# Patient Record
Sex: Male | Born: 1955 | Race: White | Hispanic: No | Smoking: Former smoker
Health system: Southern US, Community
[De-identification: ages and names within clinical notes are randomized; demographics above are authoritative.]

## PROBLEM LIST (undated history)

## (undated) DIAGNOSIS — F419 Anxiety disorder, unspecified: Secondary | ICD-10-CM

## (undated) DIAGNOSIS — I219 Acute myocardial infarction, unspecified: Secondary | ICD-10-CM

## (undated) DIAGNOSIS — C8590 Non-Hodgkin lymphoma, unspecified, unspecified site: Principal | ICD-10-CM

## (undated) DIAGNOSIS — N469 Male infertility, unspecified: Secondary | ICD-10-CM

## (undated) DIAGNOSIS — M545 Low back pain, unspecified: Secondary | ICD-10-CM

## (undated) DIAGNOSIS — I498 Other specified cardiac arrhythmias: Secondary | ICD-10-CM

## (undated) DIAGNOSIS — G43909 Migraine, unspecified, not intractable, without status migrainosus: Secondary | ICD-10-CM

## (undated) DIAGNOSIS — R296 Repeated falls: Secondary | ICD-10-CM

## (undated) DIAGNOSIS — G8929 Other chronic pain: Secondary | ICD-10-CM

## (undated) DIAGNOSIS — I10 Essential (primary) hypertension: Secondary | ICD-10-CM

## (undated) DIAGNOSIS — E559 Vitamin D deficiency, unspecified: Principal | ICD-10-CM

## (undated) DIAGNOSIS — G4733 Obstructive sleep apnea (adult) (pediatric): Secondary | ICD-10-CM

## (undated) DIAGNOSIS — I739 Peripheral vascular disease, unspecified: Secondary | ICD-10-CM

## (undated) DIAGNOSIS — M1990 Unspecified osteoarthritis, unspecified site: Secondary | ICD-10-CM

## (undated) DIAGNOSIS — R011 Cardiac murmur, unspecified: Secondary | ICD-10-CM

## (undated) DIAGNOSIS — I499 Cardiac arrhythmia, unspecified: Secondary | ICD-10-CM

## (undated) DIAGNOSIS — J309 Allergic rhinitis, unspecified: Secondary | ICD-10-CM

## (undated) DIAGNOSIS — F32A Depression, unspecified: Secondary | ICD-10-CM

## (undated) DIAGNOSIS — S069X9A Unspecified intracranial injury with loss of consciousness of unspecified duration, initial encounter: Secondary | ICD-10-CM

## (undated) DIAGNOSIS — M519 Unspecified thoracic, thoracolumbar and lumbosacral intervertebral disc disorder: Secondary | ICD-10-CM

## (undated) DIAGNOSIS — F329 Major depressive disorder, single episode, unspecified: Secondary | ICD-10-CM

## (undated) DIAGNOSIS — E785 Hyperlipidemia, unspecified: Secondary | ICD-10-CM

## (undated) DIAGNOSIS — E039 Hypothyroidism, unspecified: Secondary | ICD-10-CM

## (undated) DIAGNOSIS — F4310 Post-traumatic stress disorder, unspecified: Secondary | ICD-10-CM

## (undated) DIAGNOSIS — I2510 Atherosclerotic heart disease of native coronary artery without angina pectoris: Secondary | ICD-10-CM

## (undated) DIAGNOSIS — I779 Disorder of arteries and arterioles, unspecified: Secondary | ICD-10-CM

## (undated) DIAGNOSIS — I209 Angina pectoris, unspecified: Secondary | ICD-10-CM

## (undated) DIAGNOSIS — C8330 Diffuse large B-cell lymphoma, unspecified site: Secondary | ICD-10-CM

## (undated) DIAGNOSIS — K5730 Diverticulosis of large intestine without perforation or abscess without bleeding: Secondary | ICD-10-CM

## (undated) DIAGNOSIS — K219 Gastro-esophageal reflux disease without esophagitis: Secondary | ICD-10-CM

## (undated) HISTORY — DX: Diverticulosis of large intestine without perforation or abscess without bleeding: K57.30

## (undated) HISTORY — DX: Essential (primary) hypertension: I10

## (undated) HISTORY — DX: Post-traumatic stress disorder, unspecified: F43.10

## (undated) HISTORY — DX: Hypothyroidism, unspecified: E03.9

## (undated) HISTORY — DX: Male infertility, unspecified: N46.9

## (undated) HISTORY — PX: UMBILICAL HERNIA REPAIR: SHX196

## (undated) HISTORY — PX: SALIVARY GLAND SURGERY: SHX768

## (undated) HISTORY — PX: SHOULDER ARTHROSCOPY W/ ROTATOR CUFF REPAIR: SHX2400

## (undated) HISTORY — DX: Hyperlipidemia, unspecified: E78.5

## (undated) HISTORY — PX: RADICAL NECK DISSECTION: SHX2284

## (undated) HISTORY — DX: Low back pain: M54.5

## (undated) HISTORY — DX: Unspecified thoracic, thoracolumbar and lumbosacral intervertebral disc disorder: M51.9

## (undated) HISTORY — PX: TONSILLECTOMY: SUR1361

## (undated) HISTORY — PX: CORONARY ANGIOPLASTY WITH STENT PLACEMENT: SHX49

## (undated) HISTORY — DX: Vitamin D deficiency, unspecified: E55.9

## (undated) HISTORY — DX: Allergic rhinitis, unspecified: J30.9

## (undated) HISTORY — DX: Non-Hodgkin lymphoma, unspecified, unspecified site: C85.90

## (undated) HISTORY — DX: Low back pain, unspecified: M54.50

---
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
## 1979-08-25 HISTORY — PX: FINGER AMPUTATION: SHX636

## 1998-04-19 ENCOUNTER — Encounter: Admission: RE | Admit: 1998-04-19 | Discharge: 1998-04-19 | Payer: Self-pay | Admitting: Family Medicine

## 1998-05-03 ENCOUNTER — Encounter: Admission: RE | Admit: 1998-05-03 | Discharge: 1998-05-03 | Payer: Self-pay | Admitting: Sports Medicine

## 1998-06-02 ENCOUNTER — Encounter: Admission: RE | Admit: 1998-06-02 | Discharge: 1998-06-02 | Payer: Self-pay | Admitting: Sports Medicine

## 1998-06-23 ENCOUNTER — Encounter: Admission: RE | Admit: 1998-06-23 | Discharge: 1998-06-23 | Payer: Self-pay | Admitting: Family Medicine

## 1998-08-25 ENCOUNTER — Encounter: Admission: RE | Admit: 1998-08-25 | Discharge: 1998-08-25 | Payer: Self-pay | Admitting: Family Medicine

## 1998-09-06 ENCOUNTER — Encounter: Admission: RE | Admit: 1998-09-06 | Discharge: 1998-09-06 | Payer: Self-pay | Admitting: Family Medicine

## 1998-09-30 ENCOUNTER — Encounter: Admission: RE | Admit: 1998-09-30 | Discharge: 1998-09-30 | Payer: Self-pay | Admitting: Family Medicine

## 1999-01-12 ENCOUNTER — Encounter: Admission: RE | Admit: 1999-01-12 | Discharge: 1999-01-12 | Payer: Self-pay | Admitting: Family Medicine

## 1999-03-03 ENCOUNTER — Encounter: Admission: RE | Admit: 1999-03-03 | Discharge: 1999-03-03 | Payer: Self-pay | Admitting: Family Medicine

## 1999-04-05 ENCOUNTER — Encounter: Admission: RE | Admit: 1999-04-05 | Discharge: 1999-04-05 | Payer: Self-pay | Admitting: Family Medicine

## 1999-04-19 ENCOUNTER — Encounter: Admission: RE | Admit: 1999-04-19 | Discharge: 1999-04-19 | Payer: Self-pay | Admitting: Family Medicine

## 1999-06-09 ENCOUNTER — Encounter: Admission: RE | Admit: 1999-06-09 | Discharge: 1999-06-09 | Payer: Self-pay | Admitting: Family Medicine

## 1999-08-25 HISTORY — PX: SHOULDER ARTHROSCOPY W/ ROTATOR CUFF REPAIR: SHX2400

## 1999-09-22 ENCOUNTER — Emergency Department (HOSPITAL_COMMUNITY): Admission: EM | Admit: 1999-09-22 | Discharge: 1999-09-22 | Payer: Self-pay | Admitting: Emergency Medicine

## 1999-09-22 ENCOUNTER — Encounter: Payer: Self-pay | Admitting: Emergency Medicine

## 1999-10-02 ENCOUNTER — Encounter: Admission: RE | Admit: 1999-10-02 | Discharge: 1999-10-02 | Payer: Self-pay | Admitting: Family Medicine

## 2001-11-26 ENCOUNTER — Emergency Department (HOSPITAL_COMMUNITY): Admission: EM | Admit: 2001-11-26 | Discharge: 2001-11-26 | Payer: Self-pay | Admitting: Emergency Medicine

## 2003-07-14 ENCOUNTER — Emergency Department (HOSPITAL_COMMUNITY): Admission: EM | Admit: 2003-07-14 | Discharge: 2003-07-14 | Payer: Self-pay | Admitting: Emergency Medicine

## 2003-07-14 ENCOUNTER — Encounter: Payer: Self-pay | Admitting: Emergency Medicine

## 2004-04-14 ENCOUNTER — Ambulatory Visit (HOSPITAL_COMMUNITY): Admission: RE | Admit: 2004-04-14 | Discharge: 2004-04-14 | Payer: Self-pay | Admitting: Internal Medicine

## 2004-07-05 ENCOUNTER — Encounter: Admission: RE | Admit: 2004-07-05 | Discharge: 2004-07-05 | Payer: Self-pay | Admitting: Internal Medicine

## 2005-04-16 ENCOUNTER — Ambulatory Visit: Payer: Self-pay | Admitting: Internal Medicine

## 2005-04-24 ENCOUNTER — Ambulatory Visit: Payer: Self-pay | Admitting: Internal Medicine

## 2005-05-25 ENCOUNTER — Ambulatory Visit: Payer: Self-pay

## 2005-05-25 ENCOUNTER — Ambulatory Visit: Payer: Self-pay | Admitting: Internal Medicine

## 2005-11-05 ENCOUNTER — Ambulatory Visit: Payer: Self-pay | Admitting: Internal Medicine

## 2005-12-31 ENCOUNTER — Ambulatory Visit: Payer: Self-pay | Admitting: Internal Medicine

## 2006-02-28 ENCOUNTER — Ambulatory Visit: Payer: Self-pay | Admitting: Internal Medicine

## 2006-04-02 ENCOUNTER — Ambulatory Visit: Payer: Self-pay | Admitting: Gastroenterology

## 2006-04-12 ENCOUNTER — Ambulatory Visit: Payer: Self-pay | Admitting: Gastroenterology

## 2006-06-04 ENCOUNTER — Ambulatory Visit: Payer: Self-pay | Admitting: Internal Medicine

## 2006-06-04 ENCOUNTER — Encounter: Admission: RE | Admit: 2006-06-04 | Discharge: 2006-06-04 | Payer: Self-pay | Admitting: Internal Medicine

## 2006-08-27 ENCOUNTER — Ambulatory Visit: Payer: Self-pay | Admitting: Internal Medicine

## 2006-08-27 ENCOUNTER — Ambulatory Visit (HOSPITAL_COMMUNITY): Admission: RE | Admit: 2006-08-27 | Discharge: 2006-08-27 | Payer: Self-pay | Admitting: Internal Medicine

## 2006-11-28 ENCOUNTER — Ambulatory Visit: Payer: Self-pay | Admitting: Internal Medicine

## 2007-01-16 ENCOUNTER — Ambulatory Visit: Payer: Self-pay | Admitting: Internal Medicine

## 2007-01-16 LAB — CONVERTED CEMR LAB
MCHC: 34.6 g/dL (ref 30.0–36.0)
MCV: 84.1 fL (ref 78.0–100.0)
Platelets: 270 10*3/uL (ref 150–400)
RDW: 12.1 % (ref 11.5–14.6)
Total CK: 131 units/L (ref 7–195)

## 2007-02-27 ENCOUNTER — Ambulatory Visit: Payer: Self-pay | Admitting: Pulmonary Disease

## 2007-03-03 ENCOUNTER — Ambulatory Visit: Payer: Self-pay | Admitting: Internal Medicine

## 2007-03-19 ENCOUNTER — Ambulatory Visit: Payer: Self-pay | Admitting: Pulmonary Disease

## 2007-03-19 ENCOUNTER — Ambulatory Visit (HOSPITAL_BASED_OUTPATIENT_CLINIC_OR_DEPARTMENT_OTHER): Admission: RE | Admit: 2007-03-19 | Discharge: 2007-03-19 | Payer: Self-pay | Admitting: Pulmonary Disease

## 2007-04-08 ENCOUNTER — Ambulatory Visit: Payer: Self-pay | Admitting: Pulmonary Disease

## 2007-04-08 LAB — CONVERTED CEMR LAB
AST: 32 units/L (ref 0–37)
Albumin: 4.1 g/dL (ref 3.5–5.2)
Basophils Absolute: 0 10*3/uL (ref 0.0–0.1)
Bilirubin, Direct: 0.1 mg/dL (ref 0.0–0.3)
Chloride: 106 meq/L (ref 96–112)
Eosinophils Absolute: 0.1 10*3/uL (ref 0.0–0.6)
Eosinophils Relative: 1.7 % (ref 0.0–5.0)
Ferritin: 60.8 ng/mL (ref 22.0–322.0)
GFR calc Af Amer: 131 mL/min
GFR calc non Af Amer: 108 mL/min
Glucose, Bld: 88 mg/dL (ref 70–99)
HCT: 40.7 % (ref 39.0–52.0)
Iron: 87 ug/dL (ref 42–165)
Lymphocytes Relative: 29.7 % (ref 12.0–46.0)
MCHC: 34.1 g/dL (ref 30.0–36.0)
MCV: 86 fL (ref 78.0–100.0)
Neutro Abs: 2.9 10*3/uL (ref 1.4–7.7)
Neutrophils Relative %: 58.6 % (ref 43.0–77.0)
Platelets: 257 10*3/uL (ref 150–400)
Potassium: 4.4 meq/L (ref 3.5–5.1)
RBC: 4.73 M/uL (ref 4.22–5.81)
Saturation Ratios: 30.4 % (ref 20.0–50.0)
Sodium: 142 meq/L (ref 135–145)
TSH: 2.58 microintl units/mL (ref 0.35–5.50)
Transferrin: 204.5 mg/dL — ABNORMAL LOW (ref >=212.0)

## 2007-07-14 ENCOUNTER — Ambulatory Visit: Payer: Self-pay | Admitting: Internal Medicine

## 2007-07-23 ENCOUNTER — Encounter: Payer: Self-pay | Admitting: Internal Medicine

## 2007-07-23 DIAGNOSIS — I10 Essential (primary) hypertension: Secondary | ICD-10-CM

## 2007-07-23 DIAGNOSIS — E785 Hyperlipidemia, unspecified: Secondary | ICD-10-CM | POA: Insufficient documentation

## 2007-07-23 DIAGNOSIS — I2510 Atherosclerotic heart disease of native coronary artery without angina pectoris: Secondary | ICD-10-CM | POA: Insufficient documentation

## 2007-09-19 ENCOUNTER — Encounter: Payer: Self-pay | Admitting: Internal Medicine

## 2007-09-19 ENCOUNTER — Ambulatory Visit: Payer: Self-pay | Admitting: Internal Medicine

## 2007-09-19 LAB — CONVERTED CEMR LAB
Direct LDL: 82.7 mg/dL
Triglycerides: 219 mg/dL (ref 0–149)

## 2007-11-19 ENCOUNTER — Ambulatory Visit: Payer: Self-pay | Admitting: Internal Medicine

## 2007-11-24 ENCOUNTER — Telehealth: Payer: Self-pay | Admitting: Internal Medicine

## 2008-02-02 ENCOUNTER — Ambulatory Visit: Payer: Self-pay | Admitting: Internal Medicine

## 2008-02-02 DIAGNOSIS — I252 Old myocardial infarction: Secondary | ICD-10-CM | POA: Insufficient documentation

## 2008-02-02 DIAGNOSIS — K5730 Diverticulosis of large intestine without perforation or abscess without bleeding: Secondary | ICD-10-CM | POA: Insufficient documentation

## 2008-02-02 DIAGNOSIS — G4733 Obstructive sleep apnea (adult) (pediatric): Secondary | ICD-10-CM

## 2008-02-02 DIAGNOSIS — E039 Hypothyroidism, unspecified: Secondary | ICD-10-CM

## 2008-03-09 ENCOUNTER — Ambulatory Visit: Payer: Self-pay | Admitting: Internal Medicine

## 2008-03-18 ENCOUNTER — Ambulatory Visit: Payer: Self-pay | Admitting: Internal Medicine

## 2008-05-06 ENCOUNTER — Ambulatory Visit: Payer: Self-pay | Admitting: Internal Medicine

## 2008-05-06 DIAGNOSIS — N469 Male infertility, unspecified: Secondary | ICD-10-CM

## 2008-06-03 ENCOUNTER — Encounter: Payer: Self-pay | Admitting: Internal Medicine

## 2008-06-10 ENCOUNTER — Telehealth: Payer: Self-pay | Admitting: Internal Medicine

## 2008-09-06 ENCOUNTER — Telehealth: Payer: Self-pay | Admitting: Internal Medicine

## 2008-12-02 ENCOUNTER — Ambulatory Visit: Payer: Self-pay | Admitting: Internal Medicine

## 2008-12-16 ENCOUNTER — Telehealth: Payer: Self-pay | Admitting: Internal Medicine

## 2008-12-28 ENCOUNTER — Telehealth: Payer: Self-pay | Admitting: Internal Medicine

## 2009-01-11 ENCOUNTER — Telehealth (INDEPENDENT_AMBULATORY_CARE_PROVIDER_SITE_OTHER): Payer: Self-pay | Admitting: *Deleted

## 2009-01-13 ENCOUNTER — Encounter: Payer: Self-pay | Admitting: Internal Medicine

## 2009-01-13 ENCOUNTER — Telehealth (INDEPENDENT_AMBULATORY_CARE_PROVIDER_SITE_OTHER): Payer: Self-pay | Admitting: *Deleted

## 2009-01-20 ENCOUNTER — Telehealth: Payer: Self-pay | Admitting: Internal Medicine

## 2009-02-03 ENCOUNTER — Encounter: Payer: Self-pay | Admitting: Internal Medicine

## 2009-02-17 ENCOUNTER — Telehealth: Payer: Self-pay | Admitting: Internal Medicine

## 2009-03-08 ENCOUNTER — Ambulatory Visit: Payer: Self-pay | Admitting: Internal Medicine

## 2009-03-08 DIAGNOSIS — M2550 Pain in unspecified joint: Secondary | ICD-10-CM

## 2009-03-08 DIAGNOSIS — M25529 Pain in unspecified elbow: Secondary | ICD-10-CM

## 2009-03-08 DIAGNOSIS — M545 Low back pain: Secondary | ICD-10-CM | POA: Insufficient documentation

## 2009-03-08 DIAGNOSIS — M25569 Pain in unspecified knee: Secondary | ICD-10-CM | POA: Insufficient documentation

## 2009-03-08 LAB — CONVERTED CEMR LAB
AST: 24 units/L (ref 0–37)
Albumin: 4.3 g/dL (ref 3.5–5.2)
Alkaline Phosphatase: 75 units/L (ref 39–117)
Basophils Relative: 0.6 % (ref 0.0–3.0)
CO2: 28 meq/L (ref 19–32)
Calcium: 9 mg/dL (ref 8.4–10.5)
Chloride: 108 meq/L (ref 96–112)
Eosinophils Absolute: 0.1 10*3/uL (ref 0.0–0.7)
Glucose, Bld: 97 mg/dL (ref 70–99)
HCT: 41.4 % (ref 39.0–52.0)
Hemoglobin: 14.5 g/dL (ref 13.0–17.0)
Lymphocytes Relative: 27.3 % (ref 12.0–46.0)
Lymphs Abs: 1.6 10*3/uL (ref 0.7–4.0)
MCHC: 35.1 g/dL (ref 30.0–36.0)
Neutro Abs: 3.5 10*3/uL (ref 1.4–7.7)
Potassium: 4.3 meq/L (ref 3.5–5.1)
RBC: 4.68 M/uL (ref 4.22–5.81)
RDW: 12 % (ref 11.5–14.6)
Rhuematoid fact SerPl-aCnc: <20 intl units/mL (ref 0.0–20.0)
Sodium: 143 meq/L (ref 135–145)
TSH: 4.08 microintl units/mL (ref 0.35–5.50)
Total Protein: 7.1 g/dL (ref 6.0–8.3)

## 2009-08-09 ENCOUNTER — Ambulatory Visit: Payer: Self-pay | Admitting: Cardiology

## 2009-08-09 ENCOUNTER — Ambulatory Visit: Payer: Self-pay | Admitting: Internal Medicine

## 2009-12-19 ENCOUNTER — Ambulatory Visit: Payer: Self-pay | Admitting: Internal Medicine

## 2009-12-19 DIAGNOSIS — J309 Allergic rhinitis, unspecified: Secondary | ICD-10-CM

## 2009-12-21 ENCOUNTER — Encounter (INDEPENDENT_AMBULATORY_CARE_PROVIDER_SITE_OTHER): Payer: Self-pay | Admitting: *Deleted

## 2009-12-21 ENCOUNTER — Telehealth (INDEPENDENT_AMBULATORY_CARE_PROVIDER_SITE_OTHER): Payer: Self-pay | Admitting: *Deleted

## 2009-12-26 ENCOUNTER — Telehealth (INDEPENDENT_AMBULATORY_CARE_PROVIDER_SITE_OTHER): Payer: Self-pay | Admitting: *Deleted

## 2009-12-29 ENCOUNTER — Telehealth: Payer: Self-pay | Admitting: Internal Medicine

## 2010-01-02 ENCOUNTER — Telehealth: Payer: Self-pay | Admitting: Internal Medicine

## 2010-01-09 ENCOUNTER — Telehealth: Payer: Self-pay | Admitting: Internal Medicine

## 2010-01-09 ENCOUNTER — Ambulatory Visit: Payer: Self-pay | Admitting: Internal Medicine

## 2010-01-09 LAB — CONVERTED CEMR LAB
Calcium: 9.2 mg/dL (ref 8.4–10.5)
Creatinine, Ser: 0.8 mg/dL (ref 0.4–1.5)
Direct LDL: 93 mg/dL
HDL: 35.5 mg/dL — ABNORMAL LOW (ref >39.00)
Sodium: 143 meq/L (ref 135–145)
TSH: 4 microintl units/mL (ref 0.35–5.50)
Total CHOL/HDL Ratio: 5
Triglycerides: 361 mg/dL — ABNORMAL HIGH (ref 0.0–149.0)

## 2010-01-16 ENCOUNTER — Encounter: Payer: Self-pay | Admitting: Internal Medicine

## 2010-05-29 ENCOUNTER — Ambulatory Visit: Payer: Self-pay | Admitting: Internal Medicine

## 2010-05-29 LAB — CONVERTED CEMR LAB
AST: 31 units/L (ref 0–37)
Albumin: 4.5 g/dL (ref 3.5–5.2)
Alkaline Phosphatase: 84 units/L (ref 39–117)
Basophils Absolute: 0 10*3/uL (ref 0.0–0.1)
Basophils Relative: 0.4 % (ref 0.0–3.0)
Calcium: 9.2 mg/dL (ref 8.4–10.5)
Eosinophils Relative: 1.9 % (ref 0.0–5.0)
GFR calc non Af Amer: 96.99 mL/min (ref >60)
Hemoglobin: 14.4 g/dL (ref 13.0–17.0)
Lymphocytes Relative: 27.8 % (ref 12.0–46.0)
Monocytes Relative: 9.2 % (ref 3.0–12.0)
Neutro Abs: 3.5 10*3/uL (ref 1.4–7.7)
RBC: 4.74 M/uL (ref 4.22–5.81)
RDW: 13.4 % (ref 11.5–14.6)
Sodium: 143 meq/L (ref 135–145)
WBC: 5.8 10*3/uL (ref 4.5–10.5)

## 2010-05-31 ENCOUNTER — Telehealth: Payer: Self-pay | Admitting: Internal Medicine

## 2010-05-31 ENCOUNTER — Ambulatory Visit: Payer: Self-pay | Admitting: Cardiology

## 2010-05-31 DIAGNOSIS — I6529 Occlusion and stenosis of unspecified carotid artery: Secondary | ICD-10-CM

## 2010-06-01 ENCOUNTER — Telehealth (INDEPENDENT_AMBULATORY_CARE_PROVIDER_SITE_OTHER): Payer: Self-pay | Admitting: *Deleted

## 2010-06-05 ENCOUNTER — Encounter: Payer: Self-pay | Admitting: Internal Medicine

## 2010-06-05 ENCOUNTER — Other Ambulatory Visit: Admission: RE | Admit: 2010-06-05 | Discharge: 2010-06-05 | Payer: Self-pay | Admitting: Otolaryngology

## 2010-06-07 ENCOUNTER — Telehealth: Payer: Self-pay | Admitting: Internal Medicine

## 2010-06-09 ENCOUNTER — Encounter: Payer: Self-pay | Admitting: Internal Medicine

## 2010-06-12 ENCOUNTER — Telehealth: Payer: Self-pay | Admitting: Internal Medicine

## 2010-06-13 ENCOUNTER — Ambulatory Visit: Payer: Self-pay

## 2010-06-13 ENCOUNTER — Encounter: Payer: Self-pay | Admitting: Internal Medicine

## 2010-06-13 ENCOUNTER — Telehealth: Payer: Self-pay | Admitting: Internal Medicine

## 2010-06-16 ENCOUNTER — Telehealth: Payer: Self-pay | Admitting: Internal Medicine

## 2010-06-20 ENCOUNTER — Encounter: Payer: Self-pay | Admitting: Internal Medicine

## 2010-06-28 ENCOUNTER — Telehealth (INDEPENDENT_AMBULATORY_CARE_PROVIDER_SITE_OTHER): Payer: Self-pay | Admitting: *Deleted

## 2010-07-18 ENCOUNTER — Ambulatory Visit: Payer: Self-pay | Admitting: Internal Medicine

## 2010-07-18 ENCOUNTER — Inpatient Hospital Stay (HOSPITAL_COMMUNITY): Admission: AD | Admit: 2010-07-18 | Discharge: 2010-07-28 | Payer: Self-pay | Admitting: Internal Medicine

## 2010-07-23 ENCOUNTER — Ambulatory Visit: Payer: Self-pay | Admitting: Infectious Disease

## 2010-07-24 HISTORY — PX: DEEP NECK LYMPH NODE BIOPSY / EXCISION: SUR126

## 2010-07-26 ENCOUNTER — Encounter: Payer: Self-pay | Admitting: Internal Medicine

## 2010-07-31 ENCOUNTER — Encounter: Payer: Self-pay | Admitting: Internal Medicine

## 2010-08-07 ENCOUNTER — Encounter: Payer: Self-pay | Admitting: Internal Medicine

## 2010-08-23 ENCOUNTER — Encounter: Payer: Self-pay | Admitting: Internal Medicine

## 2010-09-04 ENCOUNTER — Ambulatory Visit: Payer: Self-pay | Admitting: Internal Medicine

## 2010-09-04 DIAGNOSIS — M5137 Other intervertebral disc degeneration, lumbosacral region: Secondary | ICD-10-CM

## 2010-09-04 DIAGNOSIS — K4090 Unilateral inguinal hernia, without obstruction or gangrene, not specified as recurrent: Secondary | ICD-10-CM

## 2010-09-04 DIAGNOSIS — F4310 Post-traumatic stress disorder, unspecified: Secondary | ICD-10-CM | POA: Insufficient documentation

## 2010-09-06 ENCOUNTER — Encounter: Payer: Self-pay | Admitting: Internal Medicine

## 2010-09-07 ENCOUNTER — Encounter: Payer: Self-pay | Admitting: Internal Medicine

## 2010-09-07 ENCOUNTER — Ambulatory Visit (HOSPITAL_COMMUNITY): Admission: RE | Admit: 2010-09-07 | Discharge: 2010-09-07 | Payer: Self-pay | Admitting: Otolaryngology

## 2010-09-13 ENCOUNTER — Encounter: Payer: Self-pay | Admitting: Internal Medicine

## 2010-09-15 ENCOUNTER — Ambulatory Visit: Payer: Self-pay | Admitting: Internal Medicine

## 2010-09-22 ENCOUNTER — Ambulatory Visit: Payer: Self-pay | Admitting: Internal Medicine

## 2010-10-03 ENCOUNTER — Ambulatory Visit: Payer: Self-pay | Admitting: Cardiology

## 2010-10-03 ENCOUNTER — Ambulatory Visit (HOSPITAL_COMMUNITY): Admission: RE | Admit: 2010-10-03 | Discharge: 2010-10-03 | Payer: Self-pay

## 2010-10-03 ENCOUNTER — Ambulatory Visit: Payer: Self-pay

## 2010-10-03 ENCOUNTER — Encounter: Payer: Self-pay | Admitting: Internal Medicine

## 2010-10-05 ENCOUNTER — Encounter: Payer: Self-pay | Admitting: Internal Medicine

## 2010-10-10 ENCOUNTER — Ambulatory Visit (HOSPITAL_COMMUNITY): Admission: RE | Admit: 2010-10-10 | Discharge: 2010-10-10 | Payer: Self-pay | Admitting: Otolaryngology

## 2010-10-12 ENCOUNTER — Encounter: Payer: Self-pay | Admitting: Internal Medicine

## 2010-10-18 ENCOUNTER — Telehealth (INDEPENDENT_AMBULATORY_CARE_PROVIDER_SITE_OTHER): Payer: Self-pay | Admitting: *Deleted

## 2010-10-19 ENCOUNTER — Encounter: Payer: Self-pay | Admitting: Internal Medicine

## 2010-10-19 ENCOUNTER — Inpatient Hospital Stay (HOSPITAL_COMMUNITY): Admission: RE | Admit: 2010-10-19 | Discharge: 2010-10-24 | Payer: Self-pay | Admitting: Otolaryngology

## 2010-10-19 ENCOUNTER — Encounter (INDEPENDENT_AMBULATORY_CARE_PROVIDER_SITE_OTHER): Payer: Self-pay | Admitting: Otolaryngology

## 2010-10-19 ENCOUNTER — Telehealth: Payer: Self-pay | Admitting: Internal Medicine

## 2010-10-24 ENCOUNTER — Ambulatory Visit: Payer: Self-pay | Admitting: Oncology

## 2010-10-25 ENCOUNTER — Encounter: Payer: Self-pay | Admitting: Internal Medicine

## 2010-10-25 LAB — CBC WITH DIFFERENTIAL/PLATELET
BASO%: 0.2 % (ref 0.0–2.0)
EOS%: 1.1 % (ref 0.0–7.0)
HCT: 37.5 % — ABNORMAL LOW (ref 38.4–49.9)
LYMPH%: 16.6 % (ref 14.0–49.0)
MCH: 29.4 pg (ref 27.2–33.4)
MCHC: 33.8 g/dL (ref 32.0–36.0)
MCV: 87 fL (ref 79.3–98.0)
MONO%: 7.9 % (ref 0.0–14.0)
NEUT%: 74.2 % (ref 39.0–75.0)
lymph#: 1.3 10*3/uL (ref 0.9–3.3)

## 2010-10-26 ENCOUNTER — Ambulatory Visit
Admission: RE | Admit: 2010-10-26 | Discharge: 2010-11-21 | Payer: Self-pay | Source: Home / Self Care | Admitting: Radiation Oncology

## 2010-10-26 LAB — HEPATITIS B SURFACE ANTIBODY,QUALITATIVE: Hep B S Ab: NEGATIVE

## 2010-10-26 LAB — COMPREHENSIVE METABOLIC PANEL
ALT: 26 U/L (ref 0–53)
AST: 26 U/L (ref 0–37)
Alkaline Phosphatase: 98 U/L (ref 39–117)
BUN: 7 mg/dL (ref 6–23)
Chloride: 100 mEq/L (ref 96–112)
Creatinine, Ser: 0.77 mg/dL (ref 0.40–1.50)
Total Bilirubin: 0.3 mg/dL (ref 0.3–1.2)

## 2010-10-26 LAB — BETA 2 MICROGLOBULIN, SERUM: Beta-2 Microglobulin: 1.68 mg/L (ref 1.01–1.73)

## 2010-10-26 LAB — HEPATITIS B SURFACE ANTIGEN: Hepatitis B Surface Ag: NEGATIVE

## 2010-10-26 LAB — HEPATITIS B CORE ANTIBODY, IGM: Hep B C IgM: NEGATIVE

## 2010-10-30 ENCOUNTER — Ambulatory Visit (HOSPITAL_COMMUNITY): Admission: RE | Admit: 2010-10-30 | Discharge: 2010-10-30 | Payer: Self-pay | Admitting: Otolaryngology

## 2010-10-31 ENCOUNTER — Encounter: Payer: Self-pay | Admitting: Internal Medicine

## 2010-11-01 ENCOUNTER — Ambulatory Visit: Payer: Self-pay | Admitting: Internal Medicine

## 2010-11-01 DIAGNOSIS — C8581 Other specified types of non-Hodgkin lymphoma, lymph nodes of head, face, and neck: Secondary | ICD-10-CM

## 2010-11-03 ENCOUNTER — Telehealth: Payer: Self-pay | Admitting: Internal Medicine

## 2010-11-06 ENCOUNTER — Ambulatory Visit (HOSPITAL_COMMUNITY): Admission: RE | Admit: 2010-11-06 | Discharge: 2010-11-06 | Payer: Self-pay | Admitting: Oncology

## 2010-11-07 ENCOUNTER — Ambulatory Visit (HOSPITAL_COMMUNITY): Admission: RE | Admit: 2010-11-07 | Discharge: 2010-11-07 | Payer: Self-pay | Admitting: Oncology

## 2010-11-08 ENCOUNTER — Encounter: Payer: Self-pay | Admitting: Internal Medicine

## 2010-11-08 LAB — COMPREHENSIVE METABOLIC PANEL
ALT: 17 U/L (ref 0–53)
Albumin: 3.8 g/dL (ref 3.5–5.2)
Alkaline Phosphatase: 83 U/L (ref 39–117)
Glucose, Bld: 96 mg/dL (ref 70–99)
Potassium: 4.4 mEq/L (ref 3.5–5.3)
Sodium: 141 mEq/L (ref 135–145)
Total Protein: 6.4 g/dL (ref 6.0–8.3)

## 2010-11-08 LAB — CBC WITH DIFFERENTIAL/PLATELET
Basophils Absolute: 0 10*3/uL (ref 0.0–0.1)
EOS%: 1.7 % (ref 0.0–7.0)
Eosinophils Absolute: 0.1 10*3/uL (ref 0.0–0.5)
HGB: 12.6 g/dL — ABNORMAL LOW (ref 13.0–17.1)
MCH: 28.6 pg (ref 27.2–33.4)
NEUT#: 4 10*3/uL (ref 1.5–6.5)
RDW: 13.2 % (ref 11.0–14.6)
lymph#: 1.1 10*3/uL (ref 0.9–3.3)

## 2010-11-14 ENCOUNTER — Encounter: Payer: Self-pay | Admitting: Internal Medicine

## 2010-11-20 ENCOUNTER — Encounter
Admission: RE | Admit: 2010-11-20 | Discharge: 2010-12-14 | Payer: Self-pay | Source: Home / Self Care | Attending: Oncology | Admitting: Oncology

## 2010-11-27 ENCOUNTER — Ambulatory Visit: Payer: Self-pay | Admitting: Oncology

## 2010-11-29 ENCOUNTER — Encounter: Payer: Self-pay | Admitting: Internal Medicine

## 2010-11-29 LAB — CBC WITH DIFFERENTIAL/PLATELET
Basophils Absolute: 0 10*3/uL (ref 0.0–0.1)
Eosinophils Absolute: 0 10*3/uL (ref 0.0–0.5)
HCT: 37.9 % — ABNORMAL LOW (ref 38.4–49.9)
HGB: 12.7 g/dL — ABNORMAL LOW (ref 13.0–17.1)
LYMPH%: 20.1 % (ref 14.0–49.0)
MCV: 84.2 fL (ref 79.3–98.0)
MONO#: 0.8 10*3/uL (ref 0.1–0.9)
MONO%: 13.8 % (ref 0.0–14.0)
NEUT#: 3.6 10*3/uL (ref 1.5–6.5)
NEUT%: 64.9 % (ref 39.0–75.0)
Platelets: 226 10*3/uL (ref 140–400)
WBC: 5.6 10*3/uL (ref 4.0–10.3)

## 2010-11-29 LAB — COMPREHENSIVE METABOLIC PANEL
ALT: 31 U/L (ref 0–53)
AST: 25 U/L (ref 0–37)
Alkaline Phosphatase: 105 U/L (ref 39–117)
BUN: 9 mg/dL (ref 6–23)
Calcium: 9.3 mg/dL (ref 8.4–10.5)
Chloride: 106 mEq/L (ref 96–112)
Creatinine, Ser: 0.72 mg/dL (ref 0.40–1.50)
Total Bilirubin: 0.3 mg/dL (ref 0.3–1.2)

## 2010-12-07 ENCOUNTER — Ambulatory Visit (HOSPITAL_COMMUNITY)
Admission: RE | Admit: 2010-12-07 | Discharge: 2010-12-07 | Payer: Self-pay | Source: Home / Self Care | Attending: Otolaryngology | Admitting: Otolaryngology

## 2010-12-07 ENCOUNTER — Encounter (INDEPENDENT_AMBULATORY_CARE_PROVIDER_SITE_OTHER): Payer: Self-pay | Admitting: Otolaryngology

## 2010-12-11 ENCOUNTER — Telehealth: Payer: Self-pay | Admitting: Internal Medicine

## 2010-12-20 ENCOUNTER — Encounter: Payer: Self-pay | Admitting: Internal Medicine

## 2010-12-20 LAB — COMPREHENSIVE METABOLIC PANEL
ALT: 28 U/L (ref 0–53)
Albumin: 4.1 g/dL (ref 3.5–5.2)
Alkaline Phosphatase: 91 U/L (ref 39–117)
CO2: 26 mEq/L (ref 19–32)
Glucose, Bld: 127 mg/dL — ABNORMAL HIGH (ref 70–99)
Potassium: 4.2 mEq/L (ref 3.5–5.3)
Sodium: 140 mEq/L (ref 135–145)
Total Protein: 6.5 g/dL (ref 6.0–8.3)

## 2010-12-20 LAB — CBC WITH DIFFERENTIAL/PLATELET
BASO%: 0.6 % (ref 0.0–2.0)
HCT: 38.6 % (ref 38.4–49.9)
LYMPH%: 13.4 % — ABNORMAL LOW (ref 14.0–49.0)
MCHC: 33.4 g/dL (ref 32.0–36.0)
MCV: 84.8 fL (ref 79.3–98.0)
MONO#: 0.8 10*3/uL (ref 0.1–0.9)
MONO%: 12.2 % (ref 0.0–14.0)
NEUT%: 72.3 % (ref 39.0–75.0)
Platelets: 195 10*3/uL (ref 140–400)
RBC: 4.55 10*6/uL (ref 4.20–5.82)

## 2010-12-20 LAB — URIC ACID: Uric Acid, Serum: 5 mg/dL (ref 4.0–7.8)

## 2010-12-22 ENCOUNTER — Telehealth (INDEPENDENT_AMBULATORY_CARE_PROVIDER_SITE_OTHER): Payer: Self-pay | Admitting: *Deleted

## 2010-12-29 ENCOUNTER — Encounter: Payer: Self-pay | Admitting: Internal Medicine

## 2011-01-02 ENCOUNTER — Ambulatory Visit
Admission: RE | Admit: 2011-01-02 | Discharge: 2011-01-02 | Payer: Self-pay | Source: Home / Self Care | Attending: Internal Medicine | Admitting: Internal Medicine

## 2011-01-08 ENCOUNTER — Ambulatory Visit (HOSPITAL_COMMUNITY)
Admission: RE | Admit: 2011-01-08 | Discharge: 2011-01-08 | Payer: Self-pay | Source: Home / Self Care | Attending: Otolaryngology | Admitting: Otolaryngology

## 2011-01-09 ENCOUNTER — Ambulatory Visit
Admission: RE | Admit: 2011-01-09 | Discharge: 2011-01-23 | Payer: Self-pay | Source: Home / Self Care | Attending: Radiation Oncology | Admitting: Radiation Oncology

## 2011-01-12 ENCOUNTER — Other Ambulatory Visit: Payer: Self-pay | Admitting: Oncology

## 2011-01-12 DIAGNOSIS — C8590 Non-Hodgkin lymphoma, unspecified, unspecified site: Secondary | ICD-10-CM

## 2011-01-23 NOTE — Letter (Signed)
Summary: [facility]  [facility]   Imported By: Sherian Rein 11/03/2010 08:14:13  _____________________________________________________________________  External Attachment:    Type:   Image     Comment:   External Document

## 2011-01-23 NOTE — Assessment & Plan Note (Signed)
Summary: suture removal per men, per men ami to removed sutures/#/cd   Vital Signs:  Patient profile:   55 year old male Height:      72 inches Weight:      275 pounds BMI:     37.43 O2 Sat:      97 % on Room air Temp:     97.5 degrees F oral Pulse rate:   72 / minute BP sitting:   144 / 74  (left arm) Cuff size:   regular  Vitals Entered By: Bill Salinas CMA (September 22, 2010 1:35 PM)  O2 Flow:  Room air CC: pt here for suture removal, pt was brought back and I  removed 6 superficial suture from the pt's upper back/ ab   Primary Care Keaja Reaume:  Norins  CC:  pt here for suture removal and pt was brought back and I  removed 6 superficial suture from the pt's upper back/ ab.  History of Present Illness: Patient presents for suture removal after excision of sebaceous cyst from his back. The wound has health nicely with no complications.  He does c/o decreased exercise which he attributes to being overweight. He reports that when playing with his grandson he gets to exhausted and short of breath that he must stop and rest. His recovery time is only a few minutes. Chart reviewed: last stress test in '04 with significant wall motion abnormality and estimated EF 45%. His last cardiology visit was surgical clearance in '06.  Current Medications (verified): 1)  Vytorin 10-40 Mg  Tabs (Ezetimibe-Simvastatin) .... At Bedtime 2)  Synthroid 100 Mcg  Tabs (Levothyroxine Sodium) .... Once Daily 3)  Oxycodone Hcl 5 Mg Tabs (Oxycodone Hcl) .Marland Kitchen.. 1 - 2 By Mouth Q 6 Hrs As Needed 4)  Budeprion Sr 150 Mg Xr12h-Tab (Bupropion Hcl) .... 2 Qdtab 5)  Trazodone Hcl 50 Mg  Tabs (Trazodone Hcl) .Marland Kitchen.. 1 or 2 By Mouth At Bedtime 6)  Citalopram Hydrobromide 20 Mg Tabs (Citalopram Hydrobromide) .Marland Kitchen.. 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) ACUTE LYMPHADENITIS (ICD-683) ALLERGIC RHINITIS CAUSE UNSPECIFIED (ICD-477.9) HEADACHE (ICD-784.0) FATIGUE (ICD-780.79) DYSPNEA (ICD-786.05) LOW BACK PAIN (ICD-724.2) ELBOW PAIN, RIGHT (ICD-719.42) KNEE PAIN, BILATERAL (ICD-719.46) POLYARTHRITIS (ICD-719.49) MALE INFERTILITY (ICD-606.9) OTHER DISEASES OF NASAL CAVITY AND SINUSES (ICD-478.19) POSTTRAUMATIC STRESS DISORDER (ICD-309.81) DIVERTICULOSIS, COLON (ICD-562.10) FAMILY HISTORY OF ALCOHOLISM/ADDICTION (ICD-V61.41) HYPOTHYROIDISM (ICD-244.9) MYOCARDIAL INFARCTION, HX OF (ICD-412) OBSTRUCTIVE SLEEP APNEA (ICD-327.23) GRIEF REACTION, ACUTE (ICD-309.0) HYPERTENSION (ICD-401.9) HYPERLIPIDEMIA (ICD-272.4) CORONARY ARTERY DISEASE (ICD-414.00)    Physician Roster:               ENT- Dr. Lazarus Salines               Pul - Dr. Craige Cotta  Past Surgical History: Last updated: 09/04/2010 Rotator cuff repair PTCB stent Inguinal herniorrhaphy I&D right neck for lympadenitis Aug '11   Family History: Last updated: 02/02/2008 mother with heart disease and breast cancer Family History of Alcoholism/Addiction  Social History: Last updated: 09/04/2010 [employer] - under 2 years - PTSD lives with SO. Just lost his son. Close to his 2 y/o grand-daughter Marriage is strained by loss Alcohol use-no Drug use-no Regular exercise-no  Review of Systems       The patient complains of weight gain, dyspnea on exertion, and enlarged lymph nodes.  The patient denies anorexia, weight loss, chest  pain, syncope, peripheral edema, abdominal pain, severe indigestion/heartburn, muscle weakness, and difficulty walking.         continued problem with enlargement and tenderness right submanibular region.   Physical Exam  General:  alert, well-developed, well-nourished, and well-hydrated.   Head:  normocephalic and atraumatic.   Neck:  swelling in the right submaniblar region with continue tenderness. Chest Wall:  no  deformities.   Lungs:  normal respiratory effort, normal breath sounds, no crackles, and no wheezes.   Heart:  normal rate, regular rhythm, and no rub.   Msk:  normal ROM.   Neurologic:  alert & oriented X3, cranial nerves II-XII intact, and gait normal.   Skin:  well healed wound from sebaceous cyst removal right upper back (tatoo lines aligned). Cervical Nodes:  enalrged and tender area of right submandibular, anterior cervical lymph nodes. Psych:  Oriented X3, normally interactive, and good eye contact.     Impression & Recommendations:  Problem # 1:  SEBACEOUS CYST (ICD-706.2) Healing nicely. Sutures removed  Problem # 2:  ACUTE LYMPHADENITIS (ICD-683) Recurrent and persistent lymphadenitis. He is followed closely by Dr. Lazarus Salines and is scheduled for surgical exicisonal biopsy of persistently enlarged node. He is now having a recurrent flare.  Plan - he is advised to contact Dr. Raye Sorrow office.  Problem # 3:  CORONARY ARTERY DISEASE (ICD-414.00) Patient with h/o CAD/MI and a cardiomyopathy at last testing in '04 now presenting with decreased exercise tolerance but not chest pain.  PLan - schedule for 2D echo to assess LV fxn - r/o progressive cardiomyopathy.             Further eval to be based on findings.              He is given precautions to report for any chest pain or discomfort or change in condition.  Orders: Cardiology Referral (Cardiology)  Complete Medication List: 1)  Vytorin 10-40 Mg Tabs (Ezetimibe-simvastatin) .... At bedtime 2)  Synthroid 100 Mcg Tabs (Levothyroxine sodium) .... Once daily 3)  Oxycodone Hcl 5 Mg Tabs (Oxycodone hcl) .Marland Kitchen.. 1 - 2 by mouth q 6 hrs as needed 4)  Budeprion Sr 150 Mg Xr12h-tab (Bupropion hcl) .... 2 qdtab 5)  Trazodone Hcl 50 Mg Tabs (Trazodone hcl) .Marland Kitchen.. 1 or 2 by mouth at bedtime 6)  Citalopram Hydrobromide 20 Mg Tabs (Citalopram hydrobromide) .Marland Kitchen.. 1 by mouth once daily

## 2011-01-23 NOTE — Letter (Signed)
Summary: [facility]  [facility]   Imported By: Lester [place] 08/29/2010 12:44:07  _____________________________________________________________________  External Attachment:    Type:   Image     Comment:   External Document

## 2011-01-23 NOTE — Assessment & Plan Note (Signed)
Summary: TICK BITE 5-25/ LYMPTH NODE IS STILL SWOLLEN/NWS   Vital Signs:  Patient profile:   55 year old male Height:      72 inches Weight:      266.25 pounds BMI:     36.24 O2 Sat:      97 % on Room air Temp:     97.1 degrees F oral Pulse rate:   63 / minute Pulse rhythm:   regular Resp:     16 per minute BP sitting:   142 / 80  (left arm) Cuff size:   large  Vitals Entered By: Rock Nephew CMA (May 29, 2010 9:20 AM)  Nutrition Counseling: Patient's BMI is greater than 25 and therefore counseled on weight management options.  O2 Flow:  Room air CC: tick bite 05/17/10 w/ swollen lymph Is Patient Diabetic? No Pain Assessment Patient in pain? no        Primary Care Provider:  Norins  CC:  tick bite 05/17/10 w/ swollen lymph.  History of Present Illness: New to me he complains of an enlarged mass on the right side of his neck/jaw for several weeks. There is rarely discomfort associated with this area but he does have an increased sense of salivation on the right side of his mouth. This associated with three tick bites on the neck, right leg, and left lower abd- two were medium sized ticks but one was a very small tick. There are no symptoms or rash in the areas of the tick bites.  Preventive Screening-Counseling & Management  Alcohol-Tobacco     Alcohol drinks/day: 0     >5/day in last 3 mos: no     Alcohol Counseling: not indicated; patient does not drink     Smoking Status: quit > 6 months     Smoking Cessation Counseling: no     Smoke Cessation Stage: quit     Packs/Day: 1.5     Year Started: 1969     Year Quit: 2001     Pack years: 40     Tobacco Counseling: to remain off tobacco products  Caffeine-Diet-Exercise     Does Patient Exercise: no      Sexual History:  currently monogamous.        Drug Use:  no.    Current Medications (verified): 1)  Vytorin 10-40 Mg  Tabs (Ezetimibe-Simvastatin) .... At Bedtime 2)  Synthroid 100 Mcg  Tabs (Levothyroxine  Sodium) .... Once Daily 3)  Oxycodone Hcl 5 Mg Tabs (Oxycodone Hcl) .Marland Kitchen.. 1 - 2 By Mouth Q 6 Hrs As Needed 4)  Budeprion Sr 150 Mg Xr12h-Tab (Bupropion Hcl) .... 2 Qdtab 5)  Trazodone Hcl 50 Mg  Tabs (Trazodone Hcl) .Marland Kitchen.. 1 or 2 By Mouth At Bedtime 6)  Citalopram Hydrobromide 20 Mg Tabs (Citalopram Hydrobromide) .Marland Kitchen.. 1 By Mouth Once Daily  Allergies (verified): No Known Drug Allergies  Past History:  Past Medical History: Reviewed history from 03/08/2009 and no changes required. Coronary artery disease Depression with ? paranoid schizophrenia in the past Hyperlipidemia Hypertension OSA Myocardial infarction, hx of Hypothyroidism ? FMS hx of nasal fx hx of skull fx hx of wrist fx hx of traumatic vetebral fx Diverticulosis, colon Anxiety hx of lumbar disc disease Low back pain - chronic  Past Surgical History: Reviewed history from 02/02/2008 and no changes required. Rotator cuff repair PTCB stent Inguinal herniorrhaphy  Family History: Reviewed history from 02/02/2008 and no changes required. mother with heart disease and breast cancer Family History of  Alcoholism/Addiction  Social History: Reviewed history from 02/02/2008 and no changes required. lives with SO. Just lost his son. Close to his 2 y/o grand-daughter Marriage is strained by loss Alcohol use-no Drug use-no Regular exercise-no Smoking Status:  quit > 6 months Packs/Day:  1.5 Sexual History:  currently monogamous Drug Use:  no Does Patient Exercise:  no  Review of Systems       The patient complains of enlarged lymph nodes.  The patient denies anorexia, fever, weight loss, weight gain, decreased hearing, hoarseness, chest pain, syncope, dyspnea on exertion, peripheral edema, prolonged cough, headaches, hemoptysis, abdominal pain, suspicious skin lesions, abnormal bleeding, and angioedema.   General:  Complains of sweats; denies chills, fatigue, fever, loss of appetite, malaise, sleep disorder, and  weight loss. ENT:  Denies decreased hearing, difficulty swallowing, ear discharge, earache, hoarseness, nasal congestion, nosebleeds, postnasal drainage, ringing in ears, sinus pressure, and sore throat. Resp:  Denies chest pain with inspiration, cough, coughing up blood, morning headaches, pleuritic, shortness of breath, sputum productive, and wheezing. Derm:  Complains of poor wound healing; denies changes in color of skin, changes in nail beds, dryness, excessive perspiration, flushing, hair loss, insect bite(s), itching, lesion(s), and rash. Endo:  Denies cold intolerance, excessive hunger, excessive thirst, excessive urination, heat intolerance, polyuria, and weight change. Heme:  Complains of enlarge lymph nodes; denies abnormal bruising, bleeding, fevers, pallor, and skin discoloration.  Physical Exam  General:  alert, well-developed, well-nourished, well-hydrated, appropriate dress, normal appearance, healthy-appearing, cooperative to examination, and overweight-appearing.   Head:  normocephalic, atraumatic, no abnormalities observed, no abnormalities palpated, and no alopecia.   Eyes:  vision grossly intact, pupils equal, pupils round, pupils reactive to light, and no injection.   Ears:  R ear normal and L ear normal.   Nose:  External nasal examination shows no deformity or inflammation. Nasal mucosa are pink and moist without lesions or exudates. Mouth:  Oral mucosa and oropharynx without lesions or exudates.  Teeth in good repair. Neck:  he has a firm, fixed mass that measures about 2 cm in the right posterior mandibular area, it is non-tender, non-fluctuant, and feel like the consistency of a lymph node vs salivary gland but it has atypical features as well. supple, full ROM, no thyromegaly, no thyroid nodules or tenderness, no JVD, normal carotid upstroke, no carotid bruits, no neck tenderness, and R neck mass.   Lungs:  normal respiratory effort, no intercostal retractions, no  accessory muscle use, normal breath sounds, no dullness, no fremitus, no crackles, and no wheezes.   Heart:  normal rate, regular rhythm, no murmur, no gallop, no rub, and no JVD.   Abdomen:  soft, non-tender, normal bowel sounds, no distention, no masses, no guarding, no rigidity, no rebound tenderness, no abdominal hernia, no inguinal hernia, no hepatomegaly, and no splenomegaly.   Msk:  normal ROM, no joint tenderness, no joint swelling, no joint warmth, no redness over joints, no joint deformities, no joint instability, no crepitation, and no muscle atrophy.   Pulses:  R and L carotid,radial,femoral,dorsalis pedis and posterior tibial pulses are full and equal bilaterally Extremities:  No clubbing, cyanosis, edema, or deformity noted with normal full range of motion of all joints.   Neurologic:  No cranial nerve deficits noted. Station and gait are normal. Plantar reflexes are down-going bilaterally. DTRs are symmetrical throughout. Sensory, motor and coordinative functions appear intact. Skin:  turgor normal, color normal, no rashes, no suspicious lesions, no ecchymoses, no petechiae, no purpura, no ulcerations, and no  edema.  there is no ECM and the sites of tick bite are unremarkable Axillary Nodes:  no R axillary adenopathy and no L axillary adenopathy.   Inguinal Nodes:  no R inguinal adenopathy and no L inguinal adenopathy.   Psych:  Cognition and judgment appear intact. Alert and cooperative with normal attention span and concentration. No apparent delusions, illusions, hallucinations   Impression & Recommendations:  Problem # 1:  ACUTE LYMPHADENITIS (ICD-683)  His updated medication list for this problem includes:    Doxycycline Hyclate 100 Mg Tabs (Doxycycline hyclate) ..... One by mouth two times a day for 14 days  Orders: Venipuncture (16109) TLB-BMP (Basic Metabolic Panel-BMET) (80048-METABOL) TLB-CBC Platelet - w/Differential (85025-CBCD) TLB-Hepatic/Liver Function Pnl  (80076-HEPATIC) TLB-CRP-High Sensitivity (C-Reactive Protein) (86140-FCRP) TLB-Sedimentation Rate (ESR) (85652-ESR) T-Lyme Disease (60454-09811) Prescription Created Electronically [phone])  Problem # 2:  TICK BITE (ICD-E906.4)  Orders: Venipuncture (29562) TLB-BMP (Basic Metabolic Panel-BMET) (80048-METABOL) TLB-CBC Platelet - w/Differential (85025-CBCD) TLB-Hepatic/Liver Function Pnl (80076-HEPATIC) TLB-CRP-High Sensitivity (C-Reactive Protein) (86140-FCRP) TLB-Sedimentation Rate (ESR) (85652-ESR) T-Lyme Disease (13086-57846) Prescription Created Electronically [phone])  Problem # 3:  HYPOTHYROIDISM (ICD-244.9)  His updated medication list for this problem includes:    Synthroid 100 Mcg Tabs (Levothyroxine sodium) ..... Once daily  Orders: TLB-TSH (Thyroid Stimulating Hormone) (84443-TSH)  Problem # 4:  NECK MASS (ICD-784.2) the lesion feels like it could be pathological and though he does not currently abuse EtOH or tobacco he has in the past so I think the mass should be screened with CT for malignant process. Orders: Radiology Referral (Radiology)  Complete Medication List: 1)  Vytorin 10-40 Mg Tabs (Ezetimibe-simvastatin) .... At bedtime 2)  Synthroid 100 Mcg Tabs (Levothyroxine sodium) .... Once daily 3)  Oxycodone Hcl 5 Mg Tabs (Oxycodone hcl) .Marland Kitchen.. 1 - 2 by mouth q 6 hrs as needed 4)  Budeprion Sr 150 Mg Xr12h-tab (Bupropion hcl) .... 2 qdtab 5)  Trazodone Hcl 50 Mg Tabs (Trazodone hcl) .Marland Kitchen.. 1 or 2 by mouth at bedtime 6)  Citalopram Hydrobromide 20 Mg Tabs (Citalopram hydrobromide) .Marland Kitchen.. 1 by mouth once daily 7)  Doxycycline Hyclate 100 Mg Tabs (Doxycycline hyclate) .... One by mouth two times a day for 14 days  Other Orders: Tdap => 33yrs IM (28413) Admin 1st Vaccine (24401)  Patient Instructions: 1)  Please schedule a follow-up appointment in 2 weeks. 2)  Take your antibiotic as prescribed until ALL of it is gone, but stop if you develop a rash or swelling and  contact our office as soon as possible. Prescriptions: DOXYCYCLINE HYCLATE 100 MG TABS (DOXYCYCLINE HYCLATE) One by mouth two times a day for 14 days  #28 x 1   Entered and Authorized by:   Etta Grandchild MD   Signed by:   Etta Grandchild MD on 05/29/2010   Method used:   Electronically to        [facility]  [address] [phone]* (retail)       [address]       [place], [place]  [address]       Ph: [phone] or [phone]       Fax: [phone]   RxID:   [id number]    Immunizations Administered:  Tetanus Vaccine:    Vaccine Type: Tdap    Site: right deltoid    Mfr: GlaxoSmithKline    Dose: 0.5 ml    Route: IM    Given by: Rock Nephew CMA    Exp. Date:  03/17/2012    Lot #: [id number]    VIS given: 11/11/07 version given May 29, 2010.

## 2011-01-23 NOTE — Letter (Signed)
Summary: [facility]  [facility]   Imported By: Lennie Odor 11/17/2010 14:37:02  _____________________________________________________________________  External Attachment:    Type:   Image     Comment:   External Document

## 2011-01-23 NOTE — Letter (Signed)
Summary: [facility] note/ Flo Shanks MD  [facility] note/ Flo Shanks MD   Imported By: Lester [place] 11/06/2010 09:47:12  _____________________________________________________________________  External Attachment:    Type:   Image     Comment:   External Document

## 2011-01-23 NOTE — Consult Note (Signed)
Summary: [facility]  [facility]   Imported By: Lester [place] 11/21/2010 12:42:25  _____________________________________________________________________  External Attachment:    Type:   Image     Comment:   External Document

## 2011-01-23 NOTE — Letter (Signed)
Summary: Phone Message/[facility]  Phone Message/[facility]   Imported By: Sherian Rein 09/12/2010 10:51:51  _____________________________________________________________________  External Attachment:    Type:   Image     Comment:   External Document

## 2011-01-23 NOTE — Consult Note (Signed)
Summary: [facility]  [facility]   Imported By: Sherian Rein 08/07/2010 12:00:49  _____________________________________________________________________  External Attachment:    Type:   Image     Comment:   External Document

## 2011-01-23 NOTE — Progress Notes (Signed)
Summary: LETTER  Phone Note Call from Patient Call back at [facility] Phone [phone]   Summary of Call: At last office visit pt requested a second oppinion letter regarding his PTSD for the [place]. They would like to know the status of this letter.  Initial call taken by: Lamar Sprinkles, CMA,  January 09, 2010 2:21 PM  Follow-up for Phone Call        Done - waiting on return from transcription, should go out soon  Follow-up by: Jacques Navy MD,  January 09, 2010 5:19 PM  Additional Follow-up for Phone Call Additional follow up Details #1::        call pt and informed him that letterwas up front to be picked up at his convience. Additional Follow-up by: Ami Bullins CMA,  January 10, 2010 9:12 AM

## 2011-01-23 NOTE — Letter (Signed)
Summary: [facility]  [facility]   Imported By: Lester [place] 08/04/2010 10:50:09  _____________________________________________________________________  External Attachment:    Type:   Image     Comment:   External Document

## 2011-01-23 NOTE — Progress Notes (Signed)
Summary: REPORTS  Phone Note Call from Patient   Summary of Call: Patient's wife called regarding request for copy of labs and ct report. Pt had ENT apt w/bx yesterday. They told pt to expect results possibly as soon as today. I requested that they call office or have ENT forward info to Dr Debby Bud so he will be aware. Wife says pt was surprised that Dr Debby Bud had not called him already regarding treatment. I advised her that MD was aware is interested in an update as soon as the biopsy results are back. Copy of report was left at front desk for patient to pick up.  Initial call taken by: Lamar Sprinkles, CMA,  June 07, 2010 9:27 AM  Follow-up for Phone Call        k

## 2011-01-23 NOTE — Letter (Signed)
   [facility] [address] [place], [place]  [address] Phone: [phone]      October 12, 2010   Michael Singleton [address] [place], [place] [address]  RE:  LAB RESULTS  Dear  [name],  The following is an interpretation of your most recent lab tests.  Please take note of any instructions provided or changes to medications that have resulted from your lab work.   2 D echo reveals a slightly diminished ejection fraction at 55%, normal being 60-65%. There are some wall motion changes that are most likely related to prior heart attack. NO acute change.    This is an OK study.  come see me if you have any questions about these lab results.   Sincerely Yours,    Jacques Navy MD

## 2011-01-23 NOTE — Letter (Signed)
   [facility] [address] [place], [place]  [address] Phone: [phone]      January 16, 2010   Michael Singleton [address] [place], [place] [address]  RE:  LAB RESULTS  Dear  Michael Singleton,  The following is an interpretation of your most recent lab tests.  Please take note of any instructions provided or changes to medications that have resulted from your lab work.    X-rays reveal multi-level mild to moderate degenrative disk disease.  Hip films are normal   Call or e-mail me if you have questions ([email or web address]).   Sincerely Yours,    Jacques Navy MD DG LUMBAR SPINE COMPLETE - [phone]   Clinical Data: Chronic low back pain and right hip pain   LUMBAR SPINE - COMPLETE 4+ VIEW   Comparison: Report of a prior study 07/14/2003   Findings: There are five non-rib bearing lumbar type vertebrae. There is disc narrowing at L1-02, L2-L3, and L4-L5.  Some of these changes were described on the prior study.   There is mild to moderate facet arthropathy.  No acute findings.   Soft tissues show heavy aortoiliac calcification without aneurysm.   IMPRESSION: 1.  Degenerative disc disease as above. 2.  Aortoiliac calcification. 3.  No acute findings.   Read By:  Burnett Sheng  DG HIP BILATERAL W/PELVIS - [id number]   Clinical Data: Low back and right hip pain   BILATERAL HIP WITH PELVIS - 4+ VIEW   Comparison: 07/05/2004   Findings: Normal alignment.  Intact bony pelvis and symmetric hips. No displaced fracture.  No significant arthritic change.  Symmetric trabecular pattern.  Normal SI joints.   IMPRESSION: No acute bony abnormality   Read By:  Sigurd Sos.,  M.D.

## 2011-01-23 NOTE — Letter (Signed)
Summary: Phone Message/[facility]  Phone Message/[facility]   Imported By: Sherian Rein 06/14/2010 15:02:19  _____________________________________________________________________  External Attachment:    Type:   Image     Comment:   External Document

## 2011-01-23 NOTE — Consult Note (Signed)
Summary: [facility]  [facility]   Imported By: Florinda Marker 08/16/2010 11:30:27  _____________________________________________________________________  External Attachment:    Type:   Image     Comment:   External Document

## 2011-01-23 NOTE — Progress Notes (Signed)
  Phone Note Refill Request   Refills Requested: Medication #1:  TRAZODONE HCL 50 MG  TABS 1 or 2 by mouth at bedtime  Medication #2:  VYTORIN 10-40 MG  TABS at bedtime  Medication #3:  SYNTHROID 100 MCG  TABS once daily  Medication #4:  CITALOPRAM HYDROBROMIDE 20 MG TABS 1 by mouth once daily. Pt also req refills for hydrocodone and welbutrin (not on med list). Req all to go to [facility]   Initial call taken by: Lamar Sprinkles, CMA,  December 29, 2009 2:54 PM  Follow-up for Phone Call        OK for refills. Wellbutrin = budeprion. Follow-up by: Jacques Navy MD,  December 29, 2009 5:35 PM  Additional Follow-up for Phone Call Additional follow up Details #1::        Hydrocodone is not on list, but oxycodone is Additional Follow-up by: Lamar Sprinkles, CMA,  December 29, 2009 6:07 PM    Prescriptions: CITALOPRAM HYDROBROMIDE 20 MG TABS (CITALOPRAM HYDROBROMIDE) 1 by mouth once daily  #90 x 3   Entered by:   Ami Bullins CMA   Authorized by:   Jacques Navy MD   Signed by:   Bill Salinas CMA on 01/02/2010   Method used:   Faxed to ...       [facility]             , [place]         Ph:        Fax: [phone]   RxID:   [id number] BUDEPRION SR 150 MG XR12H-TAB (BUPROPION HCL) 2 qdtab  #180 x 3   Entered by:   Ami Bullins CMA   Authorized by:   Jacques Navy MD   Signed by:   Bill Salinas CMA on 01/02/2010   Method used:   Faxed to ...       [facility] (mail-order)             , [place]         Ph:        Fax: [phone]   RxID:   [id number] SYNTHROID 100 MCG  TABS (LEVOTHYROXINE SODIUM) once daily  #90 x 3   Entered by:   Ami Bullins CMA   Authorized by:   Jacques Navy MD   Signed by:   Bill Salinas CMA on 01/02/2010   Method used:   Faxed to ...       [facility]             , [place]         Ph:        Fax: [phone]   RxID:   [id number] VYTORIN 10-40 MG  TABS (EZETIMIBE-SIMVASTATIN) at bedtime  #90 x 3   Entered by:   Ami Bullins  CMA   Authorized by:   Jacques Navy MD   Signed by:   Bill Salinas CMA on 01/02/2010   Method used:   Faxed to ...       [facility] (mail-order)             , [place]         Ph:        Fax: [phone]   RxID:   [id number]

## 2011-01-23 NOTE — Assessment & Plan Note (Signed)
Summary: skin tag-lb   Vital Signs:  Patient profile:   55 year old male Height:      72 inches Weight:      268 pounds BMI:     36.48 O2 Sat:      96 % on Room air Temp:     98.2 degrees F oral Pulse rate:   67 / minute BP sitting:   138 / 76  (left arm) Cuff size:   regular  Vitals Entered By: Bill Salinas CMA (September 15, 2010 1:41 PM)  O2 Flow:  Room air CC: pt here for skin tag removal/ ab Comments Pt is not taking Budeprion or oxycodone   Primary Care Provider:  Mitsuo Budnick  CC:  pt here for skin tag removal/ ab.  History of Present Illness: Patient presents for excision of several skin tags left neck that cause constant irritation. He is also presenting for excision of a sebaceous cyst on his back.   Current Medications (verified): 1)  Vytorin 10-40 Mg  Tabs (Ezetimibe-Simvastatin) .... At Bedtime 2)  Synthroid 100 Mcg  Tabs (Levothyroxine Sodium) .... Once Daily 3)  Oxycodone Hcl 5 Mg Tabs (Oxycodone Hcl) .Marland Kitchen.. 1 - 2 By Mouth Q 6 Hrs As Needed 4)  Budeprion Sr 150 Mg Xr12h-Tab (Bupropion Hcl) .... 2 Qdtab 5)  Trazodone Hcl 50 Mg  Tabs (Trazodone Hcl) .Marland Kitchen.. 1 or 2 By Mouth At Bedtime 6)  Citalopram Hydrobromide 20 Mg Tabs (Citalopram Hydrobromide) .Marland Kitchen.. 1 By Mouth Once Daily  Allergies (verified): No Known Drug Allergies   Complete Medication List: 1)  Vytorin 10-40 Mg Tabs (Ezetimibe-simvastatin) .... At bedtime 2)  Synthroid 100 Mcg Tabs (Levothyroxine sodium) .... Once daily 3)  Oxycodone Hcl 5 Mg Tabs (Oxycodone hcl) .Marland Kitchen.. 1 - 2 by mouth q 6 hrs as needed 4)  Budeprion Sr 150 Mg Xr12h-tab (Bupropion hcl) .... 2 qdtab 5)  Trazodone Hcl 50 Mg Tabs (Trazodone hcl) .Marland Kitchen.. 1 or 2 by mouth at bedtime 6)  Citalopram Hydrobromide 20 Mg Tabs (Citalopram hydrobromide) .Marland Kitchen.. 1 by mouth once daily  Other Orders: I&D Abscess, Simple / Single (10060) Removal of Skin Tags up to 15 Lesions (11200)   Procedure Note Last Tetanus: Tdap (05/29/2010)  Incision & Drainage: The  patient complains of irritation and discharge. Indication: infected lesion  Procedure # 1: I & D    Size (in cm): 3.0 x 3.0    Location: medial of right scapula    Comment: infected sebaceous cyst on back with discomfort. Informed consent obtained. In a sterile fashion using #15 blade, curved hemostats removed entire cyst capsule. No complications    Instrument used: #15 blade    Anesthesia: 2% xylocaine with epi    Closure: simple interrupted       # of superficial sutures: 6  Additional Instructions:  with soap and water. Cover with bandaide to avoid iritiation due to sutures. Return 1 week for suture removal.  Routine wound precautions given.  Skin Tag Removal: The patient complains of pain and irritation. Indication: inflamed lesion  Procedure # 1: snipped    Location: neck-left    # lesions removed: 4    Instrument used: scissors    Anesthesia: ethyl-chloride spray    Comment: informed consent obtained. Used silver nitrate stick for cautery.  Cleaned and prepped with: betadine Additional Instructions: routine wound precautions. may wash tonight.

## 2011-01-23 NOTE — Assessment & Plan Note (Signed)
Summary: f/u appt/#/cd   Vital Signs:  Patient profile:   55 year old male Height:      72 inches Weight:      266 pounds BMI:     36.21 O2 Sat:      99 % on Room air Temp:     98.4 degrees F oral Pulse rate:   55 / minute BP sitting:   142 / 82  (left arm) Cuff size:   regular  Vitals Entered By: Alysia Penna (September 04, 2010 1:14 PM)  O2 Flow:  Room air CC: pt is here for f/u visit. /cp sma   Primary Care Provider:  Norins  CC:  pt is here for f/u visit. /cp sma.  History of Present Illness: Reviewed hospital records and Dr. Raye Sorrow notes. He has finished his antibiotics and so far has been doing well. Reviewed all labs: EBV, Cat scratch fever, cocciodmycosis, sporthrix, fungi, TB, hepatitis B,A,C negative, CMV negative for acute infection, RPR neg, HIV negative.  He has irritated skin tags on his neck which he would like excised. He also has a recurrent sebaceous cyst on the right upper back.  SO wants his Vit D checked - he has no risk factors.  SO wants full thyroid work-up. Lab in July with TSH 2.07 - normal range on current dose of thyroid replacement.   Patient reports that when his inguinal hernia left is bulging out he will get a feeling of cold water running down his leg along with pain.  Patient needs a letter certifying his PTSD  Current Medications (verified): 1)  Vytorin 10-40 Mg  Tabs (Ezetimibe-Simvastatin) .... At Bedtime 2)  Synthroid 100 Mcg  Tabs (Levothyroxine Sodium) .... Once Daily 3)  Oxycodone Hcl 5 Mg Tabs (Oxycodone Hcl) .Marland Kitchen.. 1 - 2 By Mouth Q 6 Hrs As Needed 4)  Budeprion Sr 150 Mg Xr12h-Tab (Bupropion Hcl) .... 2 Qdtab 5)  Trazodone Hcl 50 Mg  Tabs (Trazodone Hcl) .Marland Kitchen.. 1 or 2 By Mouth At Bedtime 6)  Citalopram Hydrobromide 20 Mg Tabs (Citalopram Hydrobromide) .Marland Kitchen.. 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) ACUTE LYMPHADENITIS (ICD-683) ALLERGIC RHINITIS CAUSE UNSPECIFIED (ICD-477.9) HEADACHE (ICD-784.0) FATIGUE (ICD-780.79) DYSPNEA (ICD-786.05) LOW BACK PAIN (ICD-724.2) ELBOW PAIN, RIGHT (ICD-719.42) KNEE PAIN, BILATERAL (ICD-719.46) POLYARTHRITIS (ICD-719.49) MALE INFERTILITY (ICD-606.9) OTHER DISEASES OF NASAL CAVITY AND SINUSES (ICD-478.19) POSTTRAUMATIC STRESS DISORDER (ICD-309.81) DIVERTICULOSIS, COLON (ICD-562.10) FAMILY HISTORY OF ALCOHOLISM/ADDICTION (ICD-V61.41) HYPOTHYROIDISM (ICD-244.9) MYOCARDIAL INFARCTION, HX OF (ICD-412) OBSTRUCTIVE SLEEP APNEA (ICD-327.23) GRIEF REACTION, ACUTE (ICD-309.0) HYPERTENSION (ICD-401.9) HYPERLIPIDEMIA (ICD-272.4) CORONARY ARTERY DISEASE (ICD-414.00)    Physician Roster:               ENT- Dr. Lazarus Salines               Pul - Dr. Craige Cotta  Past Surgical History: Rotator cuff repair PTCB stent Inguinal herniorrhaphy I&D right neck for lympadenitis Aug '11   Family History: Reviewed history from 02/02/2008 and no changes required. mother with heart disease and breast cancer Family History of Alcoholism/Addiction  Social History: [employer] - under 2 years - PTSD lives with SO. Just lost his son. Close to his 2 y/o grand-daughter Marriage is strained by loss Alcohol use-no Drug use-no Regular exercise-no  Review of Systems       The patient complains of abdominal pain.  The patient denies anorexia, fever, weight  loss, decreased hearing, chest pain, syncope, dyspnea on exertion, prolonged cough, severe indigestion/heartburn, muscle weakness, suspicious skin lesions, and enlarged lymph nodes.    Physical Exam  General:  Heavyset white male in no distress Head:  normocephalic and atraumatic.   Eyes:  pupils equal and pupils round.  C&S cleaar Neck:  well healed surgical scar right neck. Small palpable firm lymph node right anterior cervical chain at the angle of the jaw. Not tender.    Lungs:  normal respiratory effort.   Heart:  normal rate and regular rhythm.   Abdomen:  soft.   Neurologic:  alert & oriented X3, cranial nerves II-XII intact, and gait normal.   Skin:  several small skin tags on neck, more left than right. 2.5 cm cystic structure right upper back in the wing of his tatoo. Soft, well encapsulated.  Psych:  Oriented X3, normally interactive, and good eye contact.     Impression & Recommendations:  Problem # 1:  ACUTE LYMPHADENITIS (ICD-683) Patient has completed a long course of antibiotics. All common and exotic cultures and serology is negative (see HPI). He has not had recurrent problems since stopping antibiotics. He is scheduled to see Dr. Lazarus Salines early October for follow-up.  Problem # 2:  POSTTRAUMATIC STRESS DISORDER (ICD-309.81) Letter prepared - see documents  Problem # 3:  HYPOTHYROIDISM (ICD-244.9) Labs reviewed: well controlled on present dose of synthroid. He has no risk factors for Vit D deficiency - screening is not indicated.   His updated medication list for this problem includes:    Synthroid 100 Mcg Tabs (Levothyroxine sodium) ..... Once daily  Problem # 4:  HYPERTENSION (ICD-401.9)  BP today: 142/82 Prior BP: 138/74 (07/18/2010)  Labs Reviewed: K+: 4.6 (05/29/2010) Creat: : 0.9 (05/29/2010)   Chol: 174 (01/09/2010)   HDL: 35.50 (01/09/2010)   LDL: DEL (09/19/2007)   TG: 361.0 (01/09/2010)  Adequate control on present medications.   Problem # 5:  INGUINAL HERNIA, LEFT (ICD-550.90) exam reveals a soft hernia  Plan - elective repair when his ENT eval/work-up is complete           he is educated as to signs of acute incarceration.   Problem # 6:  SEBACEOUS CYST (ICD-706.2) Patient will return for excision of sebaceous cyst and removal of skin tags.  Complete Medication List: 1)  Vytorin 10-40 Mg Tabs (Ezetimibe-simvastatin) .... At bedtime 2)  Synthroid 100 Mcg Tabs (Levothyroxine sodium) .... Once daily 3)  Oxycodone  Hcl 5 Mg Tabs (Oxycodone hcl) .Marland Kitchen.. 1 - 2 by mouth q 6 hrs as needed 4)  Budeprion Sr 150 Mg Xr12h-tab (Bupropion hcl) .... 2 qdtab 5)  Trazodone Hcl 50 Mg Tabs (Trazodone hcl) .Marland Kitchen.. 1 or 2 by mouth at bedtime 6)  Citalopram Hydrobromide 20 Mg Tabs (Citalopram hydrobromide) .Marland Kitchen.. 1 by mouth once daily

## 2011-01-23 NOTE — Progress Notes (Signed)
  Phone Note Other Incoming   Summary of Call: orders put in emr for xray Initial call taken by: Ami Bullins CMA,  January 09, 2010 2:05 PM

## 2011-01-23 NOTE — Consult Note (Signed)
Summary: [facility]  [facility]   Imported By: Lester Caribou 06/09/2010 10:20:50  _____________________________________________________________________  External Attachment:    Type:   Image     Comment:   External Document

## 2011-01-23 NOTE — Progress Notes (Signed)
----   Converted from flag ---- ---- 06/01/2010 8:44 AM, Edman Circle wrote: appt 6/21 @ 2:00  ---- 06/01/2010 8:41 AM, Dagoberto Reef wrote: pt need doppler, Thanks  ---- 05/31/2010 3:16 PM, Etta Grandchild MD wrote: The following orders have been entered for this patient and placed on Admin Hold:  Type:     Referral       Code:   ENT Description:   ENT Referral Order Date:   05/31/2010   Authorized By:   Etta Grandchild MD Order #:   [id number] Clinical Notes: ------------------------------

## 2011-01-23 NOTE — Initial Assessments (Signed)
Summary: RIGHT SIDE NECK SWOLLEN/CD   Vital Signs:  Patient profile:   55 year old male Height:      72 inches Weight:      266 pounds BMI:     36.21 O2 Sat:      97 % on Room air Temp:     98.1 degrees F oral Pulse rate:   62 / minute BP sitting:   138 / 74  (left arm) Cuff size:   large  Vitals Entered By: Bill Salinas CMA (July 18, 2010 9:47 AM)  O2 Flow:  Room air CC: pt here with c/o of swelling on right side of his neck/ ab   Primary Care Provider:  Zurii Hewes  CC:  pt here with c/o of swelling on right side of his neck/ ab.  History of Present Illness: Patient presentts for acute swelling of the right neck ove the past 12 hours. He has had difficulty with swallow and significant pain. He is now admitted for  IV anitbiotic and further diagnostic testing.   Current Medications (verified): 1)  Vytorin 10-40 Mg  Tabs (Ezetimibe-Simvastatin) .... At Bedtime 2)  Synthroid 100 Mcg  Tabs (Levothyroxine Sodium) .... Once Daily 3)  Oxycodone Hcl 5 Mg Tabs (Oxycodone Hcl) .Marland Kitchen.. 1 - 2 By Mouth Q 6 Hrs As Needed 4)  Budeprion Sr 150 Mg Xr12h-Tab (Bupropion Hcl) .... 2 Qdtab 5)  Trazodone Hcl 50 Mg  Tabs (Trazodone Hcl) .Marland Kitchen.. 1 or 2 By Mouth At Bedtime 6)  Citalopram Hydrobromide 20 Mg Tabs (Citalopram Hydrobromide) .Marland Kitchen.. 1 By Mouth Once Daily  Allergies (verified): No Known Drug Allergies  Past History:  Past Medical History: Last updated: 03/08/2009 Coronary artery disease Depression with ? paranoid schizophrenia in the past Hyperlipidemia Hypertension OSA Myocardial infarction, hx of Hypothyroidism ? FMS hx of nasal fx hx of skull fx hx of wrist fx hx of traumatic vetebral fx Diverticulosis, colon Anxiety hx of lumbar disc disease Low back pain - chronic  Past Surgical History: Last updated: 02/02/2008 Rotator cuff repair PTCB stent Inguinal herniorrhaphy  Family History: Last updated: 02/02/2008 mother with heart disease and breast cancer Family History of  Alcoholism/Addiction  Social History: Last updated: 05/29/2010 lives with SO. Just lost his son. Close to his 2 y/o grand-daughter Marriage is strained by loss Alcohol use-no Drug use-no Regular exercise-no  Risk Factors: Smoking Status: quit > 6 months (05/29/2010) Packs/Day: 1.5 (05/29/2010)  Review of Systems       The patient complains of enlarged lymph nodes.  The patient denies anorexia, fever, weight loss, weight gain, decreased hearing, hoarseness, syncope, peripheral edema, prolonged cough, hemoptysis, severe indigestion/heartburn, hematuria, incontinence, suspicious skin lesions, transient blindness, depression, and abnormal bleeding.    Physical Exam  General:  alert, well-developed, well-nourished, well-hydrated, and normal appearance.   Head:  normocephalic, atraumatic, and no abnormalities observed.   Eyes:  vision grossly intact, pupils equal, pupils round, corneas and lenses clear, and no injection.   Ears:  R EAC tight, TM normal. L EAC with cerumen Nose:  no external deformity and no external erythema.   Mouth:  dentures in place. No buccal lesions. No exudate from prorotid duct. Posterior pharynx clear.  Neck:  massive swelling of right face and neck extending from the ear to the neck down to the clavicle, obscuring the jaw line, anteriorly to the midline. The area is hot to the touch and very tender Chest Wall:  No deformities, masses, tenderness or gynecomastia noted. Lungs:  Normal respiratory effort,  chest expands symmetrically. Lungs are clear to auscultation, no crackles or wheezes. Heart:  Normal rate and regular rhythm. S1 and S2 normal without gallop, murmur, click, rub or other extra sounds. Abdomen:  Bowel sounds positive,abdomen soft and non-tender without masses, organomegaly or hernias noted. Rectal:  deferred Prostate:  deferred Msk:  missing distal phalanx of the left thumb. Otherwise normal Pulses:  2+ radial  Neurologic:  alert & oriented X3,  cranial nerves II-XII intact, strength normal in all extremities, gait normal, and DTRs symmetrical and normal.   Skin:  see neck exam. turgor normal, color normal, no rashes, and no suspicious lesions.   Cervical Nodes:  right side obscurred by swelling Psych:  Oriented X3, memory intact for recent and remote, and normally interactive.     Impression & Recommendations:  Problem # 1:  NECK MASS (ICD-784.2)  Patient with acute swelling of the area of the parotid and submandiublar glands with heat and tenderness. His swallowing is compromised. No difficulty with the airway.   Plan - admit to a non-tele bed          Lab - CBCD, Bmet          CT neck          IVF          Zosy IV stat  Orders: No Charge Patient Arrived (NCPA0) (NCPA0)  Problem # 2:  ACUTE LYMPHADENITIS (ICD-683) Patient had a thorough work-up with an inconclusive needle biopsy. He is to have excisional  biopsy in the near future to r/o lymphoma.  The following medications were removed from the medication list:    Doxycycline Hyclate 100 Mg Tabs (Doxycycline hyclate) ..... One by mouth two times a day for 14 days  Problem # 3:  HYPERTENSION (ICD-401.9) will continue home meds  Problem # 4:  HYPERLIPIDEMIA (ICD-272.4) Will continue home meds  His updated medication list for this problem includes:    Vytorin 10-40 Mg Tabs (Ezetimibe-simvastatin) .Marland Kitchen... At bedtime  Complete Medication List: 1)  Vytorin 10-40 Mg Tabs (Ezetimibe-simvastatin) .... At bedtime 2)  Synthroid 100 Mcg Tabs (Levothyroxine sodium) .... Once daily 3)  Oxycodone Hcl 5 Mg Tabs (Oxycodone hcl) .Marland Kitchen.. 1 - 2 by mouth q 6 hrs as needed 4)  Budeprion Sr 150 Mg Xr12h-tab (Bupropion hcl) .... 2 qdtab 5)  Trazodone Hcl 50 Mg Tabs (Trazodone hcl) .Marland Kitchen.. 1 or 2 by mouth at bedtime 6)  Citalopram Hydrobromide 20 Mg Tabs (Citalopram hydrobromide) .Marland Kitchen.. 1 by mouth once daily

## 2011-01-23 NOTE — Progress Notes (Signed)
Summary: PT IN SURGERY AT [facility] TODAY  Phone Note Call from Patient Call back at Home Phone [phone]   Caller: Wife Summary of Call: FYI - Wife called, pt went into surgery today at 8 am. Wife is req that you stop by to visit.  Initial call taken by: Lamar Sprinkles, CMA,  October 19, 2010 12:20 PM  Follow-up for Phone Call        will check hospital this evening if he is admitted.  Follow-up by: Jacques Navy MD,  October 19, 2010 1:23 PM

## 2011-01-23 NOTE — Progress Notes (Signed)
  Phone Note Outgoing Call   Reason for Call: Discuss lab or test results Summary of Call: called patient and his wife: cytopathology report: susupicious for atypical lymphoproliferative process, i.e. lymphoma. Discussed options of watchful waiting after antibiotics vs moving ahead with excisional biopsy. During discussion pt does admit to having night sweats in addition to generalized malaise. At close of discussion Mrs. Sass states that they will both be more comfortable knowing a definitive answer. They are scheduled to see Dr. Lazarus Salines Monday, June 27th and I suggest they express their wish for diagnostic excisional biopsy. We discussed briefly that if the node is lymphoma he will need a staging work-up followed by oncology consult. They are informed that the response rate to treatment is very positive.

## 2011-01-23 NOTE — Progress Notes (Signed)
Summary: Department of [employer] for records received from Department of [employer]. Request forwarded to [employer]. Dena Chavis  December 26, 2009 12:22 PM

## 2011-01-23 NOTE — Letter (Signed)
Summary: [facility]  [facility]   Imported By: Lennie Odor 06/23/2010 14:27:17  _____________________________________________________________________  External Attachment:    Type:   Image     Comment:   External Document

## 2011-01-23 NOTE — Progress Notes (Signed)
Summary: Refills-Norins pt  Phone Note Refill Request Message from:  Fax from Pharmacy on June 28, 2010 10:43 AM  Refills Requested: Medication #1:  BUDEPRION SR 150 MG XR12H-TAB 2 qdtab   Notes: [facility]  Medication #2:  TRAZODONE HCL 50 MG  TABS 1 or 2 by mouth at bedtime Initial call taken by: Lucious Groves,  June 28, 2010 10:43 AM  Follow-up for Phone Call        ok 2 ref each needs OV w/Dr Norins Follow-up by: Tresa Garter MD,  June 28, 2010 5:39 PM    Prescriptions: TRAZODONE HCL 50 MG  TABS (TRAZODONE HCL) 1 or 2 by mouth at bedtime  #180 x 1   Entered by:   Ami Bullins CMA   Authorized by:   Jacques Navy MD   Signed by:   Bill Salinas CMA on 06/29/2010   Method used:   Faxed to ...       [facility]  [address] [phone]* (retail)       [address]       [place], [place]  [address]       Ph: [phone] or [phone]       Fax: [phone]   RxID:   [id number] BUDEPRION SR 150 MG XR12H-TAB (BUPROPION HCL) 2 qdtab  #180 x 0   Entered by:   Bill Salinas CMA   Authorized by:   Jacques Navy MD   Signed by:   Bill Salinas CMA on 06/29/2010   Method used:   Faxed to ...       [facility]  [address] [phone]* (retail)       [address]       [place], [place]  [address]       Ph: [phone] or [phone]       Fax: [phone]   RxID:   [id number]

## 2011-01-23 NOTE — Consult Note (Signed)
Summary: [facility]  [facility]   Imported By: Sherian Rein 10/12/2010 11:26:20  _____________________________________________________________________  External Attachment:    Type:   Image     Comment:   External Document

## 2011-01-23 NOTE — Letter (Signed)
Summary: [facility]  [facility]   Imported By: Lester Midway 08/11/2010 10:13:33  _____________________________________________________________________  External Attachment:    Type:   Image     Comment:   External Document

## 2011-01-23 NOTE — Consult Note (Signed)
Summary: [facility]  [facility]   Imported By: Sherian Rein 09/19/2010 12:47:47  _____________________________________________________________________  External Attachment:    Type:   Image     Comment:   External Document

## 2011-01-23 NOTE — Consult Note (Signed)
Summary: [facility]  [facility]   Imported By: Lennie Odor 09/11/2010 14:45:28  _____________________________________________________________________  External Attachment:    Type:   Image     Comment:   External Document

## 2011-01-23 NOTE — Progress Notes (Signed)
Summary: FORM  Phone Note Call from Patient Call back at Home Phone [phone]   Caller: Michael Singleton/fiance---[phone] Call For: Michael Navy MD Summary of Call: Pt needs 2 handicap stickers. Please advise. Initial call taken by: Verdell Face,  November 03, 2010 9:53 AM  Follow-up for Phone Call        will be happy to complete short-term form. Got any? Usually one placard is good since they can move it from one vehicle to another. Follow-up by: Michael Navy MD,  November 03, 2010 12:55 PM  Additional Follow-up for Phone Call Additional follow up Details #1::        Pending MD's signature...............Marland KitchenLamar Sprinkles, CMA  November 03, 2010 6:10 PM     Additional Follow-up for Phone Call Additional follow up Details #2::    Pt informed to pick up Follow-up by: Lamar Sprinkles, CMA,  November 06, 2010 12:05 PM

## 2011-01-23 NOTE — Progress Notes (Signed)
Summary: [facility]  Phone Note Call from Patient   Summary of Call: See previous phone note. Patient is also req refills on trazodone and hydrocodone. In EMR it has oxycodone. Per pt, oxycodone did not work for him. Last rx pt has is hydrocodone 5/500 1 to 2 q 4 to 6 hours as needed #180. Ok to print and fax into [facility]?  Initial call taken by: Lamar Sprinkles, CMA,  January 02, 2010 10:21 AM  Follow-up for Phone Call        OK for Hydrocodone/APAP 5/500 1-2 q 6, #180 refill x 3. Please correct med list. THANKS Follow-up by: Jacques Navy MD,  January 02, 2010 2:34 PM  Additional Follow-up for Phone Call Additional follow up Details #1::        Pending signature Additional Follow-up by: Lamar Sprinkles, CMA,  January 02, 2010 3:28 PM    Additional Follow-up for Phone Call Additional follow up Details #2::    Faxed hydrocodone and trazodone to [facility]. Follow-up by: Lucious Groves,  January 03, 2010 11:11 AM  New/Updated Medications: TRAZODONE HCL 50 MG  TABS (TRAZODONE HCL) 1 or 2 by mouth at bedtime HYDROCODONE-ACETAMINOPHEN 5-500 MG TABS (HYDROCODONE-ACETAMINOPHEN) 1 to 2 q 6 hrs as needed pain Prescriptions: TRAZODONE HCL 50 MG  TABS (TRAZODONE HCL) 1 or 2 by mouth at bedtime  #180 x 1   Entered by:   Lamar Sprinkles, CMA   Authorized by:   Jacques Navy MD   Signed by:   Lamar Sprinkles, CMA on 01/02/2010   Method used:   Printed then faxed to ...       [facility] [address] [phone]* (retail)       [address]       [place], [place]  [address]       Ph: [phone] or [phone]       Fax: [phone]   RxID:   [id number] HYDROCODONE-ACETAMINOPHEN 5-500 MG TABS (HYDROCODONE-ACETAMINOPHEN) 1 to 2 q 6 hrs as needed pain  #180 x 3   Entered by:   Lamar Sprinkles, CMA   Authorized by:   Jacques Navy MD   Signed by:   Lamar Sprinkles, CMA on 01/02/2010   Method used:   Printed then faxed to ...       [facility]  [address] [phone]* (retail[facility]       [address]       [place], [place]  [address]       Ph: [phone] or [phone]       Fax: [phone]   RxID:   [id number]

## 2011-01-23 NOTE — Progress Notes (Signed)
Summary: UPDATE  Phone Note Call from Patient Call back at Home Phone [phone]   Caller: Jennette Kettle - Wife  Summary of Call: Pt's wife called w/update. Bx per [facility] ENT - No cancer. A report was faxed today from MD and is avail for your review.  Initial call taken by: Lamar Sprinkles, CMA,  June 13, 2010 4:04 PM

## 2011-01-23 NOTE — Progress Notes (Signed)
  Phone Note Outgoing Call   Reason for Call: Discuss lab or test results Summary of Call: Please call Dr. Raye Sorrow office in regard to path report on needle biopsy of lymph node Thanks (let him know tht I am the PCP) Initial call taken by: Jacques Navy MD,  June 12, 2010 9:02 PM  Follow-up for Phone Call        Spoke w/Dr [facility] office, they will fax info today Follow-up by: Lamar Sprinkles, CMA,  June 13, 2010 1:26 PM

## 2011-01-23 NOTE — Assessment & Plan Note (Signed)
Summary: SORE THROAT--AND DISCUSSION  STC   Vital Signs:  Patient profile:   55 year old male Height:      72 inches Weight:      265 pounds BMI:     36.07 O2 Sat:      97 % on Room air Temp:     97.2 degrees F oral Pulse rate:   60 / minute BP sitting:   158 / 80  (left arm) Cuff size:   regular  Vitals Entered By: Bill Salinas CMA (November 01, 2010 11:34 AM)  O2 Flow:  Room air CC: pt here for follow up after surg/ ab   Primary Care Provider:  Norins  CC:  pt here for follow up after surg/ ab.  History of Present Illness: Patient presents for follow-up.He has a long saga involving the development of an enlarged lymph node right neck with subsequent infection and hospitalization. 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 Surgery was a radical neck dissection with excision of nodes, parotid, submandibular gland and right tonsil. He is now seeing oncology and is going to have chem and 18 courseXRT treatment. He is having some difficulty eating post-op secondary to facial nerve palsy post-op. He does c/o soreness at the right tonsillar fossa. he did see Dr. Lazarus Salines yesterday who did not see any signs of infections or healing problems.  Current Medications (verified): 1)  Vytorin 10-40 Mg  Tabs (Ezetimibe-Simvastatin) .... At Bedtime 2)  Synthroid 100 Mcg  Tabs (Levothyroxine Sodium) .... Once Daily 3)  Oxycodone Hcl 5 Mg Tabs (Oxycodone Hcl) .Marland Kitchen.. 1 - 2 By Mouth Q 6 Hrs As Needed 4)  Budeprion Sr 150 Mg Xr12h-Tab (Bupropion Hcl) .... 2 Qdtab 5)  Trazodone Hcl 50 Mg  Tabs (Trazodone Hcl) .Marland Kitchen.. 1 or 2 By Mouth At Bedtime 6)  Citalopram Hydrobromide 20 Mg Tabs (Citalopram Hydrobromide) .Marland Kitchen.. 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 Lazarus Salines               Pul - Dr. 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  The patient denies anorexia, weight loss, weight gain, chest pain, dyspnea on exertion, prolonged cough, and abdominal pain.    Physical Exam  General:  WNWD white male Head:  normocephalic.   Eyes:  pupils equal and pupils round.  C&S clear Mouth:  right facial droop and paralysis Neck:  healing surgical incision from behind the ear to the chin. No swellng. Lungs:  normal respiratory effort and normal breath sounds.   Heart:  normal rate and regular rhythm.   Abdomen:  soft and normal bowel sounds.  Msk:  no joint tenderness and no joint deformities.   Neurologic:  alert & oriented X3.  facial nerve palsy right Skin:  healing surgical incision right neck Psych:  Oriented X3, normally interactive, good eye contact, and not anxious appearing.     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 Offered encouragement and support. He can call at any time for problems or information.   Complete Medication List: 1)  Vytorin 10-40 Mg Tabs  (Ezetimibe-simvastatin) .... At bedtime 2)  Synthroid 100 Mcg Tabs (Levothyroxine sodium) .... Once daily 3)  Oxycodone Hcl 5 Mg Tabs (Oxycodone hcl) .Marland Kitchen.. 1 - 2 by mouth q 6 hrs as needed 4)  Budeprion Sr 150 Mg Xr12h-tab (Bupropion hcl) .... 2 qdtab 5)  Trazodone Hcl 50 Mg Tabs (Trazodone hcl) .Marland Kitchen.. 1 or 2 by mouth at bedtime 6)  Citalopram Hydrobromide 20 Mg Tabs (Citalopram hydrobromide) .Marland Kitchen.. 1 by mouth once daily Prescriptions: CITALOPRAM HYDROBROMIDE 20 MG TABS (CITALOPRAM HYDROBROMIDE) 1 by mouth once daily  #90 x 3   Entered by:   Ami Bullins CMA   Authorized by:   Jacques Navy MD   Signed by:   Bill Salinas CMA on 11/01/2010   Method used:   Faxed to ...       [facility] (mail-order)             , [place]         Ph:        Fax: [phone]   RxID:   [id number] SYNTHROID 100 MCG  TABS (LEVOTHYROXINE SODIUM) once daily  #90 x 3   Entered by:   Ami Bullins CMA   Authorized by:   Jacques Navy MD   Signed by:   Bill Salinas CMA on 11/01/2010   Method used:   Faxed to ...       [facility] (mail-order)             , [place]         Ph:        Fax: [phone]   RxID:   [id number] VYTORIN 10-40 MG  TABS (EZETIMIBE-SIMVASTATIN) at bedtime  #90 x 3   Entered by:   Ami Bullins CMA   Authorized by:   Jacques Navy MD   Signed by:   Bill Salinas CMA on 11/01/2010   Method used:   Faxed to ...       [facility] (mail-order)             , [place]         Ph:        Fax: [phone]   RxID:   [id number]    Orders Added: 1)  Est. Patient Level III [phone]

## 2011-01-23 NOTE — Progress Notes (Signed)
  Phone Note Outgoing Call Call back at [facility] Phone [phone]   Summary of Call: LA: please let him know that the CT scan showed a suspicious lesion on the right and blockages in the arteries of his neck, i have referred him to an ENT to get a biopsy done and for an u/s of the arteries Initial call taken by: Etta Grandchild MD,  May 31, 2010 3:31 PM  Follow-up for Phone Call        Pt called for results of tests, please call him at [phone]  Follow-up by: Verdell Face,  June 01, 2010 10:24 AM  Additional Follow-up for Phone Call Additional follow up Details #1::        Wife lm on triage requesting a call back  about lab and CT scan results. 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

## 2011-01-23 NOTE — Letter (Signed)
   [facility] [address] [place], [place]  [address] Phone: [phone]      January 16, 2010   RALPH BROUWER [address] [place], [place] [address]  RE:  LAB RESULTS  Dear  Mr. Clutter,  The following is an interpretation of your most recent lab tests.  Please take note of any instructions provided or changes to medications that have resulted from your lab work.  ELECTROLYTES:  Good - no changes needed  KIDNEY FUNCTION TESTS:  Good - no changes needed  LIPID PANEL:  Good - no changes needed Triglyceride: 361.0   Cholesterol: 174   LDL: DEL   HDL: 35.50   Chol/HDL%:  5  THYROID STUDIES:  Thyroid studies normal TSH: 4.00     DIABETIC STUDIES:  Good - no changes needed Blood Glucose: 87    LDL cholesterol (the BAD) 93 - excellent and at goal.    Call or e-mail me if you have questions ([email or web address]).   Sincerely Yours,    Jacques Navy MD

## 2011-01-23 NOTE — Progress Notes (Signed)
Summary: Records Request   Faxed [facility] to [place] [facility] ([phone]). Debby Freiberg  October 18, 2010 3:44 PM

## 2011-01-23 NOTE — Consult Note (Signed)
Summary: [facility]  [facility]   Imported By: Lester Woodside 11/06/2010 10:51:50  _____________________________________________________________________  External Attachment:    Type:   Image     Comment:   External Document

## 2011-01-23 NOTE — Miscellaneous (Signed)
Summary: Orders Update  Clinical Lists Changes  Orders: Added new Test order of Carotid Duplex (Carotid Duplex) - Signed 

## 2011-01-23 NOTE — Letter (Signed)
Summary: Generic Letter  [facility]  [address] [place], [place] [address]   Phone: [phone]  Fax: [phone]         09/04/2010  Re: Michael Singleton     DOB [birth year]     MRN # [id number]  To Whom It May Concern,  [name] has been my primary care patient for several years. 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 His symptoms included depression, low self-esteem, memory impairment, panic attacks, bouts of irrational anger and heightened startle reflex.  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 to be able to be employed and that this is a permanent condition.  Please offer him all due considerations in regard to 100% disability compensation. Thank you for you consideration.   Sincerely,   Illene Regulus MD

## 2011-01-24 ENCOUNTER — Ambulatory Visit: Payer: Medicare Other | Attending: Radiation Oncology | Admitting: Radiation Oncology

## 2011-01-24 DIAGNOSIS — L538 Other specified erythematous conditions: Secondary | ICD-10-CM | POA: Insufficient documentation

## 2011-01-24 DIAGNOSIS — Z510 Encounter for antineoplastic radiation therapy: Secondary | ICD-10-CM | POA: Insufficient documentation

## 2011-01-24 DIAGNOSIS — C8581 Other specified types of non-Hodgkin lymphoma, lymph nodes of head, face, and neck: Secondary | ICD-10-CM | POA: Insufficient documentation

## 2011-01-25 NOTE — Progress Notes (Signed)
  Phone Note Other Incoming   Request: Send information Summary of Call: Request for records received from [employer]. Request forwarded to [employer].     

## 2011-01-25 NOTE — Letter (Signed)
Summary: [facility]  [facility]   Imported By: Lennie Odor 01/08/2011 15:01:30  _____________________________________________________________________  External Attachment:    Type:   Image     Comment:   External Document

## 2011-01-25 NOTE — Letter (Signed)
Summary: [facility]  [facility]   Imported By: Sherian Rein 12/08/2010 11:07:26  _____________________________________________________________________  External Attachment:    Type:   Image     Comment:   External Document

## 2011-01-25 NOTE — Progress Notes (Signed)
  Phone Note Outgoing Call   Reason for Call: Get patient information Summary of Call: called Michael Singleton. he is doing ok. needs Rx - percocet. oxycodone/APAP 5/325 1 by mouth three times a day #90 Will have Ms. Bullins prepare and mail #30 day supply with 3 scripts [facility] [address] [facility] [place]. Initial call taken by: Jacques Navy MD,  December 11, 2010 6:48 PM  Follow-up for Phone Call        prescriptions [facility] [address] [place], [place] [address] Follow-up by: Ami Bullins CMA,  December 12, 2010 4:50 PM    New/Updated Medications: OXYCODONE-ACETAMINOPHEN 5-325 MG TABS (OXYCODONE-ACETAMINOPHEN) Take 1 tablet by mouth three times a day. Fill on or after 12/12/10 OXYCODONE-ACETAMINOPHEN 5-325 MG TABS (OXYCODONE-ACETAMINOPHEN) Take 1 tablet by mouth three times a day. Fill on or after 01/12/11 OXYCODONE-ACETAMINOPHEN 5-325 MG TABS (OXYCODONE-ACETAMINOPHEN) Take 1 tablet by mouth three times a day. Fill on or after 02/12/11 Prescriptions: OXYCODONE-ACETAMINOPHEN 5-325 MG TABS (OXYCODONE-ACETAMINOPHEN) Take 1 tablet by mouth three times a day. Fill on or after 02/12/11  #90 x 0   Entered by:   Rock Nephew CMA   Authorized by:   Jacques Navy MD   Signed by:   Rock Nephew CMA on 12/12/2010   Method used:   Printed then mailed to ...       [facility]  [address] [phone][facility] (retail)       [address]       [place], [place]  [address]       Ph: [phone] or [phone]       Fax: [phone]   RxID:   [id number] OXYCODONE-ACETAMINOPHEN 5-325 MG TABS (OXYCODONE-ACETAMINOPHEN) Take 1 tablet by mouth three times a day. Fill on or after 01/12/11  #90 x 0   Entered by:   Rock Nephew CMA   Authorized by:   Jacques Navy MD   Signed by:   Rock Nephew CMA on 12/12/2010   Method used:   Printed then mailed to ...       [facility]  [address] [phone]* (retail)       [address]       [place], [place]  [address]       Ph: [phone] or  [phone]       Fax: [phone]   RxID:   [id number] OXYCODONE-ACETAMINOPHEN 5-325 MG TABS (OXYCODONE-ACETAMINOPHEN) Take 1 tablet by mouth three times a day. Fill on or after 12/12/10  #90 x 0   Entered by:   Rock Nephew CMA   Authorized by:   Jacques Navy MD   Signed by:   Rock Nephew CMA on 12/12/2010   Method used:   Printed then mailed to ...       [facility]  [address] [phone][facility] ([facility]ail)       [address]       [place], [place]  [address]       Ph: [phone] or [phone]       Fax: [phone]   RxID:   [id number]

## 2011-01-25 NOTE — Consult Note (Signed)
Summary: [facility]  [facility]   Imported By: Sherian Rein 01/09/2011 10:01:50  _____________________________________________________________________  External Attachment:    Type:   Image     Comment:   External Document

## 2011-01-25 NOTE — Assessment & Plan Note (Signed)
Summary: CONGESTION/NWS   Vital Signs:  Patient profile:   55 year old male Height:      72 inches Weight:      266 pounds BMI:     36.21 O2 Sat:      97 % on Room air Temp:     98.0 degrees F oral Pulse rate:   78 / minute BP sitting:   128 / 72  (left arm) Cuff size:   regular  Vitals Entered By: Bill Salinas CMA (January 02, 2011 1:59 PM)  O2 Flow:  Room air CC: pt here with c/o cough (yellow mucous) with head congestion x 5 days/ Pt also wants to discuss smaller pupil in right eye/ ab   Primary Care Provider:  Norins  CC:  pt here with c/o cough (yellow mucous) with head congestion x 5 days/ Pt also wants to discuss smaller pupil in right eye/ ab.  History of Present Illness: Michael Singleton presents for persistent sinus congestion and rhinorrhea of a clear mucus. He reports that he has a hard time breathng at night due to nasal congestion.He has been using afrin with some relief. He has had no fever, chills, purulent drainage, productive cough, SOB.  He has completed 3 courses of CHOP for lymphoma and is soon to start consolidation XRT.  He did have carotid doppler Dec. 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 There was no evidence of dissection involving the right ICA. He has had no CNS sympotms.   Current Medications (verified): 1)  Vytorin 10-40 Mg  Tabs (Ezetimibe-Simvastatin) .... At Bedtime 2)  Synthroid 100 Mcg  Tabs (Levothyroxine Sodium) .... Once Daily 3)  Oxycodone-Acetaminophen 5-325 Mg Tabs (Oxycodone-Acetaminophen) .... Take 1 Tablet By Mouth Three Times A Day. Fill On or After 02/12/11 4)  Budeprion Sr 150 Mg Xr12h-Tab (Bupropion Hcl) .... 2 Qdtab 5)  Trazodone Hcl 50 Mg  Tabs (Trazodone Hcl) .Marland Kitchen.. 1 or 2 By Mouth At Bedtime 6)  Citalopram Hydrobromide 20 Mg Tabs (Citalopram Hydrobromide) .Marland Kitchen.. 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 Lazarus Salines               Pul - Dr. 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  The patient denies anorexia, fever, weight loss, decreased hearing, hoarseness, chest pain, dyspnea on exertion, prolonged cough, abdominal pain, muscle weakness, difficulty walking, depression, and enlarged lymph nodes.    Physical Exam  General:  WNWD white male who looks good: good color, good weight and good attitude. Head:  normocephalic and atraumatic.  No tenderness to percussion over the  frontal and maxillary sinuses. Eyes:  C&S clear.  Ears:  EACs clear, TMs normal Mouth:  Throat is clear.  Neck:  well healed surgical scar right neck with post-operative reduction in tissue mass.  Chest Wall:  no deformities.   Lungs:  normal respiratory effort, normal breath sounds, no crackles, and no wheezes.   Heart:  normal rate and regular rhythm.   Pulses:  2+ radial  Neurologic:  alert & oriented X3,  cranial nerves II-XII intact, strength normal in all extremities, and gait normal.   Skin:  turgor normal, color normal, no rashes, and no suspicious lesions.   Cervical Nodes:  no anterior cervical adenopathy and no posterior cervical adenopathy.   Psych:  Oriented X3, normally interactive, good eye contact, and not anxious appearing.     Impression & Recommendations:  Problem # 1:  ALLERGIC RHINITIS CAUSE UNSPECIFIED (ICD-477.9) Persistent problem. No evidence of infection.   Plan - trial of nasal inhaltinal steroid  - nasonex 2 inhalitions/nares once a day. If this works will call in Rx or Rx for fluticasone.   Complete Medication List: 1)  Vytorin 10-40 Mg Tabs (Ezetimibe-simvastatin) .... At bedtime 2)  Synthroid 100 Mcg Tabs (Levothyroxine sodium) .... Once daily 3)  Oxycodone-acetaminophen 5-325 Mg Tabs (Oxycodone-acetaminophen) .... Take 1 tablet by mouth three times a day. fill on or after 02/12/11 4)  Budeprion Sr 150 Mg Xr12h-tab (Bupropion hcl) .... 2 qdtab 5)  Trazodone Hcl 50 Mg Tabs (Trazodone hcl) .Marland Kitchen.. 1 or 2 by mouth at bedtime 6)  Citalopram Hydrobromide 20 Mg Tabs (Citalopram hydrobromide) .Marland Kitchen.. 1 by mouth once daily   Orders Added: 1)  Est. Patient Level III [[address]]

## 2011-02-06 ENCOUNTER — Encounter (HOSPITAL_BASED_OUTPATIENT_CLINIC_OR_DEPARTMENT_OTHER): Payer: Medicare Other | Admitting: Oncology

## 2011-02-06 DIAGNOSIS — Z452 Encounter for adjustment and management of vascular access device: Secondary | ICD-10-CM

## 2011-02-06 DIAGNOSIS — C8581 Other specified types of non-Hodgkin lymphoma, lymph nodes of head, face, and neck: Secondary | ICD-10-CM

## 2011-02-13 ENCOUNTER — Other Ambulatory Visit (HOSPITAL_COMMUNITY): Payer: Self-pay

## 2011-02-20 ENCOUNTER — Other Ambulatory Visit: Payer: Medicare Other

## 2011-02-20 ENCOUNTER — Ambulatory Visit: Payer: Medicare Other | Admitting: Internal Medicine

## 2011-02-20 ENCOUNTER — Ambulatory Visit (INDEPENDENT_AMBULATORY_CARE_PROVIDER_SITE_OTHER): Payer: Medicare Other | Admitting: Internal Medicine

## 2011-02-20 ENCOUNTER — Other Ambulatory Visit: Payer: Self-pay | Admitting: Internal Medicine

## 2011-02-20 ENCOUNTER — Encounter: Payer: Self-pay | Admitting: Internal Medicine

## 2011-02-20 DIAGNOSIS — E039 Hypothyroidism, unspecified: Secondary | ICD-10-CM

## 2011-02-20 DIAGNOSIS — I10 Essential (primary) hypertension: Secondary | ICD-10-CM

## 2011-02-20 DIAGNOSIS — C8581 Other specified types of non-Hodgkin lymphoma, lymph nodes of head, face, and neck: Secondary | ICD-10-CM

## 2011-02-20 DIAGNOSIS — E785 Hyperlipidemia, unspecified: Secondary | ICD-10-CM

## 2011-02-20 DIAGNOSIS — N469 Male infertility, unspecified: Secondary | ICD-10-CM

## 2011-02-20 LAB — TSH: TSH: 1.07 u[IU]/mL (ref 0.35–5.50)

## 2011-02-20 LAB — TESTOSTERONE: Testosterone: 247.94 ng/dL — ABNORMAL LOW (ref 350.00–890.00)

## 2011-02-20 LAB — BASIC METABOLIC PANEL
BUN: 8 mg/dL (ref 6–23)
Chloride: 102 mEq/L (ref 96–112)
Potassium: 4.8 mEq/L (ref 3.5–5.1)
Sodium: 139 mEq/L (ref 135–145)

## 2011-02-20 LAB — LDL CHOLESTEROL, DIRECT: Direct LDL: 83.8 mg/dL

## 2011-02-20 LAB — LIPID PANEL
Total CHOL/HDL Ratio: 4
VLDL: 47.8 mg/dL — ABNORMAL HIGH (ref 0.0–40.0)

## 2011-02-22 ENCOUNTER — Telehealth: Payer: Self-pay | Admitting: Internal Medicine

## 2011-02-22 ENCOUNTER — Encounter: Payer: Self-pay | Admitting: Internal Medicine

## 2011-03-01 NOTE — Progress Notes (Signed)
Summary: Letter inquiry  Phone Note Outgoing Call Call back at [facility] Phone [phone] Call back at Letter Request   Summary of Call: [facility] per HIPAA.Informed Pt that letter requested is ready.Requested callback as to whether Pt preference is to PU letter or have it mailed. Initial call taken by: Burnard Leigh [facility]),  February 22, 2011 10:04 AM  Follow-up for Phone Call        Mailed Follow-up by: Lamar Sprinkles, CMA,  February 22, 2011 4:03 PM

## 2011-03-01 NOTE — Assessment & Plan Note (Signed)
Summary: CONGESTION   --STC   Vital Signs:  Patient profile:   55 year old male Height:      72 inches Weight:      257 pounds BMI:     34.98 O2 Sat:      99 % on Room air Temp:     97.4 degrees F oral Pulse rate:   54 / minute BP sitting:   128 / 82  (left arm) Cuff size:   regular  Vitals Entered By: Bill Salinas CMA (February 20, 2011 11:37 AM)  O2 Flow:  Room air CC: pt here for evaluation of sinus and chest congestion x 2 weeks. Pt just finished a round of augmentin/ ab   Primary Care Provider:  Clarie Camey  CC:  pt here for evaluation of sinus and chest congestion x 2 weeks. Pt just finished a round of augmentin/ ab.  History of Present Illness: Michael Singleton is well known to me. He had completed his treatment for lymphoma and seems to be doing well. His hair is coming back.  Today he presents for sinus congestion. He has no fever or purulent drainage. He used afrin with some relief. He continues to have fullness and discomfort. No sore throat, no otalgia no swallowing difficulty.  He has brought with him informaatino pertaining to a letter he needs to submit to the [place] for coverage of cialis. 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 For additional proof of hypogonadism will need to check a testosterone level.  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) INGUINAL HERNIA, LEFT (ICD-550.90) DISC DISEASE, LUMBAR (ICD-722.52) OCCLUSION&STENOS CAROTID ART W/O MENTION INFARCT (ICD-433.10) ALLERGIC RHINITIS CAUSE UNSPECIFIED (ICD-477.9) LOW BACK PAIN (ICD-724.2) ELBOW PAIN, RIGHT (ICD-719.42) KNEE PAIN, BILATERAL (ICD-719.46) POLYARTHRITIS (ICD-719.49) MALE INFERTILITY (ICD-606.9) POSTTRAUMATIC STRESS DISORDER (ICD-309.81) DIVERTICULOSIS, COLON (ICD-562.10) FAMILY HISTORY OF ALCOHOLISM/ADDICTION (ICD-V61.41) HYPOTHYROIDISM (ICD-244.9) MYOCARDIAL  INFARCTION, HX OF (ICD-412) OBSTRUCTIVE SLEEP APNEA (ICD-327.23) HYPERTENSION (ICD-401.9) HYPERLIPIDEMIA (ICD-272.4) CORONARY ARTERY DISEASE (ICD-414.00)      Physician Roster:               ENT- Dr. Lazarus Salines               Pul - Dr. Craige Cotta  Past Surgical History: Last updated: 11/01/2010 Rotator cuff repair PTCB stent Inguinal herniorrhaphy I&D right neck for lympadenitis Aug '11  Radical neck dissection right - Oct '11 (Dr.Wolicki)  Family History: Last updated: 02/02/2008 mother with heart disease and breast cancer Family History of Alcoholism/Addiction  Social History: Last updated: 09/04/2010 [employer] Service - under 2 years - PTSD lives with SO. Just lost his son. Close to his 2 y/o grand-daughter Marriage is strained by loss Alcohol use-no Drug use-no Regular exercise-no  Review of Systems  The patient denies anorexia, fever, weight loss, vision loss, decreased hearing, chest pain, syncope, dyspnea on exertion, peripheral edema, headaches, abdominal pain, severe indigestion/heartburn, genital sores, muscle weakness, difficulty walking, depression, abnormal bleeding, enlarged lymph nodes, and angioedema.    Physical Exam  General:  alert, well-developed, well-nourished, and well-hydrated white male in no distress.   Head:  Area of the right neck and ear with post-surgical change - well healed. There is an area of baldness behind the right ear Eyes:  vision grossly intact, pupils equal, and pupils round.   Nose:  no external deformity, no external erythema, and no nasal discharge.   Mouth:  no oral lesions, throat clear Neck:  post-surgical changes right neck. Full ROM Lungs:  normal respiratory effort, normal breath sounds, no crackles, and no wheezes.   Heart:  normal rate and regular rhythm.   Msk:  no joint tenderness and no joint swelling.   Pulses:  2+ radial pulse Neurologic:  alert & oriented X3, cranial nerves II-XII intact, strength normal in all  extremities, and gait normal.   Skin:  turgor normal, color normal, and no suspicious lesions.  Alpecia in the area behind the right ear Cervical Nodes:  no anterior cervical adenopathy and no posterior cervical adenopathy.   Psych:  Oriented X3, memory intact for recent and remote, normally interactive, and good eye contact.     Impression & Recommendations:  Problem # 1:  ALLERGIC RHINITIS CAUSE UNSPECIFIED (ICD-477.9) Ptient with congestion of the sinus without signs of infection. He is very sensitive to decongestants.  Plan - trial of fluticasone spray daily  His updated medication list for this problem includes:    Fluticasone Propionate 50 Mcg/act Susp (Fluticasone propionate) .Marland Kitchen... 1 spray per nostril once daily for allergic rhinnitis1  Problem # 2:  MALE INFERTILITY (ICD-606.9) He has had an abnormal sperm analysis sugesting hypogonadism. He does have erectile dysfunction.  Plan - testosterone level  Orders: TLB-Testosterone, Total (84403-TESTO)  addendum: testosterone 247.9 (350-890)  Problem # 3:  HYPOTHYROIDISM (ICD-244.9) For lab with recommendations to follow.  His updated medication list for this problem includes:    Synthroid 100 Mcg Tabs (Levothyroxine sodium) ..... Once daily  Orders: TLB-TSH (Thyroid Stimulating Hormone) (84443-TSH)  Addendum - TSH is normal.  Will continue present medical regimen  Problem # 4:  HYPERTENSION (ICD-401.9)  Orders: TLB-BMP (Basic Metabolic Panel-BMET) (80048-METABOL)  BP today: 128/82 Prior BP: 128/72 (01/02/2011)  Good control. Lab results are normal.  Problem # 5:  HYPERLIPIDEMIA (ICD-272.4) for routine lab with recommendations to follow. His updated medication list for this problem includes:    Vytorin 10-40 Mg Tabs (Ezetimibe-simvastatin) .Marland Kitchen... At bedtime  Orders: TLB-Lipid Panel (80061-LIPID)  Addendum - excellent control of lipids, good risk reduction in regard to coronary disease.  Plan continue present  medication.  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 He will continue to follow with oncology and ENT.  Complete Medication List: 1)  Vytorin 10-40 Mg Tabs (Ezetimibe-simvastatin) .... At bedtime 2)  Synthroid 100 Mcg Tabs (Levothyroxine sodium) .... Once daily 3)  Oxycodone-acetaminophen 5-325 Mg Tabs (Oxycodone-acetaminophen) .... Take 1 tablet by mouth three times a day. fill on or after 02/12/11 4)  Budeprion Sr 150 Mg Xr12h-tab (Bupropion hcl) .... 2 qdtab 5)  Trazodone Hcl 50 Mg Tabs (Trazodone hcl) .Marland Kitchen.. 1 or 2 by mouth at bedtime 6)  Citalopram Hydrobromide 20 Mg Tabs (Citalopram hydrobromide) .Marland Kitchen.. 1 by mouth once daily 7)  Fluticasone Propionate 50 Mcg/act Susp (Fluticasone propionate) .Marland Kitchen.. 1 spray per nostril once daily for allergic rhinnitis1 8)  Promethazine-codeine 6.25-10 Mg/82ml Syrp (Promethazine-codeine) .Marland Kitchen.. 1 tsp q 6 hrs as needed cough.  Michael Singleton Note: All result statuses are Final unless otherwise noted.  Tests: (1) Testosterone, Total (TESTO)   Testosterone         [L]  247.94 ng/dL                132.44-010.27  Tests: (2) Lipid Panel (LIPID)   Cholesterol               143 mg/dL  0-200     ATP III Classification            Desirable:  < 200 mg/dL                    Borderline High:  200 - 239 mg/dL               High:  > = 240 mg/dL   Triglycerides        [H]  239.0 mg/dL                 2.1-308.6     Normal:  <150 mg/dL     Borderline High:  578 - 199 mg/dL   HDL                  [L]  46.96 mg/dL                 >29.52   VLDL Cholesterol     [H]  47.8 mg/dL                  8.4-13.2  CHO/HDL Ratio:  CHD Risk                             4                    Men          Women     1/2 Average Risk     3.4          3.3     Average Risk          5.0          4.4     2X Average Risk          9.6          7.1     3X Average Risk           15.0          11.0                           Tests: (3) BMP (METABOL)   Sodium                    139 mEq/L                   135-145   Potassium                 4.8 mEq/L                   3.5-5.1   Chloride                  102 mEq/L                   96-112   Carbon Dioxide            29 mEq/L                    19-32   Glucose                   86 mg/dL                    44-01   BUN  8 mg/dL                     7-82   Creatinine                0.9 mg/dL                   9.5-6.2   Calcium                   9.4 mg/dL                   1.3-08.6   GFR                       95.46 mL/min                >60.00  Tests: (4) TSH (TSH)   FastTSH                   1.07 uIU/mL                 0.35-5.50  Tests: (5) Cholesterol LDL - Direct (DIRLDL)  Cholesterol LDL - Direct                             83.8 mg/dL     Optimal:  <578 mg/dL     Near or Above Optimal:  100-129 mg/dL     Borderline High:  469-629 mg/dL     High:  528-413 mg/dL     Very High:  >244 mg/dL  Patient Instructions: 1)  Sinus congestion - if it is allergic rhinnits the nasal steroid spray may help, but it takes 3-5 days of use to see improvement. If the spray does not work it is OK to take generic sudafed 30mg  two times a day or three times a day.  2)  Cough - phenergan with codein 1 tsp every 6 hours as needed. if this makes you too drowsy then use it at night and during the day use robitussin Dm or the equivalent. Let me know if the cough doesn't respond to treatment.  3)  Impotence - will check a testosterone level today - if low we will have a very good case for approval of medication . Prescriptions: OXYCODONE-ACETAMINOPHEN 5-325 MG TABS (OXYCODONE-ACETAMINOPHEN) Take 1 tablet by mouth three times a day. Fill on or after 02/12/11  #90 x 0   Entered and Authorized by:   Jacques Navy MD   Signed by:   Jacques Navy MD on 02/20/2011   Method used:   Handwritten   RxID:    [id number] PROMETHAZINE-CODEINE 6.25-10 MG/5ML SYRP (PROMETHAZINE-CODEINE) 1 tsp q 6 hrs as needed cough.  #8 oz x 1   Entered and Authorized by:   Jacques Navy MD   Signed by:   Jacques Navy MD on 02/20/2011   Method used:   Handwritten   RxID:   [id number] FLUTICASONE PROPIONATE 50 MCG/ACT SUSP (FLUTICASONE PROPIONATE) 1 spray per nostril once daily for allergic rhinnitis1  #1 x 12   Entered and Authorized by:   Jacques Navy MD   Signed by:   Jacques Navy MD on 02/20/2011   Method used:   Electronically to        [facility]  [address] [phone]* (retail)       [address]  [place], [place]  [address]       Ph: [phone] or [phone]       Fax: [phone]   RxID:   [id number]    Orders Added: 1)  TLB-Testosterone, Total [84403-TESTO] 2)  TLB-Lipid Panel [80061-LIPID] 3)  TLB-BMP (Basic Metabolic Panel-BMET) [80048-METABOL] 4)  TLB-TSH (Thyroid Stimulating Hormone) [84443-TSH] 5)  Est. Patient Level IV [27253]

## 2011-03-01 NOTE — Letter (Signed)
Summary: Generic Letter  [facility]  [address] [place], [place] [address]   Phone: [phone]  Fax: [phone]            02/22/2011  Michael Singleton [address] [place], [place]  [address]  [place]  To Whom It May Conceern,  For several years I have been treating 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  Laboratory evaluation revealed hypospermia several years ago. He recently had a testosterone level checked which reveals hypogonaidsm with a testosterone level of 257 with normal range being 350-890 in our lab.  If I can provide further information please do not hesitate to contact me.   Sincerely,   Illene Regulus MD

## 2011-03-06 LAB — CBC
HCT: 36.2 % — ABNORMAL LOW (ref 39.0–52.0)
Hemoglobin: 12.4 g/dL — ABNORMAL LOW (ref 13.0–17.0)
MCH: 29.2 pg (ref 26.0–34.0)
MCHC: 34.3 g/dL (ref 30.0–36.0)
MCV: 85.2 fL (ref 78.0–100.0)
Platelets: 245 10*3/uL (ref 150–400)
RBC: 4.25 MIL/uL (ref 4.22–5.81)
RDW: 13.1 % (ref 11.5–15.5)
WBC: 4.9 10*3/uL (ref 4.0–10.5)

## 2011-03-06 LAB — GLUCOSE, CAPILLARY: Glucose-Capillary: 97 mg/dL (ref 70–99)

## 2011-03-06 LAB — PROTIME-INR
INR: 0.94 (ref 0.00–1.49)
Prothrombin Time: 12.8 s (ref 11.6–15.2)

## 2011-03-06 LAB — CHROMOSOME ANALYSIS, BONE MARROW

## 2011-03-06 LAB — BONE MARROW EXAM

## 2011-03-07 LAB — CBC
MCV: 85.7 fL (ref 78.0–100.0)
MCV: 85.8 fL (ref 78.0–100.0)
Platelets: 210 10*3/uL (ref 150–400)
Platelets: 220 10*3/uL (ref 150–400)
RDW: 13.1 % (ref 11.5–15.5)
RDW: 13.5 % (ref 11.5–15.5)
WBC: 11 10*3/uL — ABNORMAL HIGH (ref 4.0–10.5)
WBC: 7.8 10*3/uL (ref 4.0–10.5)

## 2011-03-07 LAB — BASIC METABOLIC PANEL
BUN: 8 mg/dL (ref 6–23)
Creatinine, Ser: 0.77 mg/dL (ref 0.4–1.5)
GFR calc non Af Amer: >60 mL/min (ref >60)

## 2011-03-07 LAB — SURGICAL PCR SCREEN: MRSA, PCR: NEGATIVE

## 2011-03-09 LAB — BASIC METABOLIC PANEL
CO2: 30 mEq/L (ref 19–32)
Calcium: 8.5 mg/dL (ref 8.4–10.5)
Chloride: 109 mEq/L (ref 96–112)
Glucose, Bld: 96 mg/dL (ref 70–99)
Sodium: 142 mEq/L (ref 135–145)

## 2011-03-09 LAB — AFB CULTURE WITH SMEAR (NOT AT ARMC)

## 2011-03-09 LAB — FUNGUS CULTURE W SMEAR: Fungal Smear: NONE SEEN

## 2011-03-09 LAB — CBC
HCT: 36.1 % — ABNORMAL LOW (ref 39.0–52.0)
Hemoglobin: 11.7 g/dL — ABNORMAL LOW (ref 13.0–17.0)
MCH: 28.8 pg (ref 26.0–34.0)
MCHC: 32.4 g/dL (ref 30.0–36.0)
MCV: 88.9 fL (ref 78.0–100.0)

## 2011-03-09 LAB — TISSUE CULTURE: Culture: NO GROWTH

## 2011-03-09 LAB — QUANTIFERON TB GOLD ASSAY (BLOOD): Quantiferon Nil Value: 0.01 IU/mL

## 2011-03-10 LAB — CBC
Platelets: 216 10*3/uL (ref 150–400)
RBC: 4.75 MIL/uL (ref 4.22–5.81)
RDW: 13.5 % (ref 11.5–15.5)
WBC: 9.5 10*3/uL (ref 4.0–10.5)

## 2011-03-10 LAB — BASIC METABOLIC PANEL
BUN: 6 mg/dL (ref 6–23)
Calcium: 9.3 mg/dL (ref 8.4–10.5)
Creatinine, Ser: 0.85 mg/dL (ref 0.4–1.5)
GFR calc Af Amer: >60 mL/min (ref >60)

## 2011-03-10 LAB — HIGH SENSITIVITY CRP: CRP, High Sensitivity: 61.5 mg/L — ABNORMAL HIGH

## 2011-03-10 LAB — MISCELLANEOUS TEST

## 2011-03-10 LAB — DIFFERENTIAL
Basophils Absolute: 0 10*3/uL (ref 0.0–0.1)
Eosinophils Absolute: 0 10*3/uL (ref 0.0–0.7)
Eosinophils Relative: 1 % (ref 0–5)
Lymphocytes Relative: 14 % (ref 12–46)
Lymphs Abs: 1.3 10*3/uL (ref 0.7–4.0)
Neutrophils Relative %: 79 % — ABNORMAL HIGH (ref 43–77)

## 2011-03-10 LAB — COCCIDIOIDES ANTIBODIES: Coccidioides Ab CF: <1:2 {titer}

## 2011-03-10 LAB — HEPATITIS PANEL, ACUTE
HCV Ab: NEGATIVE
Hepatitis B Surface Ag: NEGATIVE

## 2011-03-10 LAB — HISTOPLASMA ANTIGEN, URINE

## 2011-03-10 LAB — LACTATE DEHYDROGENASE: LDH: 188 U/L (ref 94–250)

## 2011-03-10 LAB — CULTURE, ROUTINE-ABSCESS: Culture: NO GROWTH

## 2011-03-10 LAB — EPSTEIN-BARR VIRUS VCA ANTIBODY PANEL: EBV VCA IgM: 0.12 {ISR}

## 2011-03-10 LAB — ANA: Anti Nuclear Antibody(ANA): NEGATIVE

## 2011-03-10 LAB — HIV ANTIBODY (ROUTINE TESTING W REFLEX): HIV: NONREACTIVE

## 2011-03-10 LAB — ANAEROBIC CULTURE

## 2011-03-10 LAB — ANGIOTENSIN CONVERTING ENZYME: Angiotensin-Converting Enzyme: 32 U/L (ref 8–52)

## 2011-03-10 LAB — CMV IGM: CMV IgM: 0.44 (ref <0.90)

## 2011-03-16 ENCOUNTER — Ambulatory Visit: Payer: Medicare Other | Attending: Radiation Oncology | Admitting: Radiation Oncology

## 2011-03-22 ENCOUNTER — Other Ambulatory Visit: Payer: Self-pay | Admitting: Oncology

## 2011-03-22 DIAGNOSIS — C8590 Non-Hodgkin lymphoma, unspecified, unspecified site: Secondary | ICD-10-CM

## 2011-03-27 ENCOUNTER — Other Ambulatory Visit: Payer: Self-pay | Admitting: Oncology

## 2011-03-27 DIAGNOSIS — C8590 Non-Hodgkin lymphoma, unspecified, unspecified site: Secondary | ICD-10-CM

## 2011-04-09 ENCOUNTER — Encounter: Payer: Self-pay | Admitting: Internal Medicine

## 2011-04-10 ENCOUNTER — Ambulatory Visit (INDEPENDENT_AMBULATORY_CARE_PROVIDER_SITE_OTHER): Payer: Medicare Other | Admitting: Internal Medicine

## 2011-04-10 DIAGNOSIS — M79609 Pain in unspecified limb: Secondary | ICD-10-CM

## 2011-04-10 DIAGNOSIS — M79601 Pain in right arm: Secondary | ICD-10-CM

## 2011-04-10 DIAGNOSIS — C8581 Other specified types of non-Hodgkin lymphoma, lymph nodes of head, face, and neck: Secondary | ICD-10-CM

## 2011-04-10 MED ORDER — GABAPENTIN 100 MG PO CAPS: 100.0000 mg | ORAL_CAPSULE | Freq: Every day | ORAL | Status: DC

## 2011-04-10 NOTE — Patient Instructions (Signed)
Arm and chest pain - suspect this may  Be nerve damage either from radiation or possibly disk disease or degenerative joint disease. Plan is to take gabapentin - in an upward titrating patter as presecribed.  Weight management - portion size, avoid useless calories.

## 2011-04-11 ENCOUNTER — Encounter: Payer: Self-pay | Admitting: Internal Medicine

## 2011-04-11 ENCOUNTER — Other Ambulatory Visit: Payer: Self-pay | Admitting: Oncology

## 2011-04-11 ENCOUNTER — Other Ambulatory Visit (HOSPITAL_COMMUNITY): Payer: Self-pay

## 2011-04-11 ENCOUNTER — Encounter (HOSPITAL_BASED_OUTPATIENT_CLINIC_OR_DEPARTMENT_OTHER): Payer: Medicare Other | Admitting: Oncology

## 2011-04-11 ENCOUNTER — Ambulatory Visit (HOSPITAL_COMMUNITY)
Admission: RE | Admit: 2011-04-11 | Discharge: 2011-04-11 | Disposition: A | Discharge status: Home / Self Care | Payer: Medicare Other | Source: Ambulatory Visit | Attending: Oncology | Admitting: Oncology

## 2011-04-11 ENCOUNTER — Other Ambulatory Visit: Payer: Self-pay | Admitting: Internal Medicine

## 2011-04-11 ENCOUNTER — Inpatient Hospital Stay (HOSPITAL_COMMUNITY)
Admission: RE | Admit: 2011-04-11 | Discharge: 2011-04-11 | Payer: Medicare Other | Source: Ambulatory Visit | Attending: Oncology | Admitting: Oncology

## 2011-04-11 DIAGNOSIS — M5412 Radiculopathy, cervical region: Secondary | ICD-10-CM

## 2011-04-11 DIAGNOSIS — C8581 Other specified types of non-Hodgkin lymphoma, lymph nodes of head, face, and neck: Secondary | ICD-10-CM

## 2011-04-11 DIAGNOSIS — I708 Atherosclerosis of other arteries: Secondary | ICD-10-CM | POA: Insufficient documentation

## 2011-04-11 DIAGNOSIS — C8590 Non-Hodgkin lymphoma, unspecified, unspecified site: Secondary | ICD-10-CM

## 2011-04-11 DIAGNOSIS — K4090 Unilateral inguinal hernia, without obstruction or gangrene, not specified as recurrent: Secondary | ICD-10-CM | POA: Insufficient documentation

## 2011-04-11 DIAGNOSIS — I2510 Atherosclerotic heart disease of native coronary artery without angina pectoris: Secondary | ICD-10-CM | POA: Insufficient documentation

## 2011-04-11 DIAGNOSIS — R1310 Dysphagia, unspecified: Secondary | ICD-10-CM | POA: Insufficient documentation

## 2011-04-11 DIAGNOSIS — I700 Atherosclerosis of aorta: Secondary | ICD-10-CM | POA: Insufficient documentation

## 2011-04-11 DIAGNOSIS — C8589 Other specified types of non-Hodgkin lymphoma, extranodal and solid organ sites: Secondary | ICD-10-CM | POA: Insufficient documentation

## 2011-04-11 DIAGNOSIS — R0602 Shortness of breath: Secondary | ICD-10-CM | POA: Insufficient documentation

## 2011-04-11 DIAGNOSIS — I6529 Occlusion and stenosis of unspecified carotid artery: Secondary | ICD-10-CM | POA: Insufficient documentation

## 2011-04-11 DIAGNOSIS — Z452 Encounter for adjustment and management of vascular access device: Secondary | ICD-10-CM

## 2011-04-11 DIAGNOSIS — R209 Unspecified disturbances of skin sensation: Secondary | ICD-10-CM | POA: Insufficient documentation

## 2011-04-11 DIAGNOSIS — M542 Cervicalgia: Secondary | ICD-10-CM | POA: Insufficient documentation

## 2011-04-11 DIAGNOSIS — R51 Headache: Secondary | ICD-10-CM | POA: Insufficient documentation

## 2011-04-11 DIAGNOSIS — Z9221 Personal history of antineoplastic chemotherapy: Secondary | ICD-10-CM | POA: Insufficient documentation

## 2011-04-11 DIAGNOSIS — Z923 Personal history of irradiation: Secondary | ICD-10-CM | POA: Insufficient documentation

## 2011-04-11 DIAGNOSIS — K5730 Diverticulosis of large intestine without perforation or abscess without bleeding: Secondary | ICD-10-CM | POA: Insufficient documentation

## 2011-04-11 LAB — CBC WITH DIFFERENTIAL/PLATELET
BASO%: 0.2 % (ref 0.0–2.0)
Basophils Absolute: 0 10*3/uL (ref 0.0–0.1)
EOS%: 1.5 % (ref 0.0–7.0)
Eosinophils Absolute: 0.1 10*3/uL (ref 0.0–0.5)
HCT: 38.4 % (ref 38.4–49.9)
HGB: 13 g/dL (ref 13.0–17.1)
LYMPH%: 16.4 % (ref 14.0–49.0)
MCH: 28.1 pg (ref 27.2–33.4)
MCHC: 33.9 g/dL (ref 32.0–36.0)
MCV: 83 fL (ref 79.3–98.0)
MONO#: 0.6 10*3/uL (ref 0.1–0.9)
MONO%: 15.1 % — ABNORMAL HIGH (ref 0.0–14.0)
NEUT#: 2.8 10*3/uL (ref 1.5–6.5)
NEUT%: 66.8 % (ref 39.0–75.0)
Platelets: 199 10*3/uL (ref 140–400)
RBC: 4.63 10*6/uL (ref 4.20–5.82)
RDW: 14.8 % — ABNORMAL HIGH (ref 11.0–14.6)
WBC: 4.3 10*3/uL (ref 4.0–10.3)
lymph#: 0.7 10*3/uL — ABNORMAL LOW (ref 0.9–3.3)

## 2011-04-11 LAB — CMP (CANCER CENTER ONLY)
ALT(SGPT): 32 U/L (ref 10–47)
AST: 29 U/L (ref 11–38)
Albumin: 3.9 g/dL (ref 3.3–5.5)
Alkaline Phosphatase: 68 U/L (ref 26–84)
Calcium: 8.8 mg/dL (ref 8.0–10.3)
Chloride: 99 mEq/L (ref 98–108)
Creat: 1 mg/dl (ref 0.6–1.2)
Potassium: 4.3 mEq/L (ref 3.3–4.7)

## 2011-04-11 MED ORDER — IOHEXOL 300 MG/ML  SOLN
125.0000 mL | Freq: Once | INTRAMUSCULAR | Status: AC | PRN
  Administered 2011-04-11: 125 mL via INTRAVENOUS

## 2011-04-11 NOTE — Progress Notes (Signed)
Subjective:    Patient ID: Michael Singleton, male    DOB: 19-Dec-1956, 55 y.o.   MRN: [id number]  HPIMr. Waage is well known to me. He is s/p cervical lymphoma with radical neck dissection followed by adjuvant chemotherapy and radiation therapy. He has been doing well. He is due for follow-up CT and PET scan.  He presents with the c/o a burning/tingling sensation in the right UE from neck to forearm. He has soreness in the right upper chest wall. He denies any history of injury, strain or other precipitating event. He has had no loss of strength or function. He has noted mild diffuse erythema of this area. Past Medical History  Diagnosis Date  . 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 Inguinal hernia, left   . Lumbar disc disease   . Occlusion and stenosis of carotid artery without mention of cerebral infarction   . Allergic rhinitis, cause unspecified   . Lumbago   . Pain in joint, upper arm   . Pain in joint, lower leg   . Pain in joint, multiple sites   . Male infertility, unspecified   . Posttraumatic stress disorder   . Diverticulosis of colon (without mention of hemorrhage)   . Alcoholism in family   . Unspecified hypothyroidism   . Old myocardial infarction   . Obstructive sleep apnea (adult) (pediatric)   . Unspecified essential hypertension   . Hyperlipemia   . Coronary atherosclerosis of unspecified type of vessel, native or graft    Past Surgical History  Procedure Date  . Rotator cuff repair   . Ptcb stent   . Inguinal hernia repair   . I&d right neck for lympadenitis     Aug '11  . Radical neck dissection right     Oct '11 (Dr Lazarus Salines)   Family History  Problem Relation Age of Onset  . Cancer Mother   . Heart disease Mother   . Alcohol abuse Other    History   Social History  . Marital Status: Single    Spouse Name: N/A    Number of Children: N/A  . Years of Education: N/A   Occupational History  . Not on file.   Social  History Main Topics  . Smoking status: Not on file  . Smokeless tobacco: Not on file  . Alcohol Use: Not on file  . Drug Use: Not on file  . Sexually Active: Not on file   Other Topics Concern  . 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  Lives with SO. They  lost her son in a motorcycle accident by a drunk driver.  Close to his 5 year old granddaughter.Marriage is in good health.        Review of Systems Review of Systems  Constitutional:  Negative for fever, chills, activity change and unexpected weight change.  HENT:  Negative for hearing loss, ear pain, congestion, neck stiffness and postnasal drip.   Eyes: Negative for pain, discharge and visual disturbance.  Respiratory: Negative for chest tightness and wheezing.   Cardiovascular: Negative for chest pain and palpitations.       [No decreased exercise tolerance Gastrointestinal: [No change in bowel habit. No bloating or gas. No reflux or indigestion Genitourinary: Negative for urgency, frequency, flank pain and difficulty urinating.  Musculoskeletal: Positive for myalgias RUE and upper chest. Negative for back pain, arthralgias and gait problem.  Neurological:  Negative for dizziness, tremors, weakness and headaches. Positive for tingling/paresthesia RUE Hematological: Negative for adenopathy.  Psychiatric/Behavioral: Negative for behavioral problems and dysphoric mood.       Objective:   Physical Exam WNWD robust appearing white male who has gained back much of the weight he has lost. HEENT- well healed surgical site right neck. C&S clear Chest - CTAP Cor - RRR, no murmurs Neuro - A&O x 4, CN II-XII grossly intact. Decreased sensation to light touch and pin prick proximal right UE at medial aspect; decreased sensation right upper outer chest. MS normal in RUE, grip, shoulder girdle       Assessment & Plan:  1. Right arm pain - patient with a mild radiculopathy with  sensation change on the right. Concern for DDD/DJD cervical spine vs post-radiation changes with nerve compression.  Plan -  At up-coming CT scan have asked radiologist to evaluate C-Spine for DDD/DJD and nerve root compression.            Gabapentin for pain: step-wise upward titration (see Rx)

## 2011-04-13 ENCOUNTER — Other Ambulatory Visit: Payer: Self-pay | Admitting: Oncology

## 2011-04-13 ENCOUNTER — Encounter (HOSPITAL_BASED_OUTPATIENT_CLINIC_OR_DEPARTMENT_OTHER): Payer: Medicare Other | Admitting: Oncology

## 2011-04-13 DIAGNOSIS — E785 Hyperlipidemia, unspecified: Secondary | ICD-10-CM

## 2011-04-13 DIAGNOSIS — C8590 Non-Hodgkin lymphoma, unspecified, unspecified site: Secondary | ICD-10-CM

## 2011-04-13 DIAGNOSIS — C8581 Other specified types of non-Hodgkin lymphoma, lymph nodes of head, face, and neck: Secondary | ICD-10-CM

## 2011-04-13 DIAGNOSIS — E039 Hypothyroidism, unspecified: Secondary | ICD-10-CM

## 2011-04-13 DIAGNOSIS — R51 Headache: Secondary | ICD-10-CM

## 2011-04-15 ENCOUNTER — Telehealth: Payer: Self-pay | Admitting: Internal Medicine

## 2011-04-15 NOTE — Telephone Encounter (Signed)
CT neck with mild fibrosis and arthritis that may be the cause of pain. No indication for MRI or referral at this time. If the pain gets worse - rov

## 2011-04-16 NOTE — Telephone Encounter (Signed)
Patient informed. 

## 2011-04-24 ENCOUNTER — Other Ambulatory Visit (HOSPITAL_COMMUNITY): Payer: Medicare Other

## 2011-05-01 ENCOUNTER — Ambulatory Visit (HOSPITAL_COMMUNITY)
Admission: RE | Admit: 2011-05-01 | Discharge: 2011-05-01 | Disposition: A | Discharge status: Home / Self Care | Payer: Medicare Other | Source: Ambulatory Visit | Attending: Oncology | Admitting: Oncology

## 2011-05-01 DIAGNOSIS — Z87891 Personal history of nicotine dependence: Secondary | ICD-10-CM | POA: Insufficient documentation

## 2011-05-01 DIAGNOSIS — C8589 Other specified types of non-Hodgkin lymphoma, extranodal and solid organ sites: Secondary | ICD-10-CM | POA: Insufficient documentation

## 2011-05-01 DIAGNOSIS — J4489 Other specified chronic obstructive pulmonary disease: Secondary | ICD-10-CM | POA: Insufficient documentation

## 2011-05-01 DIAGNOSIS — Z452 Encounter for adjustment and management of vascular access device: Secondary | ICD-10-CM | POA: Insufficient documentation

## 2011-05-01 DIAGNOSIS — E039 Hypothyroidism, unspecified: Secondary | ICD-10-CM | POA: Insufficient documentation

## 2011-05-01 DIAGNOSIS — C8590 Non-Hodgkin lymphoma, unspecified, unspecified site: Secondary | ICD-10-CM

## 2011-05-01 DIAGNOSIS — J449 Chronic obstructive pulmonary disease, unspecified: Secondary | ICD-10-CM | POA: Insufficient documentation

## 2011-05-08 NOTE — Letter (Signed)
December 27, 2009    [facility]  [address]  [place], [place] [address]   RE:  SOLOMON, SKOWRONEK  MRN:  [id number]  /  DOB:  1956-11-29   To Whom It May Concern:   I follow Mr. Johndaniel Catlin for general medical care.  His problem list  includes fatigue, dyspnea, low back pain, polyarthritis, significant  anxiety, hypothyroid disease, coronary artery disease with a history of  myocardial infarction, obstructive sleep apnea, hypertension,  hyperlipidemia, significant depression.   I have reviewed Mr. Robb social history including the time he spent  in service.   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   If I can provide any additional information in regards to Mr. Anastacio  medical care, please do not hesitate to contact me.  In regards to a  formal opinion in regards to his psychological status, I would defer to  the psychologist that he has either seen in the past or would be happy  to arrange for him to have a repeat psychological evaluation in the  private sector.   I thank you very much for your consideration.   I remain yours truly,    Sincerely,      Francesco Provencal. Norins, MD  Electronically Signed    MEN/MedQ  DD: 12/27/2009  DT: 12/27/2009  Job #: [id number]

## 2011-05-11 NOTE — Assessment & Plan Note (Signed)
[facility]                             PULMONARY OFFICE NOTE   CRISTO, AUSBURN                        MRN:          [id number]  DATE:04/08/2007                            DOB:          09-19-1956    SUBJECTIVE:  I saw [name] today in followup after he had undergone  his overnight polysomnogram.   This was done on March 19, 2007, and this showed an overall  apnea/hypopnea index of 17.4 with an oxygen saturation nadir of 83%,  consistent with moderate obstructive sleep apnea.  He did, however, have  a significant positional as well as REM effect with his non-supine  apnea/hypopnea index of 2, versus a supine apnea/hypopnea index of 55.5.  His non-REM apnea/hypopnea index was 16.2 and his REM apnea/hypopnea  index was 63.5.   I have reviewed the results of his sleep study with Mr. [name] and with  his wife.  I had gone over again the average consequences of untreated  sleep apnea.  I had also re-emphasized the importance of diet, exercise  and weight reduction.  He has had dietary modification since his last  visit and has actually lost approximately 10 pounds since his last  visit.  He is having difficulty with exercise at the current time, due  to pain symptoms.  I discussed with him various techniques with regards  to an exercise regimen, as well as stretching exercises.  For his sleep  apnea what I have recommended is that he undergo positional therapy to  start with, in addition to continuing with his diet and exercise  program.  Then, depending upon his response to this, if he is still  having difficulty with sleep and excessive daytime sleepiness, then  further therapies may be warranted.  Additionally I will have him  undergo laboratory tests to check his iron levels, to determine if he  would need to have iron supplementation for his symptoms of restless leg  syndrome.   I will call him with the results of his blood tests and I will  follow up  with him in the office in approximately six weeks.     Coralyn Helling, MD  Electronically Signed    VS/MedQ  DD: 04/08/2007  DT: 04/08/2007  Job #: [id number]   cc:   Rosalyn Gess. Norins, MD

## 2011-05-11 NOTE — Procedures (Signed)
NAME:  Michael Singleton, Michael Singleton               ACCOUNT NO.:  [id number]   MEDICAL RECORD NO.:  [id number]          PATIENT TYPE:  OUT   LOCATION:  SLEEP CENTER                 FACILITY[facility]  [facility]   PHYSICIAN:  Coralyn Helling, MD        DATE OF BIRTH:  1956/01/19   DATE OF STUDY:  03/19/2007                            NOCTURNAL POLYSOMNOGRAM   REFERRING PHYSICIAN:   INDICATION FOR STUDY:  This is a gentleman who has symptoms of sleep  disruption and excessive daytime sleepiness.  He is referred to the  sleep lab for evaluation of obstructive sleep apnea.   EPWORTH SLEEPINESS SCORE:  8.   MEDICATIONS:  Wellbutrin. Altace, Vytorin, Synthroid and Vicodin.  The  patient took a Vicodin prior to arrival to the sleep lab.   SLEEP ARCHITECTURE:  Total recording time was 444 minutes.  Total sleep  time was 338 minutes.  Sleep efficiency is 76% which is slightly  reduced.  Sleep latency was 48 minutes which was prolonged.  REM latency  was 230 minutes which is prolonged.  The patient was observed in all  stages of sleep, but had a reduction in the percentage of slow-wave  sleep to 11% and reduction in percentage of REM sleep to 3%.  The  patient slept in both the supine and nonsupine position.   RESPIRATORY DATA:  The average respiratory rate was 12.  The overall  apnea/hypopnea index was 17.4.  There was one central event.  The  remainder of the events were obstructive in nature.  Moderate snoring  was noted by the technician.  The supine apnea/hypopnea index was 55.  The nonsupine apnea/hypopnea index was 2.  The REM apnea/hypopnea index  was 53.5.  The non-REM apnea/hypopnea index was 16.2.  The patient was  scheduled for a split night study protocol, but failed to meet protocol  criteria due to insufficient number of events in the beginning part of  the study.   OXYGEN DATA:  The baseline oxygenation was 94%.  The oxygen saturation  nadir 83%.  The patient spent a total of 425 minutes with an oxygen  saturation between 91-100% and 12.3 minutes with an oxygen saturation of  81-90%.   CARDIAC DATA:  The average heart rate was 63 and the rhythm strip showed  a normal sinus rhythm with occasional PVCs.   MOVEMENT-PARASOMNIA:  The periodic limb movement index was 2.7.  The  patient had one trip to the bathroom.   IMPRESSIONS-RECOMMENDATIONS:  This study shows evidence for moderate to  severe obstructive sleep apnea with an overall apnea/hypopnea index of  17.4 and oxygen saturation nadir of 83%.  The patient did have  significant positional as well as REM affect, although he had a very  minimal amount of REM sleep.  Positional therapy could be attempted  initially, however, given the severity of sleep apnea in supine position  as well as during  REM, I suspect he would need additional forms of therapy such as CPAP  therapy, oral appliance or surgical intervention.      Coralyn Helling, MD  Diplomat, American Board of Sleep Medicine  Electronically Signed  VS/MEDQ  D:  03/25/2007 16:06:46  T:  03/25/2007 19:33:27  Job:  [id number]

## 2011-05-11 NOTE — Assessment & Plan Note (Signed)
[facility]                           PRIMARY CARE OFFICE NOTE   Michael Singleton, Michael Singleton                        MRN:          [id number]  DATE:01/27/2007                            DOB:          Jan 29, 1956    HISTORY OF PRESENT ILLNESS:  [name] was last seen in the office in  December.  At that time, he complained of having problems with  significant snoring.  He also reports that he has significant daytime  somnolence including difficulty staying awake behind the wheel.  The  patient's wife reports that he does snore quite loudly and does have  some breath holding episodes.   PAST SURGICAL HISTORY:  1. Status post cardiac catheterization with PTCA and stenting.  2. Right shoulder arthroscopy for torn rotator cuff.   PAST MEDICAL HISTORY:  1. Hypothyroid disease.  2. Coronary artery disease.  3. Hyperlipidemia.   CANCEL THIS DICTATION     Rosalyn Gess. Norins, MD  Electronically Signed    MEN/MedQ  DD: 01/27/2007  DT: 01/27/2007  Job #: [id number]

## 2011-06-17 ENCOUNTER — Other Ambulatory Visit: Payer: Self-pay | Admitting: Internal Medicine

## 2011-06-19 ENCOUNTER — Other Ambulatory Visit: Payer: Self-pay | Admitting: Internal Medicine

## 2011-06-28 ENCOUNTER — Ambulatory Visit (INDEPENDENT_AMBULATORY_CARE_PROVIDER_SITE_OTHER): Payer: Medicare Other | Admitting: Internal Medicine

## 2011-06-28 DIAGNOSIS — E291 Testicular hypofunction: Secondary | ICD-10-CM

## 2011-06-28 DIAGNOSIS — M25511 Pain in right shoulder: Secondary | ICD-10-CM

## 2011-06-28 DIAGNOSIS — M25519 Pain in unspecified shoulder: Secondary | ICD-10-CM

## 2011-06-28 MED ORDER — TESTOSTERONE 5 MG/24HR TD PT24: 1.0000 | MEDICATED_PATCH | Freq: Every day | TRANSDERMAL | Status: DC

## 2011-06-28 MED ORDER — METHYLPREDNISOLONE ACETATE 80 MG/ML IJ SUSP: 40.0000 mg | Freq: Once | INTRAMUSCULAR | Status: DC

## 2011-06-28 NOTE — Progress Notes (Signed)
  Subjective:    Patient ID: Arish Redner, male    DOB: 04-Sep-1956, 55 y.o.   MRN: [id number]  HPI Mr. Bromell presents for increasing pain in the right shoulder with decreased ROM. He denies any new injury or overuse. It feels much like his left shoulder felt before repair. He denies any paresthesia left hand but does have some weakness which may be a function of pain.  He reports very marked decreased libido. He has had previous testosterone level in Feb '12 of 248 (nl [id number])  PMH, FamHx and SocHx reviewed for any changes and relevance.    Review of Systems Review of Systems  Constitutional:  Negative for fever, chills, activity change and unexpected weight change.  HEENT:  Negative for hearing loss, ear pain, congestion, neck stiffness and postnasal drip. Negative for sore throat or swallowing problems. Negative for dental complaints.   Eyes: Negative for vision loss or change in visual acuity.  Respiratory: Negative for chest tightness and wheezing.   Cardiovascular: Negative for chest pain and palpitation. No decreased exercise tolerance Gastrointestinal: No change in bowel habit. No bloating or gas. No reflux or indigestion Genitourinary: Negative for urgency, frequency, flank pain and difficulty urinating.  Musculoskeletal: Negative for myalgias, back pain and gait problem. Positivde for pain right shoulder Neurological: Negative for dizziness, tremors, weakness and headaches.  Hematological: Negative for adenopathy.  Psychiatric/Behavioral: Negative for behavioral problems and dysphoric mood.       Objective:   Physical Exam Vitals noted Gen'l - WNWD overweight white male in no acute distress MSK - normal passive ROM without click or limitation. Active ROM limited by pain.   Procedure Joint/bursal injection  Indication - localized pain-right shoulder Consent - informed verbal consent from patient after explanation of risks of bleeding and infection Prep - injection site  identified, prepped with betadine followed by alcohol. Med -    40 Mg depomedrol with    0.5 Cc 2% xylocain w/ epi Injection - bursa/joint space entered easily. Injected without difficulty. Patient tolerated this well. Post-procedure - patient with rapid reduction in discomfort. Bandaid applied. Routine precautions provided including instruction to return for fever, drainage or increased pain        Assessment & Plan:  Right shoulder pain - bursitis vs intraarticular derangement. Did respond well to cortisone injection.  Plan - refer to ortho.

## 2011-06-28 NOTE — Assessment & Plan Note (Signed)
Patient with on-going decreased libido and interested in treatment for low T.  Plan - androderm patch 5mg / apply daily

## 2011-07-02 ENCOUNTER — Telehealth: Payer: Self-pay | Admitting: *Deleted

## 2011-07-02 NOTE — Telephone Encounter (Signed)
Informed tammy at [facility]

## 2011-07-02 NOTE — Telephone Encounter (Signed)
4 mg is ok

## 2011-07-02 NOTE — Telephone Encounter (Signed)
Received a fax from [facility] phone [phone]. Androderm 5 mg/ 24 hr patch has been discontinued. The 4 mg / 24 hour is availabe. Would you like to switch to the 4 mg or to something else?

## 2011-07-06 ENCOUNTER — Telehealth: Payer: Self-pay | Admitting: *Deleted

## 2011-07-06 NOTE — Telephone Encounter (Signed)
Pt is needing a prior auth on androderm patch. I have started the PA. But spoke with pt and his significant other and suggested the testosterone cypionate shot (insurance will cover this medication) and pt states he would like to try this medication. Please Advise?

## 2011-07-06 NOTE — Telephone Encounter (Signed)
Written Rx for multidose vial - 10 cc and then nurse visit for shot

## 2011-07-09 ENCOUNTER — Other Ambulatory Visit: Payer: Self-pay | Admitting: *Deleted

## 2011-07-09 MED ORDER — TESTOSTERONE CYPIONATE 200 MG/ML IM SOLN: 200.0000 mg | INTRAMUSCULAR | Status: AC

## 2011-07-09 NOTE — Telephone Encounter (Signed)
Prescription faxed in for pt and pt set up for nurse visit on 07/10/2011

## 2011-07-10 ENCOUNTER — Ambulatory Visit: Payer: Medicare Other

## 2011-07-11 ENCOUNTER — Ambulatory Visit: Payer: Medicare Other | Admitting: *Deleted

## 2011-07-20 ENCOUNTER — Other Ambulatory Visit: Payer: Self-pay | Admitting: Internal Medicine

## 2011-07-20 DIAGNOSIS — I6529 Occlusion and stenosis of unspecified carotid artery: Secondary | ICD-10-CM

## 2011-07-23 ENCOUNTER — Encounter (INDEPENDENT_AMBULATORY_CARE_PROVIDER_SITE_OTHER): Payer: Medicare Other | Admitting: *Deleted

## 2011-07-23 ENCOUNTER — Encounter: Payer: Self-pay | Admitting: Internal Medicine

## 2011-07-23 DIAGNOSIS — I6529 Occlusion and stenosis of unspecified carotid artery: Secondary | ICD-10-CM

## 2011-08-20 ENCOUNTER — Other Ambulatory Visit: Payer: Self-pay | Admitting: Internal Medicine

## 2011-08-24 NOTE — Telephone Encounter (Signed)
Ok for refill x 5 

## 2011-09-24 ENCOUNTER — Encounter: Payer: Self-pay | Admitting: Internal Medicine

## 2011-10-11 ENCOUNTER — Other Ambulatory Visit (HOSPITAL_COMMUNITY): Payer: Medicare Other

## 2011-10-12 ENCOUNTER — Ambulatory Visit (HOSPITAL_BASED_OUTPATIENT_CLINIC_OR_DEPARTMENT_OTHER): Payer: Medicare Other | Admitting: Oncology

## 2011-10-12 DIAGNOSIS — C8590 Non-Hodgkin lymphoma, unspecified, unspecified site: Secondary | ICD-10-CM

## 2011-10-12 DIAGNOSIS — E785 Hyperlipidemia, unspecified: Secondary | ICD-10-CM

## 2011-10-12 DIAGNOSIS — Z856 Personal history of leukemia: Secondary | ICD-10-CM

## 2011-10-12 DIAGNOSIS — I10 Essential (primary) hypertension: Secondary | ICD-10-CM

## 2011-10-12 DIAGNOSIS — C8589 Other specified types of non-Hodgkin lymphoma, extranodal and solid organ sites: Secondary | ICD-10-CM

## 2011-10-13 ENCOUNTER — Other Ambulatory Visit: Payer: Self-pay | Admitting: Oncology

## 2011-10-27 ENCOUNTER — Other Ambulatory Visit: Payer: Self-pay | Admitting: Internal Medicine

## 2011-12-25 HISTORY — PX: INGUINAL HERNIA REPAIR: SUR1180

## 2012-01-29 ENCOUNTER — Telehealth: Payer: Self-pay | Admitting: Oncology

## 2012-01-29 NOTE — Telephone Encounter (Signed)
Lvm advising lab/md appt on 04/11/12 !@ 9am. I have also mailed pt an appt calendar.

## 2012-01-31 ENCOUNTER — Other Ambulatory Visit: Payer: Self-pay | Admitting: Cardiology

## 2012-01-31 DIAGNOSIS — I6529 Occlusion and stenosis of unspecified carotid artery: Secondary | ICD-10-CM

## 2012-02-01 ENCOUNTER — Encounter: Payer: Medicare Other | Admitting: *Deleted

## 2012-02-01 ENCOUNTER — Encounter (INDEPENDENT_AMBULATORY_CARE_PROVIDER_SITE_OTHER): Payer: Medicare Other | Admitting: *Deleted

## 2012-02-01 DIAGNOSIS — I6529 Occlusion and stenosis of unspecified carotid artery: Secondary | ICD-10-CM

## 2012-04-11 ENCOUNTER — Other Ambulatory Visit (HOSPITAL_BASED_OUTPATIENT_CLINIC_OR_DEPARTMENT_OTHER): Payer: Medicare Other | Admitting: Lab

## 2012-04-11 ENCOUNTER — Encounter: Payer: Self-pay | Admitting: Oncology

## 2012-04-11 ENCOUNTER — Telehealth: Payer: Self-pay | Admitting: Oncology

## 2012-04-11 ENCOUNTER — Ambulatory Visit (HOSPITAL_BASED_OUTPATIENT_CLINIC_OR_DEPARTMENT_OTHER): Payer: Medicare Other | Admitting: Oncology

## 2012-04-11 VITALS — BP 127/66 | HR 51 | Temp 96.8°F | Ht 72.0 in | Wt 271.2 lb

## 2012-04-11 DIAGNOSIS — C8581 Other specified types of non-Hodgkin lymphoma, lymph nodes of head, face, and neck: Secondary | ICD-10-CM | POA: Diagnosis not present

## 2012-04-11 DIAGNOSIS — R911 Solitary pulmonary nodule: Secondary | ICD-10-CM | POA: Diagnosis not present

## 2012-04-11 DIAGNOSIS — C8590 Non-Hodgkin lymphoma, unspecified, unspecified site: Secondary | ICD-10-CM

## 2012-04-11 DIAGNOSIS — C8589 Other specified types of non-Hodgkin lymphoma, extranodal and solid organ sites: Secondary | ICD-10-CM | POA: Diagnosis not present

## 2012-04-11 LAB — COMPREHENSIVE METABOLIC PANEL
AST: 20 U/L (ref 0–37)
BUN: 10 mg/dL (ref 6–23)
Calcium: 9 mg/dL (ref 8.4–10.5)
Chloride: 104 mEq/L (ref 96–112)
Creatinine, Ser: 0.94 mg/dL (ref 0.50–1.35)
Total Bilirubin: 0.4 mg/dL (ref 0.3–1.2)

## 2012-04-11 LAB — CBC WITH DIFFERENTIAL/PLATELET
Basophils Absolute: 0 10*3/uL (ref 0.0–0.1)
Eosinophils Absolute: 0.2 10*3/uL (ref 0.0–0.5)
HCT: 38.2 % — ABNORMAL LOW (ref 38.4–49.9)
HGB: 12.9 g/dL — ABNORMAL LOW (ref 13.0–17.1)
MCV: 87.3 fL (ref 79.3–98.0)
NEUT#: 2.9 10*3/uL (ref 1.5–6.5)
RDW: 13.4 % (ref 11.0–14.6)
lymph#: 1.2 10*3/uL (ref 0.9–3.3)

## 2012-04-11 LAB — LACTATE DEHYDROGENASE: LDH: 136 U/L (ref 94–250)

## 2012-04-11 NOTE — Telephone Encounter (Signed)
gv pt appt schedule for oct.  °

## 2012-04-11 NOTE — Progress Notes (Signed)
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  IPI score of 0 or good prognosis.   PAST THERAPY: s/p R-CHOP x 2 cycles finished on 12/20/2010.  He is also s/p consolidative radiation therapy.  He achieved complete response.  CURRENT THERAPY: Watchful observation.  INTERVAL HISTORY: Michael Singleton 55 y.o. male returns for routine visit with his wife.  He reports that he feels well. He denies any palpable lymph node swelling in the neck, supraclavicular, axillary, inguinal, groin, or anywhere else.  Has a left inguinal hernia causing some pain. Due to see a surgeon about this next week. He complains of being more fatigued, but admits to being sedentary over the past few month and has recently been getting out in the yard and doing more work. He has chronic low back pain that has been getting worse for the last few years and getting worse with activity.  With rest the pain gets better with over-the-counter pain medications or with Vicodin.  No drenching night sweats. He denies any headache, confusion, seizure, visual changes, dysphagia, odynophagia, neck swelling, lymph node swelling, chest pain, palpitations, abdominal pain, abdominal swelling, melena, hematochezia, hematuria.  The rest of the 14-point review of systems was negative.  Past Medical History  Diagnosis Date  . 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 Inguinal hernia, left   . Lumbar disc disease   . Occlusion and stenosis of carotid artery without mention of cerebral infarction   . Allergic rhinitis, cause unspecified   . Lumbago   . Pain in joint, upper arm   . Pain in joint, lower leg   . Pain in joint, multiple sites   . Male infertility, unspecified   . Posttraumatic stress disorder   . Diverticulosis of colon  (without mention of hemorrhage)   . Alcoholism in family   . Unspecified hypothyroidism   . Old myocardial infarction   . Obstructive sleep apnea (adult) (pediatric)   . Unspecified essential hypertension   . Hyperlipemia   . Coronary atherosclerosis of unspecified type of vessel, native or graft     Past Surgical History  Procedure Date  . Rotator cuff repair   . Ptcb stent   . Inguinal hernia repair   . I&d right neck for lympadenitis     Aug '11  . Radical neck dissection right     Oct '11 (Dr Lazarus Salines)    Current Outpatient Prescriptions  Medication Sig Dispense Refill  . citalopram (CELEXA) 20 MG tablet TAKE 1 TABLET DAILY  90 tablet  1  . HYDROcodone-acetaminophen (VICODIN) 5-500 MG per tablet Take 1 tablet by mouth 3 (three) times daily as needed.      Marland Kitchen levothyroxine (SYNTHROID) 100 MCG tablet Take 100 mcg by mouth daily.        . simvastatin (ZOCOR) 10 MG tablet Take 10 mg by mouth at bedtime.      . traZODone (DESYREL) 50 MG tablet TAKE 1 TO 2 TABLETS BY MOUTH AT BEDTIME  180 tablet  5   Current Facility-Administered Medications  Medication Dose Route Frequency Provider Last Rate Last Dose  . DISCONTD: methylPREDNISolone acetate (DEPO-MEDROL) injection 40 mg  40 mg Intra-articular Once Jacques Navy, MD        ALLERGIES:   has no known allergies.  REVIEW OF SYSTEMS:  The rest of the 14-point review of system was negative.   Filed Vitals:   04/11/12 0925  BP: 127/66  Pulse: 51  Temp: 96.8 F (36 C)   Wt Readings from Last 3 Encounters:  04/11/12 271 lb 3.2 oz (123.016 kg)  06/28/11 267 lb (121.11 kg)  04/10/11 266 lb (120.657 kg)   ECOG Performance status: 1  PHYSICAL EXAMINATION:  General:  well-nourished in no acute distress.  Eyes:  no scleral icterus.  ENT:  There were no oropharyngeal lesions.  Neck was without thyromegaly.   He had a right cervical neck surgical scar that was well healed.  .Lymphatics:  Negative cervical, supraclavicular or  axillary adenopathy.  Respiratory: lungs were clear bilaterally without wheezing or crackles.  Cardiovascular:  Regular rate and rhythm, S1/S2, without murmur, rub or gallop.  There was no pedal edema.  GI:  abdomen was soft, flat, nontender, nondistended, without organomegaly.  Muscoloskeletal:  no spinal tenderness of palpation of vertebral spine.  He has low back pain upon laying back down.  Skin exam was without echymosis, petichae.  Neuro exam was nonfocal.  Patient was able to get on and off exam table without assistance.  Gait was normal.  Patient was alerted and oriented.  Attention was good.   Language was appropriate.  Mood was normal without depression.  Speech was not pressured.  Thought content was not tangential.    LABORATORY/RADIOLOGY DATA:  Lab Results  Component Value Date   WBC 4.3 04/11/2011   HGB 13.0 04/11/2011   HCT 38.4 04/11/2011   PLT 199 04/11/2011   GLUCOSE 92 04/11/2011   CHOL 143 02/20/2011   TRIG 239.0* 02/20/2011   HDL 34.10* 02/20/2011   LDLDIRECT 83.8 02/20/2011   ALKPHOS 68 04/11/2011   ALT 28 12/20/2010   AST 29 04/11/2011   NA 140 04/11/2011   K 4.3 04/11/2011   CL 99 04/11/2011   CREATININE 1.0 04/11/2011   BUN 7 04/11/2011   CO2 26 04/11/2011   INR 0.94 11/06/2010   Labs performed at [facility] on 03/31/12 and attempting to obtain copies for our records.  PET/CT scan performed on 04/08/12 at [facility]. Full report scanned.  No hypermetabolic activity in the neck, chest, abdomen, or pelvis. Small 4mm lower lobe nodules and small nodular focus of consolidation in the right lung base.  ASSESSMENT AND PLAN:  1. History of lymphoma:  I discussed with [name] that he does not have any evidence of recurrent disease on today's clinical history, physical exam, laboratory tests, and recent PET scan.  Recommendation is 37-month followup with imaging with CT of the neck, chest, abdomen, and pelvis.  Per NCCN guideline is CT scan every 6 months until 2-year mark, which will be November 2013.   After that, it will be a yearly CT scan for 5 years total unless there are concerning symptoms that require staging sooner.   He preferred to get her scan at the [place] and orders were written and given to the patient and wife. 2. Small lung nodules: 4 mm nodules noted on PET scan at [facility]. Will continue observation of these nodules. Repeat CT scan in ordered in 6 months. Patient is asymptomatic from these nodules. 3. Hyperlipidemia:  He is on Zocor per [place].  4. Hypothyroidism:  He is on levothyroxine per [place].  5. Depression:  Well controlled on citalopram per PCP.  6. Primary care:  He said that he is due for another colonoscopy in about  2013 or 2014.  The last one was negative per his report. 7. Follow up: In 6 months. He will have a CT of the neck/chest/abdomen/pelvis and labs at the [place] prior to his visit.   The patient was seen and examined with Dr Gaylyn Rong. >90% of plan of care developed by Dr Gaylyn Rong. The length of time of the face-to-face encounter was 30 minutes. More than 50% of time was spent counseling and coordination of care.

## 2012-06-27 IMAGING — CT CT BIOPSY
2 of 4 series · 20 of 32 positions shown, 26 images · non-contrast
Comparison: none

CLINICAL DATA: Lymphoma.

CT GUIDED DEEP ILIAC BONE ASPIRATION AND CORE BIOPSY:
TECHNIQUE: Patient was placed supine on the CT gantry and limited
axial scans through the pelvis were obtained. Appropriate skin
entry site was identified. Skin site was marked, prepped with
Betadine,   draped in usual sterile fashion, and infiltrated
locally with 1% lidocaine. Intravenous fentanyl and Versed were
administered as conscious sedation during continuous
cardiorespiratory monitoring by the radiology RN, with a total
moderate sedation time of 15 minutes.

[Series 2: bone marrow bx · axial · 0.76mm/px · z∈[-132,-88]mm · 4 of 27 slices shown]
[im 3/27  bone]
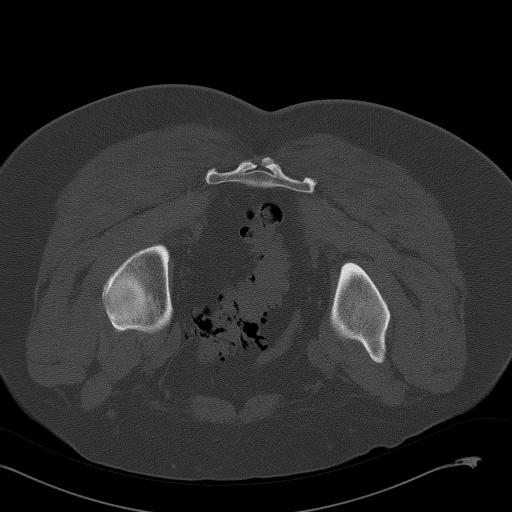
[im 6/27  bone]
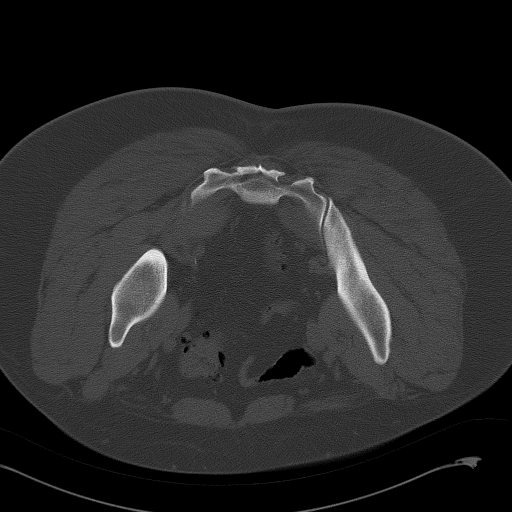
[im 9/27  bone]
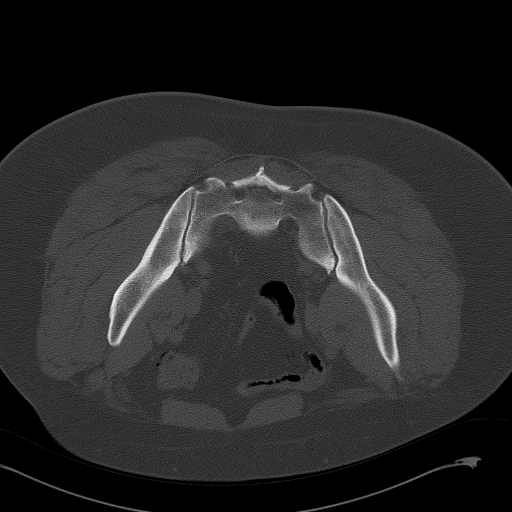
[im 12/27  bone]
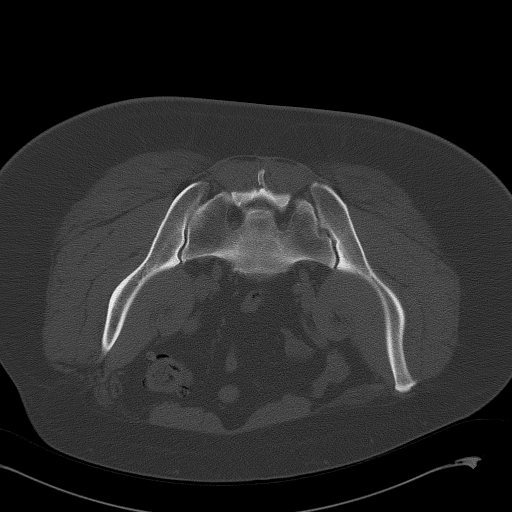

[Series 4: (hospital) 6.0 b30f · axial · 0.74mm/px · z∈[-80,-70]mm · 16 of 44 slices shown, 22 images]
[im 3/44  soft-tissue]
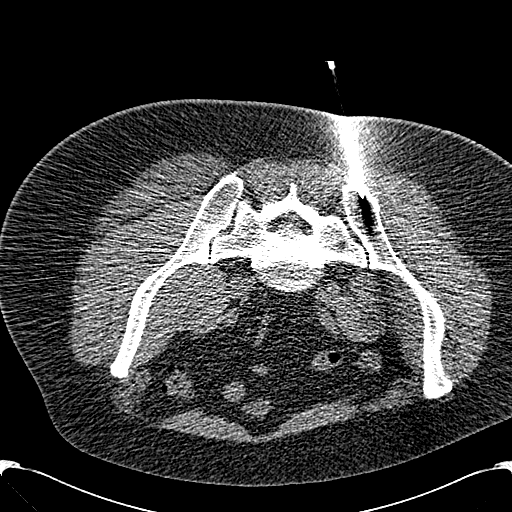
[im 3/44  bone]
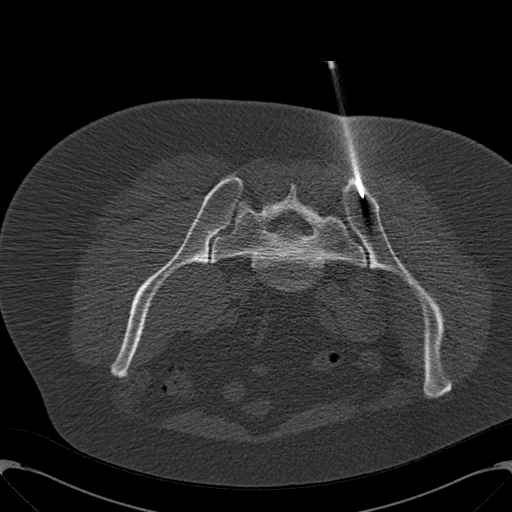
[im 6/44  soft-tissue]
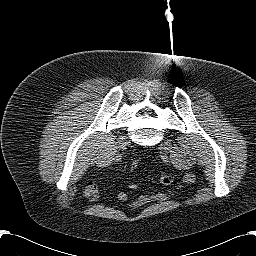
[im 8/44  soft-tissue]
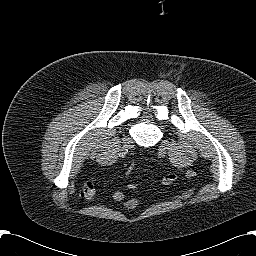
[im 11/44  soft-tissue]
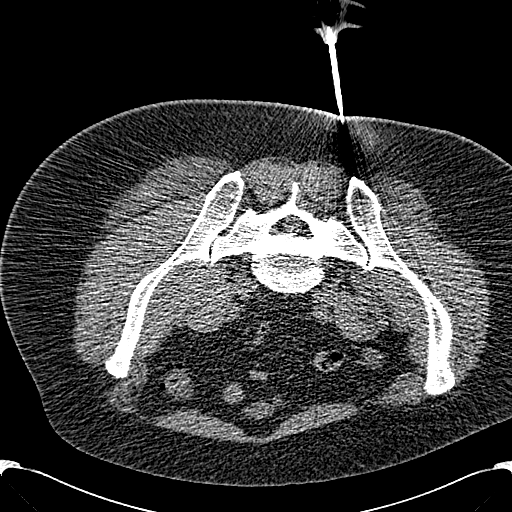
[im 13/44  soft-tissue]
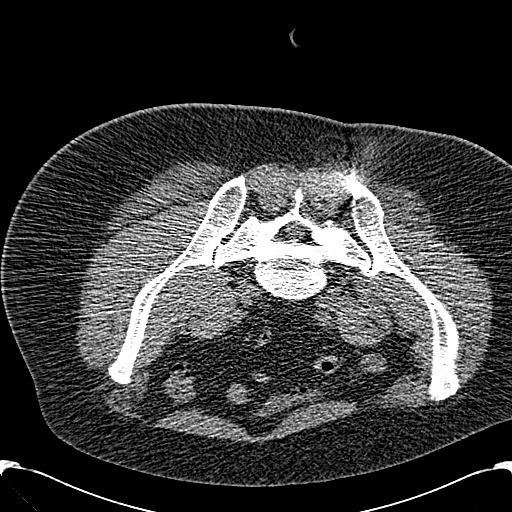
[im 16/44  soft-tissue]
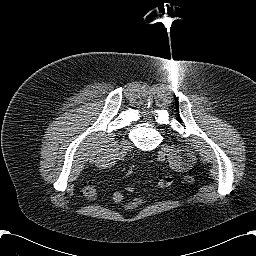
[im 18/44  soft-tissue]
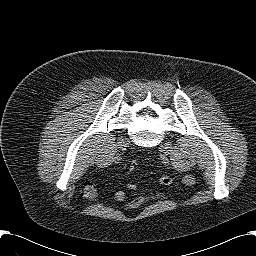
[im 21/44  soft-tissue]
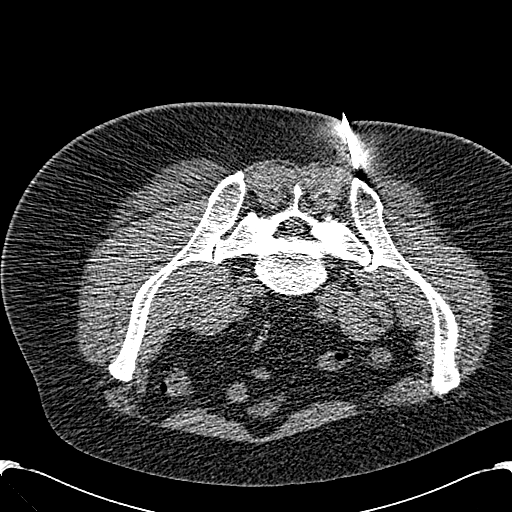
[im 23/44  soft-tissue]
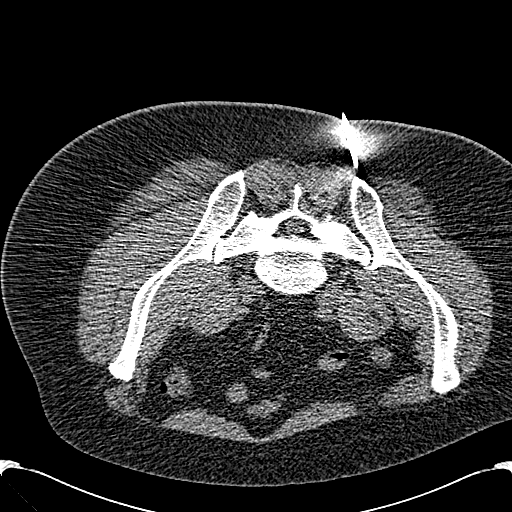
[im 23/44  bone]
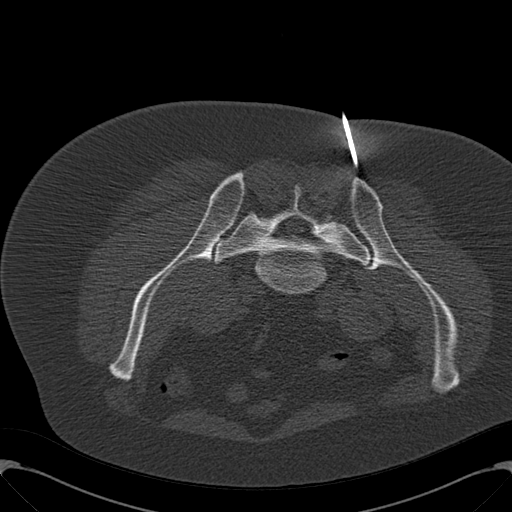
[im 26/44  soft-tissue]
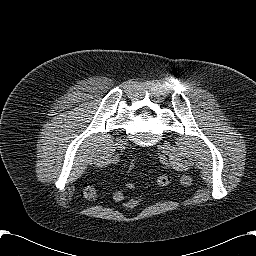
[im 28/44  soft-tissue]
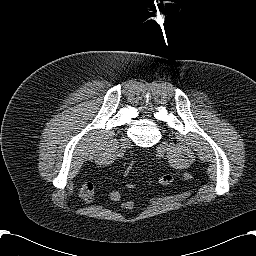
[im 31/44  soft-tissue]
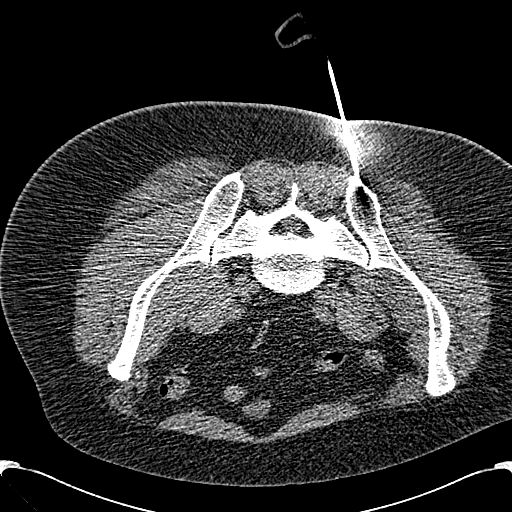
[im 33/44  soft-tissue]
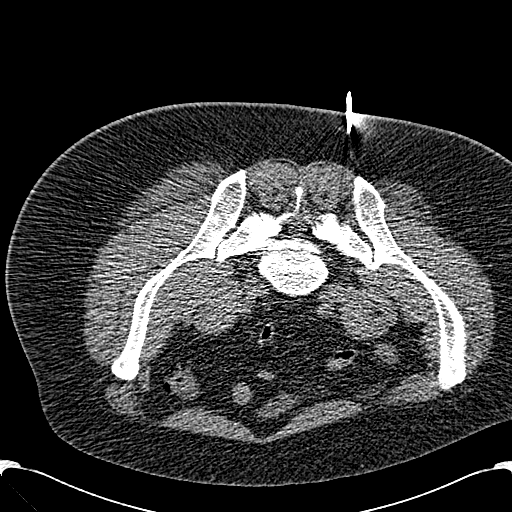
[im 33/44  lung]
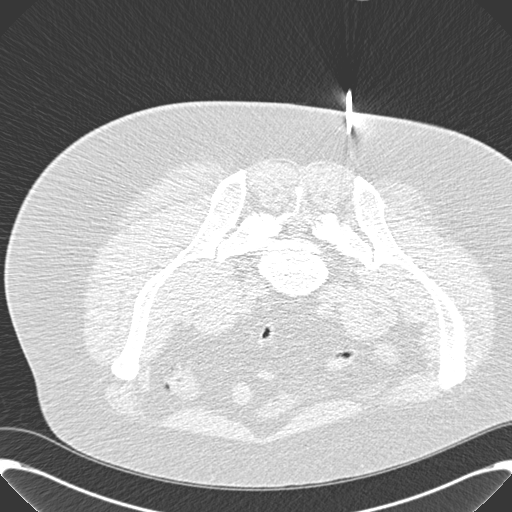
[im 36/44  soft-tissue]
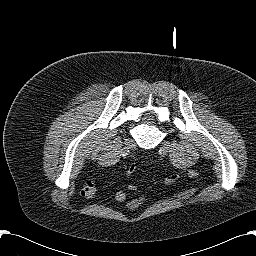
[im 36/44  lung]
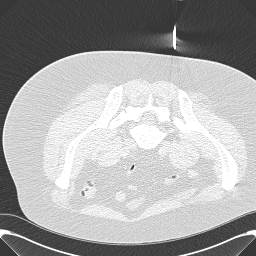
[im 38/44  soft-tissue]
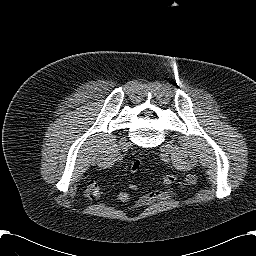
[im 38/44  lung]
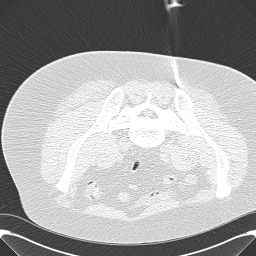
[im 41/44  soft-tissue]
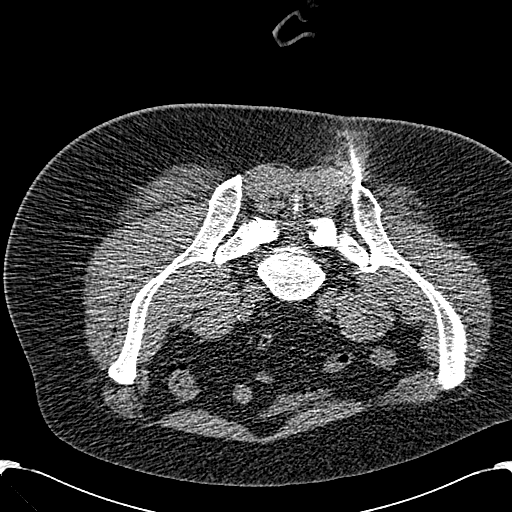
[im 41/44  lung]
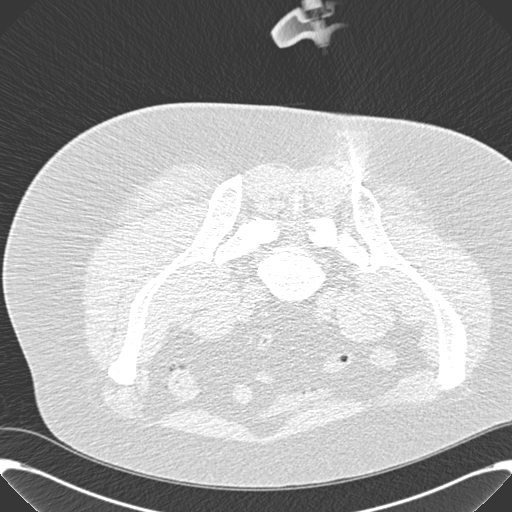

[20 of 32 positions shown; findings below may reference images not displayed]

Under CT fluoroscopic guidance an 11-gauge Cook trocar bone needle
was advanced into the right iliac bone just lateral to the
sacroiliac joint. Once needle tip position was confirmed, coaxial
core and aspiration samples were obtained. The final sample was
obtained using the guiding needle itself, which was then removed.
Postprocedure scans show no hematoma or fracture. Patient tolerated
procedure well, with no immediate complication.
IMPRESSION: 1. Technically successful CT guided right iliac bone core and
aspiration biopsy.

## 2012-08-31 NOTE — Progress Notes (Signed)
Not applicable.  Before EPIC gone live.  

## 2012-10-08 ENCOUNTER — Telehealth: Payer: Self-pay | Admitting: *Deleted

## 2012-10-08 NOTE — Telephone Encounter (Signed)
per conversation with patient moved appointment to 10-23-2012 at 9:45am

## 2012-10-10 ENCOUNTER — Ambulatory Visit: Payer: Medicare Other | Admitting: Oncology

## 2012-10-21 NOTE — Patient Instructions (Addendum)
1.  History of diffuse large B-cell lymphoma. 2.  Impression:  Continue to be in remission. 3.  Follow up:  Every 6 months exam; and yearly scan.

## 2012-10-23 ENCOUNTER — Ambulatory Visit (HOSPITAL_BASED_OUTPATIENT_CLINIC_OR_DEPARTMENT_OTHER): Payer: Medicare Other | Admitting: Oncology

## 2012-10-23 ENCOUNTER — Telehealth: Payer: Self-pay | Admitting: Oncology

## 2012-10-23 VITALS — BP 134/75 | HR 61 | Temp 96.7°F | Resp 20 | Ht 72.0 in | Wt 256.1 lb

## 2012-10-23 DIAGNOSIS — C8581 Other specified types of non-Hodgkin lymphoma, lymph nodes of head, face, and neck: Secondary | ICD-10-CM | POA: Diagnosis not present

## 2012-10-23 DIAGNOSIS — E039 Hypothyroidism, unspecified: Secondary | ICD-10-CM | POA: Diagnosis not present

## 2012-10-23 DIAGNOSIS — E785 Hyperlipidemia, unspecified: Secondary | ICD-10-CM

## 2012-10-23 NOTE — Telephone Encounter (Signed)
appts made and printed for pt,pt req to wait on making the one yr appt and will make it at the 40mo appt    aom

## 2012-10-23 NOTE — Progress Notes (Signed)
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 IPI score of 0 or good prognosis.   PAST THERAPY: s/p R-CHOP x 2 cycles finished on 12/20/2010. He is also s/p consolidative radiation therapy. He achieved complete response.   CURRENT THERAPY: Watchful observation.  INTERVAL HISTORY: Michael Singleton 56 y.o. male returns for regular follow up with his wife.  He reports feeling well.  He and his wife have been working on their house the past 6 months.  Patient denies fever, anorexia, weight loss, fatigue, headache, visual changes, confusion, drenching night sweats, palpable lymph node swelling, mucositis, odynophagia, dysphagia, nausea vomiting, jaundice, chest pain, palpitation, shortness of breath, dyspnea on exertion, productive cough, gum bleeding, epistaxis, hematemesis, hemoptysis, abdominal pain, abdominal swelling, early satiety, melena, hematochezia, hematuria, skin rash, spontaneous bleeding, joint swelling, joint pain, heat or cold intolerance, bowel bladder incontinence, back pain, focal motor weakness, paresthesia, depression, suicidal or homicidal ideation, feeling hopelessness.   Past Medical History  Diagnosis Date  . Recurrent EBV mediated lymphoma of tonsil   . Inguinal hernia, left   . Lumbar disc disease   . Carotid artery stenosis, asymptomatic   . Allergic rhinitis, cause unspecified   . Lumbago   . Pain in joint, upper arm   . Anterior knee pain   . Pain in joint, multiple sites   . Male infertility, unspecified   . Posttraumatic stress disorder   . Diverticulosis of colon (without mention of hemorrhage)   . Alcoholism in family   . Unspecified hypothyroidism   . Old myocardial infarction   . Obstructive sleep apnea (adult) (pediatric)   . Uncontrolled hypertension as indication for native  nephrectomy   . Hyperlipemia   . Coronary atherosclerosis of unspecified type of vessel, native or graft     Past Surgical History  Procedure Date  . Rotator cuff repair   . Ptcb stent   . Inguinal hernia repair   . I&d right neck for lympadenitis     Aug '11  . Radical neck dissection right     Oct '11 (Dr Lazarus Salines)    Current Outpatient Prescriptions  Medication Sig Dispense Refill  . buPROPion (WELLBUTRIN XL) 300 MG 24 hr tablet Take 300 mg by mouth daily.      . citalopram (CELEXA) 20 MG tablet TAKE 1 TABLET DAILY  90 tablet  1  . colesevelam (WELCHOL) 625 MG tablet Take 1,875 mg by mouth. Take 3 tablets once daily      . levothyroxine (SYNTHROID) 100 MCG tablet Take 100 mcg by mouth daily.        . traZODone (DESYREL) 50 MG tablet TAKE 1 TO 2 TABLETS BY MOUTH AT BEDTIME  180 tablet  5    ALLERGIES:   has no known allergies.  REVIEW OF SYSTEMS:  The rest of the 14-point review of system was negative.   Filed Vitals:   10/23/12 0951  BP: 134/75  Pulse: 61  Temp: 96.7 F (35.9 C)  Resp: 20   Wt Readings from Last 3 Encounters:  10/23/12 256 lb 1.6 oz (116.166 kg)  04/11/12 271 lb 3.2 oz (123.016 kg)  06/28/11 267 lb (121.11 kg)   ECOG Performance status: 0  PHYSICAL EXAMINATION:   General:  well-nourished man, in no acute distress.  Eyes:  no scleral icterus.  ENT:  There were  no oropharyngeal lesions.  Neck was without thyromegaly.  Lymphatics:  Negative cervical, supraclavicular or axillary adenopathy.  Right neck was fibrotic due to past resection.  There was no severe restricted range of motion.  Respiratory: lungs were clear bilaterally without wheezing or crackles.  Cardiovascular:  Regular rate and rhythm, S1/S2, without murmur, rub or gallop.  There was no pedal edema.  GI:  abdomen was soft, flat, nontender, nondistended, without organomegaly.  Muscoloskeletal:  no spinal tenderness of palpation of vertebral spine.  Skin exam was without echymosis, petichae.   Neuro exam was nonfocal.  Patient was able to get on and off exam table without assistance.  Gait was normal.  Patient was alerted and oriented.  Attention was good.   Language was appropriate.  Mood was normal without depression.  Speech was not pressured.  Thought content was not tangential.      LABORATORY/RADIOLOGY DATA:  Lab Results  Component Value Date   WBC 5.0 04/11/2012   HGB 12.9* 04/11/2012   HCT 38.2* 04/11/2012   PLT 187 04/11/2012   GLUCOSE 88 04/11/2012   CHOL 143 02/20/2011   TRIG 239.0* 02/20/2011   HDL 34.10* 02/20/2011   LDLDIRECT 83.8 02/20/2011   ALKPHOS 77 04/11/2012   ALT 19 04/11/2012   AST 20 04/11/2012   NA 140 04/11/2012   K 4.6 04/11/2012   CL 104 04/11/2012   CREATININE 0.94 04/11/2012   BUN 10 04/11/2012   CO2 29 04/11/2012   INR 0.94 11/06/2010    ASSESSMENT AND PLAN:    1.  History of DLBCL:  In remission.  I personally reviewed the CT neck/chest/abd/pel report with patient and his wife.  As he is about 2 years out form the finish of chemo, I recommended the next scan for 1 year from now.  I arranged follow up appointment in about 6 months for f/u with mid level.   2.  Small lung nodule:  No longer present on the current CT scan.   3.  Hyperlipidemia: He is on Welchol.   4.  Hypothyroidism: He is on levothyroxine.   5. Depression: Well controlled on citalopram\.   6. Age-appropriate cancer screening:   He said that he is due for another colonoscopy in about 2013 or 2014. The last one was negative per his report.     The length of time of the face-to-face encounter was 15   minutes. More than 50% of time was spent counseling and coordination of care.

## 2013-01-19 ENCOUNTER — Encounter: Payer: Self-pay | Admitting: Internal Medicine

## 2013-01-19 ENCOUNTER — Ambulatory Visit (INDEPENDENT_AMBULATORY_CARE_PROVIDER_SITE_OTHER): Payer: Medicare Other | Admitting: Internal Medicine

## 2013-01-19 VITALS — BP 148/78 | HR 88 | Temp 98.3°F | Resp 10 | Wt 262.0 lb

## 2013-01-19 DIAGNOSIS — I6529 Occlusion and stenosis of unspecified carotid artery: Secondary | ICD-10-CM

## 2013-01-19 DIAGNOSIS — I10 Essential (primary) hypertension: Secondary | ICD-10-CM

## 2013-01-19 DIAGNOSIS — J069 Acute upper respiratory infection, unspecified: Secondary | ICD-10-CM

## 2013-01-19 DIAGNOSIS — C8581 Other specified types of non-Hodgkin lymphoma, lymph nodes of head, face, and neck: Secondary | ICD-10-CM | POA: Diagnosis not present

## 2013-01-19 MED ORDER — SULFAMETHOXAZOLE-TRIMETHOPRIM 800-160 MG PO TABS: 1.0000 | ORAL_TABLET | Freq: Two times a day (BID) | ORAL | Status: DC

## 2013-01-19 NOTE — Progress Notes (Signed)
Subjective:    Patient ID: Michael Singleton, male    DOB: August 14, 1956, 57 y.o.   MRN: [id number]  HPI Started with cough, sore throat, burning in the chest. Does have a thick yellow mucus, feels very short of breath especially with deep inspiration.  Reviewed Chart - doing very well with lymphoma - last vist to Dr. Gaylyn Rong in October - in remission with clear CT body survey.  Last carotid doppler -Feb '13 - 60-79% obstruction. Due for follow up.  Past Medical History  Diagnosis Date  . 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 Inguinal hernia, left   . Lumbar disc disease   . Occlusion and stenosis of carotid artery without mention of cerebral infarction   . Allergic rhinitis, cause unspecified   . Lumbago   . Pain in joint, upper arm   . Pain in joint, lower leg   . Pain in joint, multiple sites   . Male infertility, unspecified   . Posttraumatic stress disorder   . Diverticulosis of colon (without mention of hemorrhage)   . Alcoholism in family   . Unspecified hypothyroidism   . Old myocardial infarction   . Obstructive sleep apnea (adult) (pediatric)   . Unspecified essential hypertension   . Hyperlipemia   . Coronary atherosclerosis of unspecified type of vessel, native or graft    Past Surgical History  Procedure Date  . Rotator cuff repair   . Ptcb stent   . Inguinal hernia repair   . I&d right neck for lympadenitis     Aug '11  . Radical neck dissection right     Oct '11 (Dr Lazarus Salines)   Family History  Problem Relation Age of Onset  . Cancer Mother   . Heart disease Mother   . Alcohol abuse Other    History   Social History  . Marital Status: Single    Spouse Name: N/A    Number of Children: N/A  . Years of Education: N/A   Occupational History  . Not on file.   Social History Main Topics  . Smoking status: Former Smoker -- 1.5 packs/day    Quit date: 04/11/2004  . Smokeless tobacco: Not on file  . Alcohol Use:   . Drug Use:     . Sexually Active:    Other Topics Concern  . 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  Lives with SO. They  lost her son in a motorcycle accident by a drunk driver.  Close to his 110 year old granddaughter.Marriage is in good health.    Current Outpatient Prescriptions on File Prior to Visit  Medication Sig Dispense Refill  . buPROPion (WELLBUTRIN XL) 300 MG 24 hr tablet Take 300 mg by mouth daily.      . citalopram (CELEXA) 20 MG tablet TAKE 1 TABLET DAILY  90 tablet  1  . colesevelam (WELCHOL) 625 MG tablet Take 1,875 mg by mouth. Take 3 tablets once daily      . levothyroxine (SYNTHROID) 100 MCG tablet Take 100 mcg by mouth daily.        . traZODone (DESYREL) 50 MG tablet TAKE 1 TO 2 TABLETS BY MOUTH AT BEDTIME  180 tablet  5      Review of Systems System review is negative for any constitutional, cardiac, pulmonary, GI or neuro symptoms or complaints other than as described in the HPI.  Objective:   Physical Exam Filed Vitals:   01/19/13 1646  BP: 148/78  Pulse: 88  Temp: 98.3 F (36.8 C)  Resp: 10   Wt Readings from Last 3 Encounters:  01/19/13 262 lb (118.842 kg)  10/23/12 256 lb 1.6 oz (116.166 kg)  04/11/12 271 lb 3.2 oz (123.016 kg)   gen'l- WNWD white man in no distress HEENT - TMs normal, no sinus tenderness, throat clear Neck - post surgical changes right neck. Nodes - none Cor- RRR Pulm - normal respirations, no rales or wheezes        Assessment & Plan:  URI  Plan Septra DS twice a day x 7 days  Robtussin DM 1 tsp every 4 hours  Hydrate  Tylenol for fever, aches

## 2013-01-19 NOTE — Patient Instructions (Addendum)
URI  Plan Septra DS twice a day x 7 days  Robtussin DM 1 tsp every 4 hours  Hydrate  Tylenol for fever, aches  Upper Respiratory Infection, Adult An upper respiratory infection (URI) is also sometimes known as the common cold. The upper respiratory tract includes the nose, sinuses, throat, trachea, and bronchi. Bronchi are the airways leading to the lungs. Most people improve within 1 week, but symptoms can last up to 2 weeks. A residual cough may last even longer.   CAUSES Many different viruses can infect the tissues lining the upper respiratory tract. The tissues become irritated and inflamed and often become very moist. Mucus production is also common. A cold is contagious. You can easily spread the virus to others by oral contact. This includes kissing, sharing a glass, coughing, or sneezing. Touching your mouth or nose and then touching a surface, which is then touched by another person, can also spread the virus. SYMPTOMS   Symptoms typically develop 1 to 3 days after you come in contact with a cold virus. Symptoms vary from person to person. They may include:  Runny nose.   Sneezing.   Nasal congestion.   Sinus irritation.   Sore throat.   Loss of voice (laryngitis).   Cough.   Fatigue.   Muscle aches.   Loss of appetite.   Headache.   Low-grade fever.  DIAGNOSIS   You might diagnose your own cold based on familiar symptoms, since most people get a cold 2 to 3 times a year. Your caregiver can confirm this based on your exam. Most importantly, your caregiver can check that your symptoms are not due to another disease such as strep throat, sinusitis, pneumonia, asthma, or epiglottitis. Blood tests, throat tests, and X-rays are not necessary to diagnose a common cold, but they may sometimes be helpful in excluding other more serious diseases. Your caregiver will decide if any further tests are required. RISKS AND COMPLICATIONS   You may be at risk for a more severe case  of the common cold if you smoke cigarettes, have chronic heart disease (such as heart failure) or lung disease (such as asthma), or if you have a weakened immune system. The very young and very old are also at risk for more serious infections. Bacterial sinusitis, middle ear infections, and bacterial pneumonia can complicate the common cold. The common cold can worsen asthma and chronic obstructive pulmonary disease (COPD). Sometimes, these complications can require emergency medical care and may be life-threatening. PREVENTION   The best way to protect against getting a cold is to practice good hygiene. Avoid oral or hand contact with people with cold symptoms. Wash your hands often if contact occurs. There is no clear evidence that vitamin C, vitamin E, echinacea, or exercise reduces the chance of developing a cold. However, it is always recommended to get plenty of rest and practice good nutrition. TREATMENT   Treatment is directed at relieving symptoms. There is no cure. Antibiotics are not effective, because the infection is caused by a virus, not by bacteria. Treatment may include:  Increased fluid intake. Sports drinks offer valuable electrolytes, sugars, and fluids.   Breathing heated mist or steam (vaporizer or shower).   Eating chicken soup or other clear broths, and maintaining good nutrition.   Getting plenty of rest.   Using gargles or lozenges for comfort.   Controlling fevers with ibuprofen or acetaminophen as directed by your caregiver.   Increasing usage of your inhaler if you have asthma.  Zinc gel and zinc lozenges, taken in the first 24 hours of the common cold, can shorten the duration and lessen the severity of symptoms. Pain medicines may help with fever, muscle aches, and throat pain. A variety of non-prescription medicines are available to treat congestion and runny nose. Your caregiver can make recommendations and may suggest nasal or lung inhalers for other symptoms.     HOME CARE INSTRUCTIONS    Only take over-the-counter or prescription medicines for pain, discomfort, or fever as directed by your caregiver.   Use a warm mist humidifier or inhale steam from a shower to increase air moisture. This may keep secretions moist and make it easier to breathe.   Drink enough water and fluids to keep your urine clear or pale yellow.   Rest as needed.   Return to work when your temperature has returned to normal or as your caregiver advises. You may need to stay home longer to avoid infecting others. You can also use a face mask and careful hand washing to prevent spread of the virus.  SEEK MEDICAL CARE IF:    After the first few days, you feel you are getting worse rather than better.   You need your caregiver's advice about medicines to control symptoms.   You develop chills, worsening shortness of breath, or brown or red sputum. These may be signs of pneumonia.   You develop yellow or brown nasal discharge or pain in the face, especially when you bend forward. These may be signs of sinusitis.   You develop a fever, swollen neck glands, pain with swallowing, or white areas in the back of your throat. These may be signs of strep throat.  SEEK IMMEDIATE MEDICAL CARE IF:    You have a fever.   You develop severe or persistent headache, ear pain, sinus pain, or chest pain.   You develop wheezing, a prolonged cough, cough up blood, or have a change in your usual mucus (if you have chronic lung disease).   You develop sore muscles or a stiff neck.  Document Released: 06/05/2001 Document Revised: 03/03/2012 Document Reviewed: 04/13/2011 [facility] Patient Information 2013 [place], [place].

## 2013-01-20 NOTE — Assessment & Plan Note (Signed)
BP Readings from Last 3 Encounters:  01/19/13 148/78  10/23/12 134/75  04/11/12 127/66   Stable BP

## 2013-01-20 NOTE — Assessment & Plan Note (Signed)
Reviewed last notes - he is in remission and doing well. Not on chemotherapy at this time. He is due for follow up in October '14.

## 2013-01-20 NOTE — Assessment & Plan Note (Signed)
Over due for follow up Carotid doppler.  Plan - [facility] notified to schedule carotid doppler's

## 2013-01-26 ENCOUNTER — Other Ambulatory Visit: Payer: Self-pay | Admitting: Internal Medicine

## 2013-01-26 ENCOUNTER — Telehealth: Payer: Self-pay | Admitting: Internal Medicine

## 2013-01-26 NOTE — Telephone Encounter (Signed)
Please read phone note below and advise. 

## 2013-01-26 NOTE — Telephone Encounter (Signed)
Patient is requesting a refill on the antibiotic he was given last week because he is not feeling much better

## 2013-01-26 NOTE — Telephone Encounter (Signed)
Called pt and lvm asking per Dr Debby Bud did his URI not respond to Septra prescribed 1/27? What are his current symptoms? Asked pt to call back with his current symptoms.

## 2013-01-26 NOTE — Telephone Encounter (Signed)
Please advise whether ok to refill rx.

## 2013-01-26 NOTE — Telephone Encounter (Signed)
Pt returned call and stated that he still has congestion and the mucuos he is spitting up is yellow. Better than he was, but not 100%. Please advise.

## 2013-01-26 NOTE — Telephone Encounter (Signed)
Please call patient - did his URI not respond to Septra prescribed 1/27? What are his current symptoms.  thanks

## 2013-01-27 ENCOUNTER — Other Ambulatory Visit: Payer: Self-pay | Admitting: *Deleted

## 2013-01-27 NOTE — Telephone Encounter (Signed)
Pt's significant other called for pt requesting refill of rx.

## 2013-01-28 MED ORDER — SULFAMETHOXAZOLE-TRIMETHOPRIM 800-160 MG PO TABS: 1.0000 | ORAL_TABLET | Freq: Two times a day (BID) | ORAL | Status: DC

## 2013-01-28 NOTE — Telephone Encounter (Signed)
Ok to extend septra DS another 5 days, #10

## 2013-03-10 ENCOUNTER — Encounter: Payer: Self-pay | Admitting: Internal Medicine

## 2013-03-10 ENCOUNTER — Ambulatory Visit (INDEPENDENT_AMBULATORY_CARE_PROVIDER_SITE_OTHER): Payer: Medicare Other | Admitting: Internal Medicine

## 2013-03-10 VITALS — BP 170/82 | HR 97 | Temp 97.6°F | Resp 16 | Ht 72.0 in | Wt 266.0 lb

## 2013-03-10 DIAGNOSIS — I10 Essential (primary) hypertension: Secondary | ICD-10-CM

## 2013-03-10 DIAGNOSIS — M7551 Bursitis of right shoulder: Secondary | ICD-10-CM

## 2013-03-10 DIAGNOSIS — E785 Hyperlipidemia, unspecified: Secondary | ICD-10-CM | POA: Diagnosis not present

## 2013-03-10 DIAGNOSIS — M719 Bursopathy, unspecified: Secondary | ICD-10-CM | POA: Diagnosis not present

## 2013-03-10 DIAGNOSIS — M67919 Unspecified disorder of synovium and tendon, unspecified shoulder: Secondary | ICD-10-CM | POA: Diagnosis not present

## 2013-03-10 MED ORDER — METHYLPREDNISOLONE ACETATE 40 MG/ML INJ SUSP (RADIOLOG: 120.0000 mg | Freq: Once | INTRAMUSCULAR | Status: DC

## 2013-03-10 MED ORDER — LOSARTAN POTASSIUM-HCTZ 50-12.5 MG PO TABS: 1.0000 | ORAL_TABLET | Freq: Every day | ORAL | Status: DC

## 2013-03-10 NOTE — Assessment & Plan Note (Signed)
Currently not on any medication. He has known CAD and PVD/Carotid disease..  Plan  he is instructed to continue to work with his [place] doctor on appropriate medication.

## 2013-03-10 NOTE — Patient Instructions (Addendum)
1. Tendonitis right shoulder - today we gave you a combination of xylocain and depomedrol. The xylocain will wear off in 1-2 hours, the steroid takes effect in 8-12 hours and lasts 5-7 days. 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  2. Cholesterol management - talk with your [facility] about working with different statin drugs  3. Blood pressure - does seem to be running to high. Plan Start losartan/Hct 50/12.5 a combination of a ARB and HCT a diuretic. This is well tolerated and effective.   Blood pressure check after being on medication for a week or more  Follow up lab to check kidney function and potassium

## 2013-03-10 NOTE — Assessment & Plan Note (Signed)
BP Readings from Last 3 Encounters:  03/10/13 170/82  01/19/13 148/78  10/23/12 134/75   Patient with rising BP and complaining of splitting headaches and nosebleeds  Plan  Start losartan/hct 50/12.5 daily  Check BP at home and report back  Lab - [facility] in 1 month

## 2013-03-10 NOTE — Progress Notes (Signed)
Subjective:    Patient ID: Michael Singleton, male    DOB: [birth year], 57 y.o.   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 He has no paresthesia or muscle weakness. This has happened before.  He has not had any anti-lipemic medication for 6 months. He had good results with Vytorin but the [facility] doesn't [facility] this. He was tried on simvastatin but he did not tolerate this.  Per his wife he has stopped taking celexa and wellbutrin. She reports he does have irritability.  Past Medical History  Diagnosis Date  . 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 Inguinal hernia, left   . Lumbar disc disease   . Occlusion and stenosis of carotid artery without mention of cerebral infarction   . Allergic rhinitis, cause unspecified   . Lumbago   . Pain in joint, upper arm   . Pain in joint, lower leg   . Pain in joint, multiple sites   . Male infertility, unspecified   . Posttraumatic stress disorder   . Diverticulosis of colon (without mention of hemorrhage)   . Alcoholism in family   . Unspecified hypothyroidism   . Old myocardial infarction   . Obstructive sleep apnea (adult) (pediatric)   . Unspecified essential hypertension   . Hyperlipemia   . Coronary atherosclerosis of unspecified type of vessel, native or graft    Past Surgical History  Procedure Laterality Date  . Rotator cuff repair    . Ptcb stent    . Inguinal hernia repair    . I&d right neck for lympadenitis      Aug '11  . Radical neck dissection right      Oct '11 (Dr Lazarus Salines)   Family History  Problem Relation Age of Onset  . Cancer Mother   . Heart disease Mother   . Alcohol abuse Other    History   Social History  . Marital Status: Single    Spouse Name: N/A    Number of Children: N/A  . Years of Education: N/A   Occupational History  . Not on file.   Social History Main Topics  . Smoking status: Former Smoker  -- 1.50 packs/day    Quit date: 04/11/2004  . Smokeless tobacco: Not on file  . Alcohol Use:   . Drug Use:   . Sexually Active:    Other Topics Concern  . 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  Lives with SO. They  lost her son in a motorcycle accident by a drunk driver.  Close to his 67 year old granddaughter.Marriage is in good health.          Current Outpatient Prescriptions on File Prior to Visit  Medication Sig Dispense Refill  . levothyroxine (SYNTHROID) 100 MCG tablet Take 100 mcg by mouth daily.        . traZODone (DESYREL) 50 MG tablet TAKE 1 TO 2 TABLETS BY MOUTH AT BEDTIME  180 tablet  5   No current facility-administered medications on file prior to visit.      Review of Systems System review is negative for any constitutional, cardiac, pulmonary, GI or neuro symptoms or complaints other than as described in the HPI.     Objective:   Physical Exam Filed Vitals:   03/10/13 1332  BP: 170/82  Pulse: 97  Temp: 97.6 F (36.4 C)  Resp: 16   Wt Readings from Last 3 Encounters:  03/10/13 266 lb (120.657 kg)  01/19/13 262 lb (118.842 kg)  10/23/12 256 lb 1.6 oz (116.166 kg)   Gen'l- overweight white man in no acute distress HEENT - C&S clear, PERRLA Neck - s/p radical neck dissection right Cor 2+ radial pulse, RRR Pulm - normal respirations MSK - right shoulder with normal passive ROM, no click or crepitus. Very limited active ROM due to pain.  Procedure bursal injection - right shoulder  Indication - localized pain Consent - informed verbal consent from patient after explanation of risks of bleeding and infection Prep - injection site identified, prepped with betadine followed by alcohol. Med -  40  Mg depomedrol with 0.5 Cc 2% xylocain Injection - bursa/joint space entered easily. Injected without difficulty. Patient tolerated this well. Post-procedure - patient with rapid reduction in discomfort. Bandaid  applied. Routine precautions provided including instruction to return for fever, drainage or increased pain        Assessment & Plan:  Bursitis, right shoulder - patient with overuse bursitis.  Plan Good relief with steroid injection  No painting for 7-10 days.

## 2013-04-03 DIAGNOSIS — J029 Acute pharyngitis, unspecified: Secondary | ICD-10-CM | POA: Diagnosis not present

## 2013-04-03 DIAGNOSIS — J019 Acute sinusitis, unspecified: Secondary | ICD-10-CM | POA: Diagnosis not present

## 2013-04-14 ENCOUNTER — Telehealth: Payer: Self-pay | Admitting: Internal Medicine

## 2013-04-14 NOTE — Telephone Encounter (Signed)
Done hardcopy to robin  

## 2013-04-14 NOTE — Telephone Encounter (Signed)
The wife stated Dr. Alvera Novel writes out a prescription for the order and they take it to the [place] hospital to be done.  Then results are then sent to Dr. Debby Bud.  She stated they cannot afford the cost of a carotid study at [facility].  Please advise

## 2013-04-14 NOTE — Telephone Encounter (Signed)
Request order for Carotid Artery, will have done in [facility] at the [facility]. Will need by Wednesday

## 2013-04-14 NOTE — Telephone Encounter (Signed)
I do not [facility] pri[facility]es for the [facility]  Pt should seek order from [facility] with his care at the [facility]

## 2013-04-15 NOTE — Telephone Encounter (Signed)
Pt notified he can come pick up the order

## 2013-04-20 ENCOUNTER — Ambulatory Visit: Payer: Medicare Other | Admitting: Oncology

## 2013-04-20 ENCOUNTER — Other Ambulatory Visit: Payer: Medicare Other | Admitting: Lab

## 2013-04-22 ENCOUNTER — Other Ambulatory Visit: Payer: Medicare Other | Admitting: Lab

## 2013-04-22 ENCOUNTER — Ambulatory Visit: Payer: Medicare Other | Admitting: Oncology

## 2013-08-04 ENCOUNTER — Emergency Department (HOSPITAL_COMMUNITY): Payer: Non-veteran care

## 2013-08-04 ENCOUNTER — Inpatient Hospital Stay (HOSPITAL_COMMUNITY)
Admission: EM | Admit: 2013-08-04 | Discharge: 2013-08-06 | DRG: 247 | Disposition: A | Discharge status: Home / Self Care | Payer: Non-veteran care | Attending: Internal Medicine | Admitting: Internal Medicine

## 2013-08-04 ENCOUNTER — Encounter (HOSPITAL_COMMUNITY): Payer: Self-pay | Admitting: Student

## 2013-08-04 DIAGNOSIS — I200 Unstable angina: Secondary | ICD-10-CM | POA: Diagnosis not present

## 2013-08-04 DIAGNOSIS — F4310 Post-traumatic stress disorder, unspecified: Secondary | ICD-10-CM | POA: Diagnosis present

## 2013-08-04 DIAGNOSIS — I10 Essential (primary) hypertension: Secondary | ICD-10-CM | POA: Diagnosis present

## 2013-08-04 DIAGNOSIS — Z8249 Family history of ischemic heart disease and other diseases of the circulatory system: Secondary | ICD-10-CM

## 2013-08-04 DIAGNOSIS — I252 Old myocardial infarction: Secondary | ICD-10-CM

## 2013-08-04 DIAGNOSIS — G4733 Obstructive sleep apnea (adult) (pediatric): Secondary | ICD-10-CM | POA: Diagnosis present

## 2013-08-04 DIAGNOSIS — Z79899 Other long term (current) drug therapy: Secondary | ICD-10-CM

## 2013-08-04 DIAGNOSIS — Z91013 Allergy to seafood: Secondary | ICD-10-CM

## 2013-08-04 DIAGNOSIS — Z7902 Long term (current) use of antithrombotics/antiplatelets: Secondary | ICD-10-CM

## 2013-08-04 DIAGNOSIS — Z87891 Personal history of nicotine dependence: Secondary | ICD-10-CM

## 2013-08-04 DIAGNOSIS — Z7982 Long term (current) use of aspirin: Secondary | ICD-10-CM

## 2013-08-04 DIAGNOSIS — Z6372 Alcoholism and drug addiction in family: Secondary | ICD-10-CM

## 2013-08-04 DIAGNOSIS — E785 Hyperlipidemia, unspecified: Secondary | ICD-10-CM | POA: Diagnosis present

## 2013-08-04 DIAGNOSIS — Z955 Presence of coronary angioplasty implant and graft: Secondary | ICD-10-CM

## 2013-08-04 DIAGNOSIS — I498 Other specified cardiac arrhythmias: Secondary | ICD-10-CM | POA: Diagnosis not present

## 2013-08-04 DIAGNOSIS — I2510 Atherosclerotic heart disease of native coronary artery without angina pectoris: Principal | ICD-10-CM | POA: Diagnosis present

## 2013-08-04 DIAGNOSIS — K5730 Diverticulosis of large intestine without perforation or abscess without bleeding: Secondary | ICD-10-CM | POA: Diagnosis present

## 2013-08-04 DIAGNOSIS — E039 Hypothyroidism, unspecified: Secondary | ICD-10-CM | POA: Diagnosis present

## 2013-08-04 DIAGNOSIS — I6529 Occlusion and stenosis of unspecified carotid artery: Secondary | ICD-10-CM | POA: Diagnosis present

## 2013-08-04 HISTORY — DX: Unspecified intracranial injury with loss of consciousness of unspecified duration, initial encounter: S06.9X9A

## 2013-08-04 HISTORY — DX: Low back pain, unspecified: M54.50

## 2013-08-04 HISTORY — DX: Anxiety disorder, unspecified: F41.9

## 2013-08-04 HISTORY — DX: Other chronic pain: G89.29

## 2013-08-04 HISTORY — DX: Depression, unspecified: F32.A

## 2013-08-04 HISTORY — DX: Diffuse large B-cell lymphoma, unspecified site: C83.30

## 2013-08-04 HISTORY — DX: Atherosclerotic heart disease of native coronary artery without angina pectoris: I25.10

## 2013-08-04 HISTORY — DX: Major depressive disorder, single episode, unspecified: F32.9

## 2013-08-04 HISTORY — DX: Unspecified osteoarthritis, unspecified site: M19.90

## 2013-08-04 HISTORY — DX: Cardiac murmur, unspecified: R01.1

## 2013-08-04 HISTORY — DX: Obstructive sleep apnea (adult) (pediatric): G47.33

## 2013-08-04 HISTORY — DX: Low back pain: M54.5

## 2013-08-04 HISTORY — DX: Migraine, unspecified, not intractable, without status migrainosus: G43.909

## 2013-08-04 LAB — BASIC METABOLIC PANEL
CO2: 27 mEq/L (ref 19–32)
Calcium: 9.7 mg/dL (ref 8.4–10.5)
GFR calc non Af Amer: >90 mL/min (ref >90)
Potassium: 4.3 mEq/L (ref 3.5–5.1)
Sodium: 140 mEq/L (ref 135–145)

## 2013-08-04 LAB — PROTIME-INR
INR: 0.97 (ref 0.00–1.49)
Prothrombin Time: 12.7 seconds (ref 11.6–15.2)

## 2013-08-04 LAB — CBC WITH DIFFERENTIAL/PLATELET
Basophils Absolute: 0 10*3/uL (ref 0.0–0.1)
Eosinophils Absolute: 0.1 10*3/uL (ref 0.0–0.7)
Eosinophils Relative: 1 % (ref 0–5)
Lymphocytes Relative: 26 % (ref 12–46)
MCV: 85.7 fL (ref 78.0–100.0)
Neutrophils Relative %: 64 % (ref 43–77)
Platelets: 228 10*3/uL (ref 150–400)
RBC: 4.97 MIL/uL (ref 4.22–5.81)
RDW: 13.3 % (ref 11.5–15.5)
WBC: 7.1 10*3/uL (ref 4.0–10.5)

## 2013-08-04 LAB — POCT I-STAT TROPONIN I: Troponin i, poc: 0 ng/mL (ref 0.00–0.08)

## 2013-08-04 LAB — TROPONIN I
Troponin I: <0.3 ng/mL (ref <0.30)
Troponin I: <0.3 ng/mL (ref <0.30)

## 2013-08-04 MED ORDER — GABAPENTIN 300 MG PO CAPS
300.0000 mg | ORAL_CAPSULE | Freq: Three times a day (TID) | ORAL | Status: DC
  Administered 2013-08-04 – 2013-08-06 (×3): 300 mg via ORAL
  Filled 2013-08-04 (×7): qty 1

## 2013-08-04 MED ORDER — SODIUM CHLORIDE 0.9 % IV SOLN
INTRAVENOUS | Status: DC
  Administered 2013-08-05: 05:00:00 via INTRAVENOUS

## 2013-08-04 MED ORDER — NITROGLYCERIN 2 % TD OINT
1.0000 [in_us] | TOPICAL_OINTMENT | Freq: Once | TRANSDERMAL | Status: AC
  Administered 2013-08-04: 1 [in_us] via TOPICAL
  Filled 2013-08-04: qty 1

## 2013-08-04 MED ORDER — SODIUM CHLORIDE 0.9 % IJ SOLN: 3.0000 mL | INTRAMUSCULAR | Status: DC | PRN

## 2013-08-04 MED ORDER — ACETAMINOPHEN 325 MG PO TABS
650.0000 mg | ORAL_TABLET | ORAL | Status: DC | PRN
  Administered 2013-08-04: 650 mg via ORAL
  Filled 2013-08-04: qty 2

## 2013-08-04 MED ORDER — SODIUM CHLORIDE 0.9 % IV SOLN: 250.0000 mL | INTRAVENOUS | Status: DC | PRN

## 2013-08-04 MED ORDER — TRAZODONE HCL 100 MG PO TABS
200.0000 mg | ORAL_TABLET | Freq: Every day | ORAL | Status: DC
  Administered 2013-08-04 – 2013-08-05 (×2): 200 mg via ORAL
  Filled 2013-08-04 (×3): qty 2

## 2013-08-04 MED ORDER — SODIUM CHLORIDE 0.9 % IJ SOLN
3.0000 mL | Freq: Two times a day (BID) | INTRAMUSCULAR | Status: DC
  Administered 2013-08-04: 3 mL via INTRAVENOUS

## 2013-08-04 MED ORDER — ZOLPIDEM TARTRATE 5 MG PO TABS: 5.0000 mg | ORAL_TABLET | Freq: Every evening | ORAL | Status: DC | PRN

## 2013-08-04 MED ORDER — SODIUM CHLORIDE 0.9 % IJ SOLN
3.0000 mL | Freq: Two times a day (BID) | INTRAMUSCULAR | Status: DC
  Administered 2013-08-04 – 2013-08-05 (×2): 3 mL via INTRAVENOUS

## 2013-08-04 MED ORDER — BUPROPION HCL ER (XL) 300 MG PO TB24
300.0000 mg | ORAL_TABLET | Freq: Every day | ORAL | Status: DC
  Administered 2013-08-06: 10:00:00 300 mg via ORAL
  Filled 2013-08-04 (×3): qty 1

## 2013-08-04 MED ORDER — HEPARIN (PORCINE) IN NACL 100-0.45 UNIT/ML-% IJ SOLN
1750.0000 [IU]/h | INTRAMUSCULAR | Status: DC
  Administered 2013-08-04: 1400 [IU]/h via INTRAVENOUS
  Administered 2013-08-05 (×2): 1750 [IU]/h via INTRAVENOUS
  Filled 2013-08-04 (×4): qty 250

## 2013-08-04 MED ORDER — ONDANSETRON HCL 4 MG/2ML IJ SOLN: 4.0000 mg | Freq: Four times a day (QID) | INTRAMUSCULAR | Status: DC | PRN

## 2013-08-04 MED ORDER — SODIUM CHLORIDE 0.9 % IV SOLN
250.0000 mL | INTRAVENOUS | Status: DC | PRN
  Administered 2013-08-04: 250 mL via INTRAVENOUS

## 2013-08-04 MED ORDER — NITROGLYCERIN 2 % TD OINT
1.0000 [in_us] | TOPICAL_OINTMENT | Freq: Four times a day (QID) | TRANSDERMAL | Status: DC
  Administered 2013-08-05 (×2): 1 [in_us] via TOPICAL
  Filled 2013-08-04: qty 30

## 2013-08-04 MED ORDER — NITROGLYCERIN 0.4 MG SL SUBL: 0.4000 mg | SUBLINGUAL_TABLET | SUBLINGUAL | Status: DC | PRN

## 2013-08-04 MED ORDER — ALPRAZOLAM 0.25 MG PO TABS: 0.2500 mg | ORAL_TABLET | Freq: Two times a day (BID) | ORAL | Status: DC | PRN

## 2013-08-04 MED ORDER — HEPARIN BOLUS VIA INFUSION
4000.0000 [IU] | Freq: Once | INTRAVENOUS | Status: AC
  Administered 2013-08-04: 4000 [IU] via INTRAVENOUS
  Filled 2013-08-04: qty 4000

## 2013-08-04 MED ORDER — LEVOTHYROXINE SODIUM 112 MCG PO TABS
112.0000 ug | ORAL_TABLET | Freq: Every day | ORAL | Status: DC
  Administered 2013-08-05: 112 ug via ORAL
  Filled 2013-08-04 (×2): qty 1

## 2013-08-04 MED ORDER — CITALOPRAM HYDROBROMIDE 40 MG PO TABS
40.0000 mg | ORAL_TABLET | Freq: Every day | ORAL | Status: DC
  Administered 2013-08-06: 40 mg via ORAL
  Filled 2013-08-04 (×3): qty 1

## 2013-08-04 MED ORDER — PRESCRIPTION MEDICATION: 1.0000 "application " | Freq: Every day | Status: DC

## 2013-08-04 NOTE — H&P (Signed)
CARDIOLOGY HISTORY AND PHYSICAL     Patient ID: Michael Singleton  MRN: [id number], DOB/AGE: [birth year]  Admit date: 08/04/2013  Date of Consult: 08/04/2013  Primary Physician: Illene Regulus, MD  [facility]: [facility]   Pt. 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   HPI: Patient reported he has had intermittent chest pain, flutters, and fatigue for the last month. Yesterday and today he felt ill, weakness, extremely diaphoretic, and short of breath which was associated with chest pressure as heavy as an 8/10. It would be relieved with laying down, but come on with activity. Today his wife checked his vitals and patient was afebrile, but his BP was 170/55 with HR 35. She called EMS and with patient in bigeminy, his heart rate was recorded in 30s. Currently his chest pressure is a 2-3/10 but has been constant since 10 am. He called EMS and got NTG en route which helped his chest pressure. He is currently off his medications for hyperlipidemia and hypertension. He has history of sudden cardiac arrests with stent placement 12-15 years ago at [facility] in [place], [place]. He has not been cathed since then. No stress test in > 5 years. No history of chest pain. Stent/MI were associated with cardiac arrest and he does not remember any chest pain. His provider is the [place] in [facility] and they have cut medications for unknown reasons.  Currently, after SL NTG and NTG paste, he is pain-free. His PVCs and bigeminy have resolved.   Problem List  Past Medical History   Diagnosis  Date   .  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  Inguinal hernia, left    .  Lumbar disc disease    .  Occlusion and stenosis of carotid artery without mention of cerebral infarction    .   Allergic rhinitis, cause unspecified    .  Lumbago    .  Pain in joint, upper arm    .  Pain in joint, lower leg    .  Pain in joint, multiple sites    .  Male infertility, unspecified    .  Posttraumatic stress disorder    .  Diverticulosis of colon (without mention of hemorrhage)    .  Alcoholism in family    .  Unspecified hypothyroidism    .  Old myocardial infarction    .  Obstructive sleep apnea (adult) (pediatric)    .  Unspecified essential hypertension    .  Hyperlipemia    .  Coronary atherosclerosis of unspecified type of vessel, native or graft     Past Surgical History   Procedure  Laterality  Date   .  Rotator cuff repair     .  Ptcb stent     .  Inguinal hernia repair     .  I&d right neck for lympadenitis       Aug '11   .  Radical neck dissection right       Oct '11 (Dr Lazarus Salines)    Allergies  Allergies   Allergen  Reactions   .  Fish Allergy  Nausea And Vomiting   .  Shellfish Allergy  Nausea And Vomiting    Inpatient Medications  NTG Paste 1"  Family History  Family History   Problem  Relation  Age of Onset   .  Cancer  Mother    .  Heart disease  Mother    .  Alcohol abuse  Other    .  Heart attack  Father      3 heart attacks    Social History  History    Social History   .  Marital Status:  Married     Spouse Name:  N/A     Number of Children:  N/A   .  Years of Education:  N/A    Occupational History   .  Disabled     Social History Main Topics   .  Smoking status:  Former Smoker -- 40 years     Quit date:  04/11/2004   .  Smokeless tobacco:  Never Used   .  Alcohol Use:  Yes      Comment: Hx abuse, quit 2005   .  Drug Use:  Not on file   .  Sexually Active:  Not on file    Other Topics  Concern   .  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 Lives with wife. They lost her son in a motorcycle accident by a drunk driver. Close to his 84  year old granddaughter.    Review of Systems:  General: No chills, fever, night sweats or weight changes.  Cardiovascular: No chest pain, dyspnea on exertion, edema, orthopnea, palpitations, paroxysmal nocturnal dyspnea.  Dermatological: No rash, lesions/masses  Respiratory: No cough, dyspnea  Urologic: No hematuria, dysuria  Abdominal: No nausea, vomiting, diarrhea, bright red blood per rectum, melena, or hematemesis  Neurologic: No visual changes, wkns, changes in mental status.  All other systems reviewed and are otherwise negative except as noted above.   Physical Exam  Blood pressure 151/76, pulse 68, temperature 97.6 F (36.4 C), temperature source Oral, resp. rate 17, SpO2 99.00%.  General: Pleasant male, NAD  Psych: Normal affect.  Neuro: Alert and oriented X 3. Moves all extremities spontaneously. Good strength  HEENT: Normal except for right side of neck is s/p radical dissection and left pupil 3 mm, right pupil 2 mm  Neck: Supple with bruits. No JVD.  Lungs: Resp regular and unlabored, CTA.  Heart: RRR no s3, s4. 2/6 Murmur but hard to ascertain due to respirations.  Abdomen: Soft, non-tender, non-distended, BS + x 4.  Extremities: No clubbing, cyanosis or edema. DP/PT/Radials 2+ and equal bilaterally.  Labs   Recent Labs   08/04/13 1556   TROPONINI  <0.30    Lab Results   Component  Value  Date    WBC  7.1  08/04/2013    HGB  14.7  08/04/2013    HCT  42.6  08/04/2013    MCV  85.7  08/04/2013    PLT  228  08/04/2013     Recent Labs  Lab  08/04/13 1555   NA  140   K  4.3   CL  100   CO2  27   BUN  10   CREATININE  0.90   CALCIUM  9.7   GLUCOSE  99    Radiology/Studies  Dg Chest 2 View  08/04/2013 *RADIOLOGY REPORT* Clinical Data: Chest pain. Shortness of breath. Dizziness. Diaphoresis. CHEST - 2 VIEW Comparison: 07/23/2010 Findings: The heart  size and mediastinal contours are within normal limits. Both lungs are clear. The visualized skeletal structures are  unremarkable. IMPRESSION: No active cardiopulmonary disease. Original Report Authenticated By: Myles Rosenthal, M.D.   Ct Head Wo Contrast  08/04/2013 *RADIOLOGY REPORT* Clinical Data: Right arm weakness and numbness, history of carotid stenosis and lymphoma CT HEAD WITHOUT CONTRAST Technique: Contiguous axial images were obtained from the base of the skull through the vertex without contrast. Comparison: Head CT - 04/11/2011 Findings: Wallace Cullens white differentiation is maintained. No CT evidence of acute large territory infarct. No intraparenchymal or extra-axial mass or hemorrhage. Normal size and configuration of the ventricles and basilar cisterns. No midline shift. LImited visualization of the paranasal sinuses and mastoid air cells are normal. Regional soft tissues are normal. No displaced calvarial fracture. IMPRESSION: Negative noncontrast head CT. Original Report Authenticated By: Tacey Ruiz, MD   ECHO:  Echo 2011: showed mild LVH. Systolic function was normal. The estimated ejection fraction was 55%.   ECG  Sinus rhythm with Ventricular Bigeminy and retrograde p waves - placed in patient's folder. No acute st-t changes NSR   ASSESSMENT AND PLAN  Chest pain/unstable angina - considering cardiac history, a cardiac cath may be the best option at this point. Would consider a echo imaging study as well. His initial enzymes are negative despite > 6 hrs of pain. Continue to cycle, but no urgent cath needed at this time. Add heparin, continue nitrates, monitor for symptoms.   CAD - h/o previous SCD with remote stenting  Hyperlipidemia - place on statin   Hypertension - metoprolol for HR control and BP.   Cerebrovascular disease - Consider carotid doppler of neck if not done recently.  Ventricular Bigeminy - May have been the precipitating event for his symptoms.  Pending TSH.   Signed,  Theodore Demark, NP  08/04/2013, 5:15 PM  Patient seen and examined with Theodore Demark, PA-C. We discussed  all aspects of the encounter. I agree with the assessment and plan as stated above.   Mr Minnie presents with several day h/o exertional diaphoresis, weakness and chest tightness. His wife notes that his pulse has been in the 30s at home during episodes. Symptoms markedly improved with NTG given by EMS. On arrival, he was in bigeminy with ovearll HR in 70s. Bigeminy now resolved. No further CP or diaphoresis. Troponin normal.  Given his h/o known CAD, I am concerned that his symptoms are likely unstable angina, however may also be due to bigeminy (PVCs may be ischemic in nature as well). I feel he needs a cardiac cath in the am and he and his family agree. Given PVCs will check echo and TSH/T4. Will need repeat carotid imaging at some point given reported 70-80% stenosis.   Darrill Vreeland,MD 6:10 PM

## 2013-08-04 NOTE — ED Provider Notes (Signed)
CSN: [id number]     Arrival date & time 08/04/13  1330 History     First MD Initiated Contact with Patient 08/04/13 1333     Chief Complaint  Patient presents with  . Chest Pain   (Consider location/radiation/quality/duration/timing/severity/associated sxs/prior Treatment) Patient is a 57 y.o. male presenting with chest pain. The history is provided by the patient.  Chest Pain Pain location:  L chest Associated symptoms: diaphoresis, dizziness, fatigue and nausea   Associated symptoms: no abdominal pain, no cough, no fever, no headache, no numbness, no palpitations, no shortness of breath and not vomiting    Michael Singleton is a 57 year old male with an extensive PMH documented below, which is significant for previous MI ~12-15 years ago for which a stent was placed.  He reports that since yesterday day he has had episodes of diaphoresis and nausea with some mild chest tightness that is worsened by exertion and somewhat relieved by rest.  Patient reported symptoms started when he was playing with his grandchildren yesterday.  Patient reports he has never had these symptoms before, and that he does not know what his last MI felt like since he dropped to the floor and woke up at the hospital.  Patient reports that he does not have a current Cardiologist, he does see Dr, Benjaman Kindler at the [facility] and Dr. Debby Bud as his PCP.  He reports that over the past 2 years or so he his heart medications (statin and aspirin) were stopped by Dr. Benjaman Kindler.  Past Medical History  Diagnosis Date  . 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 Inguinal hernia, left   . Lumbar disc disease   . Occlusion and stenosis of carotid artery without mention of cerebral infarction   . Allergic rhinitis, cause unspecified   . Lumbago   . Pain in joint, upper arm   . Pain in joint, lower leg   . Pain in joint, multiple sites   . Male infertility, unspecified   . Posttraumatic stress disorder   .  Diverticulosis of colon (without mention of hemorrhage)   . Alcoholism in family   . Unspecified hypothyroidism   . Old myocardial infarction   . Obstructive sleep apnea (adult) (pediatric)   . Unspecified essential hypertension   . Hyperlipemia   . Coronary atherosclerosis of unspecified type of vessel, native or graft    Past Surgical History  Procedure Laterality Date  . Rotator cuff repair    . Ptcb stent    . Inguinal hernia repair    . I&d right neck for lympadenitis      Aug '11  . Radical neck dissection right      Oct '11 (Dr Lazarus Salines)   Family History  Problem Relation Age of Onset  . Cancer Mother   . Heart disease Mother   . Alcohol abuse Other    History  Substance Use Topics  . Smoking status: Former Smoker -- 1.50 packs/day    Quit date: 04/11/2004  . Smokeless tobacco: Not on file  . Alcohol Use:     Review of Systems  Constitutional: Positive for diaphoresis and fatigue. Negative for fever and chills.  HENT: Negative for neck pain.   Eyes: Negative for visual disturbance.  Respiratory: Positive for chest tightness. Negative for cough and shortness of breath.   Cardiovascular: Negative for chest pain and palpitations.  Gastrointestinal: Positive for nausea. Negative for vomiting and abdominal pain.  Genitourinary: Negative for dysuria.  Musculoskeletal: Positive for arthralgias (r shoulder pain, chronic after radical neck surgery).  Skin: Negative for rash.  Neurological: Positive for dizziness. Negative for syncope, facial asymmetry, speech difficulty, numbness and headaches.  Psychiatric/Behavioral: The patient is not nervous/anxious.     Allergies  Review of patient's allergies indicates no known allergies.  Home Medications   Current Outpatient Rx  Name  Route  Sig  Dispense  Refill  . levothyroxine (SYNTHROID) 100 MCG tablet   Oral   Take 100 mcg by mouth daily.           Marland Kitchen losartan-hydrochlorothiazide (HYZAAR) 50-12.5 MG per tablet    Oral   Take 1 tablet by mouth daily.   90 tablet   3   . traZODone (DESYREL) 50 MG tablet      TAKE 1 TO 2 TABLETS BY MOUTH AT BEDTIME   180 tablet   5    BP 144/60  Pulse 68  Temp(Src) 97.7 F (36.5 C) (Oral)  Resp 18  SpO2 96% Physical Exam  Nursing note and vitals reviewed. Constitutional: He is oriented to person, place, and time. He appears well-developed and well-nourished. No distress.  HENT:  Head: Normocephalic and atraumatic.  Eyes: Conjunctivae are normal.  Neck: Normal range of motion. Neck supple. No JVD present.  Cardiovascular: Normal rate.   Murmur (systolic) heard. Pulmonary/Chest: Effort normal and breath sounds normal. No respiratory distress. He has no wheezes. He has no rales. He exhibits no tenderness.  Abdominal: Soft. Bowel sounds are normal. There is no tenderness.  Musculoskeletal: He exhibits no edema and no tenderness.  Neurological: He is alert and oriented to person, place, and time. He has normal strength. No sensory deficit. Coordination normal.  Left pupil 3 mm, right 2mm, both equally reactive  Skin: Skin is warm. He is not diaphoretic. No erythema.  Psychiatric: He has a normal mood and affect.    ED Course   Procedures (including critical care time)  Date: 08/04/2013  Rate: 88  Rhythm: bigeminy  QRS Axis: normal  Intervals: normal  ST/T Wave abnormalities: nonspecific ST changes  Conduction Disutrbances:none  Narrative Interpretation: Sinus rhythm, normal axis, with ventricular bigeminy noted.  Old EKG Reviewed: changes noted (10/19/2010)    Labs Reviewed  CBC WITH DIFFERENTIAL  BASIC METABOLIC PANEL  TROPONIN I  POCT I-STAT TROPONIN I   Dg Chest 2 View  08/04/2013   *RADIOLOGY REPORT*  Clinical Data: Chest pain.  Shortness of breath.  Dizziness. Diaphoresis.  CHEST - 2 VIEW  Comparison:  07/23/2010  Findings:  The heart size and mediastinal contours are within normal limits.  Both lungs are clear.  The visualized skeletal  structures are unremarkable.  IMPRESSION: No active cardiopulmonary disease.   Original Report Authenticated By: Myles Rosenthal, M.D.   Ct Head Wo Contrast  08/04/2013   *RADIOLOGY REPORT*  Clinical Data: Right arm weakness and numbness, history of carotid stenosis and lymphoma  CT HEAD WITHOUT CONTRAST  Technique:  Contiguous axial images were obtained from the base of the skull through the vertex without contrast.  Comparison: Head CT - 04/11/2011  Findings:  Wallace Cullens white differentiation is maintained.  No CT evidence of acute large territory infarct.  No intraparenchymal or extra-axial mass or hemorrhage.  Normal size and configuration of the ventricles and basilar cisterns.  No midline shift.   LImited visualization of the paranasal sinuses and mastoid air cells are normal.  Regional soft tissues are normal.  No displaced calvarial fracture.  IMPRESSION: Negative noncontrast  head CT.   Original Report Authenticated By: Tacey Ruiz, MD   1. Intermediate coronary syndrome   2. Ventricular bigeminy     MDM  57 year old male with PMH of CAD presents with one day history of diaphoresis, nausea and mild chest thightess associated with exertion and relieved by rest.  Patient's EMS rhythm strip showed bigeminy and ST elevation in leads II, III, and aVF.  However repeat EKG here in ED shows bigeminy without ST elevation.  Patient has been given 325mg  ASA. -Symptoms are concerning for ACS will get CE and repeat EKG. -CBC wnl -BMP wnl -[facility] consulted to evaluate. -Head CT- negative 6:30pm Spoke to [facility]- cardiology they will admit for observation with possible cardiac cath tomorrow morning.  Do not need hospitalist to admit.  Carlynn Purl, DO 08/04/13 2100  Medical screening examination/treatment/procedure(s) were conducted as a shared visit with non-physician practitioner(s) or resident and myself. I personally evaluated the patient during the encounter and agree with the findings  and plan unless otherwise indicated.  Cardiac hx with diaphoresis, nausea and chest tightness for 2 days. Rhythm strip concerning for possible STEMI however formal EKG did not show STEMI. Discussed with cardiology who will evaluate. [facility]. Mild chest tightness. ASA given. 2+ SM LSB. Lungs clear, no distress, mild diaphoresis. No recent surgery/ blood clot hx. No sob. Plan for admission. EKG reviewed. Cardiology admitting primary for further workup. CP improved with nitro in ED.  Enid Skeens, MD 08/04/13 [phone]

## 2013-08-04 NOTE — Progress Notes (Signed)
ANTICOAGULATION CONSULT NOTE - Initial Consult  Pharmacy Consult for Heparin Indication: chest pain/ACS  Allergies  Allergen Reactions  . Fish Allergy Nausea And Vomiting  . Shellfish Allergy Nausea And Vomiting    Patient Measurements: Height: 6' (182.9 cm) Weight: 268 lb 8.3 oz (121.8 kg) IBW/kg (Calculated) : 77.6 Heparin Dosing Weight: 104.4kg  Vital Signs: Temp: 97.3 F (36.3 C) (08/12 2029) Temp src: Oral (08/12 2029) BP: 181/69 mmHg (08/12 2029) Pulse Rate: 84 (08/12 2029)  Labs:  Recent Labs  08/04/13 1555 08/04/13 1556  HGB 14.7  --   HCT 42.6  --   PLT 228  --   CREATININE 0.90  --   TROPONINI  --  <0.30    Estimated Creatinine Clearance: 122.1 ml/min (by C-G formula based on Cr of 0.9).   Medical History: Past Medical History  Diagnosis Date  . 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 Inguinal hernia, left   . Lumbar disc disease   . Occlusion and stenosis of carotid artery without mention of cerebral infarction   . Allergic rhinitis, cause unspecified   . Lumbago   . Pain in joint, upper arm   . Pain in joint, lower leg   . Pain in joint, multiple sites   . Male infertility, unspecified   . Posttraumatic stress disorder   . Diverticulosis of colon (without mention of hemorrhage)   . Alcoholism in family   . Unspecified hypothyroidism   . Old myocardial infarction   . Obstructive sleep apnea (adult) (pediatric)   . Unspecified essential hypertension   . Hyperlipemia   . Coronary atherosclerosis of unspecified type of vessel, native or graft     Medications:  Facility-administered medications prior to admission  Medication Dose Route Frequency Provider Last Rate Last Dose  . methylPREDNISolone acetate (DEPO-MEDROL) injection (RADIOLOGY ONLY) 120 mg  120 mg Intra-articular Once Jacques Navy, MD       Prescriptions prior to admission  Medication Sig Dispense Refill  . buPROPion (WELLBUTRIN XL) 300 MG 24 hr  tablet Take 300 mg by mouth daily.      . citalopram (CELEXA) 40 MG tablet Take 40 mg by mouth daily.      Marland Kitchen gabapentin (NEURONTIN) 300 MG capsule Take 300 mg by mouth 3 (three) times daily.      Marland Kitchen levothyroxine (SYNTHROID, LEVOTHROID) 112 MCG tablet Take 112 mcg by mouth daily before breakfast.      . PRESCRIPTION MEDICATION Apply 1 application topically at bedtime. Cream from [place] for dermatitis      . traZODone (DESYREL) 100 MG tablet Take 200 mg by mouth at bedtime.        Assessment: 56yom admitted with CP to start heparin for ACS. Patient reports no significant bleeding and not on any anticoagulants. Noted plans for cath tomorrow AM. - Baseline INR ordered - H/H and Plts wnl - Heparin weight: 104.4 kg  Goal of Therapy:  Heparin level 0.3-0.7 units/ml Monitor platelets by anticoagulation protocol: Yes   Plan:  1. Heparin IV bolus 4000 units x 1 2. Heparin drip 1400 units/hr (14 units/hr) 3. Check heparin level 6 hours after initiation 4. Daily heparin level and CBC 5. INR and PTT now  Cleon Dew [phone] 08/04/2013,8:49 PM

## 2013-08-04 NOTE — Progress Notes (Addendum)
Wrong note ................................................................................................... 

## 2013-08-04 NOTE — ED Notes (Addendum)
Per ems pt reports burning epigastric pain in upper chest that started yesterday.  Pt had diarrhea yesterday but none today. Pt hx of mi 1995. Recently was taken off a lot of meds by [place].  Pt alert oriented X4 108/72 54 irreg 100 2 liters. 20g left AC.324 asp 1 nitro dropped pressure from 180/90 102/70.  Burning free after meds.

## 2013-08-05 ENCOUNTER — Encounter (HOSPITAL_COMMUNITY)
Admission: EM | Disposition: A | Discharge status: Home / Self Care | Payer: Self-pay | Source: Home / Self Care | Attending: Internal Medicine

## 2013-08-05 DIAGNOSIS — I2510 Atherosclerotic heart disease of native coronary artery without angina pectoris: Secondary | ICD-10-CM

## 2013-08-05 DIAGNOSIS — I200 Unstable angina: Secondary | ICD-10-CM

## 2013-08-05 DIAGNOSIS — I517 Cardiomegaly: Secondary | ICD-10-CM | POA: Diagnosis not present

## 2013-08-05 HISTORY — PX: LEFT HEART CATHETERIZATION WITH CORONARY ANGIOGRAM: SHX5451

## 2013-08-05 LAB — LIPID PANEL
HDL: 31 mg/dL — ABNORMAL LOW (ref >39)
LDL Cholesterol: 188 mg/dL — ABNORMAL HIGH (ref 0–99)

## 2013-08-05 LAB — CBC
HCT: 39.7 % (ref 39.0–52.0)
MCHC: 35 g/dL (ref 30.0–36.0)
MCV: 85.7 fL (ref 78.0–100.0)
Platelets: 207 10*3/uL (ref 150–400)
RDW: 13.5 % (ref 11.5–15.5)
WBC: 7.1 10*3/uL (ref 4.0–10.5)

## 2013-08-05 LAB — TSH: TSH: 33.692 u[IU]/mL — ABNORMAL HIGH (ref 0.350–4.500)

## 2013-08-05 LAB — TROPONIN I
Troponin I: <0.3 ng/mL (ref <0.30)
Troponin I: <0.3 ng/mL (ref <0.30)

## 2013-08-05 LAB — COMPREHENSIVE METABOLIC PANEL
AST: 21 U/L (ref 0–37)
Albumin: 3.9 g/dL (ref 3.5–5.2)
BUN: 11 mg/dL (ref 6–23)
Chloride: 103 mEq/L (ref 96–112)
Creatinine, Ser: 0.93 mg/dL (ref 0.50–1.35)
Total Bilirubin: 0.4 mg/dL (ref 0.3–1.2)
Total Protein: 6.7 g/dL (ref 6.0–8.3)

## 2013-08-05 LAB — HEPARIN LEVEL (UNFRACTIONATED): Heparin Unfractionated: 0.2 IU/mL — ABNORMAL LOW (ref 0.30–0.70)

## 2013-08-05 LAB — MAGNESIUM: Magnesium: 2.3 mg/dL (ref 1.5–2.5)

## 2013-08-05 LAB — T4, FREE: Free T4: 0.77 ng/dL — ABNORMAL LOW (ref 0.80–1.80)

## 2013-08-05 SURGERY — LEFT HEART CATHETERIZATION WITH CORONARY ANGIOGRAM
Anesthesia: LOCAL

## 2013-08-05 MED ORDER — NITROGLYCERIN 0.2 MG/ML ON CALL CATH LAB
INTRAVENOUS | Status: AC
  Filled 2013-08-05: qty 1

## 2013-08-05 MED ORDER — ASPIRIN 81 MG PO CHEW
324.0000 mg | CHEWABLE_TABLET | ORAL | Status: AC
  Administered 2013-08-05: 324 mg via ORAL
  Filled 2013-08-05: qty 4

## 2013-08-05 MED ORDER — FENTANYL CITRATE 0.05 MG/ML IJ SOLN
INTRAMUSCULAR | Status: AC
  Filled 2013-08-05: qty 2

## 2013-08-05 MED ORDER — ASPIRIN 81 MG PO CHEW
81.0000 mg | CHEWABLE_TABLET | Freq: Every day | ORAL | Status: DC
  Administered 2013-08-06: 81 mg via ORAL
  Filled 2013-08-05: qty 1

## 2013-08-05 MED ORDER — MIDAZOLAM HCL 2 MG/2ML IJ SOLN
INTRAMUSCULAR | Status: AC
  Filled 2013-08-05: qty 2

## 2013-08-05 MED ORDER — BIVALIRUDIN 250 MG IV SOLR
INTRAVENOUS | Status: AC
  Filled 2013-08-05: qty 250

## 2013-08-05 MED ORDER — LIDOCAINE HCL (PF) 1 % IJ SOLN
INTRAMUSCULAR | Status: AC
  Filled 2013-08-05: qty 30

## 2013-08-05 MED ORDER — CLOPIDOGREL BISULFATE 75 MG PO TABS
75.0000 mg | ORAL_TABLET | Freq: Every day | ORAL | Status: DC
  Filled 2013-08-05: qty 1

## 2013-08-05 MED ORDER — DIAZEPAM 5 MG PO TABS
5.0000 mg | ORAL_TABLET | ORAL | Status: AC
  Administered 2013-08-05: 5 mg via ORAL
  Filled 2013-08-05: qty 1

## 2013-08-05 MED ORDER — CLOPIDOGREL BISULFATE 300 MG PO TABS
ORAL_TABLET | ORAL | Status: AC
  Administered 2013-08-06: 10:00:00 75 mg via ORAL
  Filled 2013-08-05: qty 2

## 2013-08-05 MED ORDER — HEPARIN SODIUM (PORCINE) 1000 UNIT/ML IJ SOLN
INTRAMUSCULAR | Status: AC
  Filled 2013-08-05: qty 1

## 2013-08-05 MED ORDER — ASPIRIN 81 MG PO CHEW: 324.0000 mg | CHEWABLE_TABLET | ORAL | Status: DC

## 2013-08-05 MED ORDER — VERAPAMIL HCL 2.5 MG/ML IV SOLN
INTRAVENOUS | Status: AC
  Filled 2013-08-05: qty 2

## 2013-08-05 MED ORDER — HEPARIN (PORCINE) IN NACL 2-0.9 UNIT/ML-% IJ SOLN
INTRAMUSCULAR | Status: AC
  Filled 2013-08-05: qty 1000

## 2013-08-05 MED ORDER — HEPARIN BOLUS VIA INFUSION
2000.0000 [IU] | Freq: Once | INTRAVENOUS | Status: AC
  Administered 2013-08-05: 2000 [IU] via INTRAVENOUS
  Filled 2013-08-05: qty 2000

## 2013-08-05 MED ORDER — SODIUM CHLORIDE 0.9 % IV SOLN
INTRAVENOUS | Status: AC
  Administered 2013-08-05: 20:00:00 via INTRAVENOUS

## 2013-08-05 MED ORDER — LEVOTHYROXINE SODIUM 150 MCG PO TABS
150.0000 ug | ORAL_TABLET | Freq: Every day | ORAL | Status: DC
  Administered 2013-08-06: 150 ug via ORAL
  Filled 2013-08-05 (×2): qty 1

## 2013-08-05 NOTE — Progress Notes (Addendum)
Patient Name: Michael Singleton Date of Encounter: 08/05/2013  Active Problems:   HYPOTHYROIDISM   HYPERLIPIDEMIA   Intermediate coronary syndrome   Ventricular bigeminy    SUBJECTIVE: Patient is doing well with no complaints. Headache and back pain are still present. He denies chest pain, palpitations, shortness of breath, vision changes, weakness, and fever/chills/diaphoresis.   OBJECTIVE Filed Vitals:   08/04/13 2029 08/05/13 0425 08/05/13 0500 08/05/13 0533  BP: 181/69   134/73  Pulse: 84   66  Temp: 97.3 F (36.3 C)   97.5 F (36.4 C)  TempSrc: Oral   Oral  Resp: 20   20  Height: 6' (1.829 m)     Weight: 268 lb 8.3 oz (121.8 kg) 266 lb 1.5 oz (120.7 kg) 266 lb 1.5 oz (120.7 kg) 266 lb 1.5 oz (120.7 kg)  SpO2: 97%   99%    Intake/Output Summary (Last 24 hours) at 08/05/13 0904 Last data filed at 08/05/13 0700  Gross per 24 hour  Intake 1051.33 ml  Output   1175 ml  Net -123.67 ml   Filed Weights   08/05/13 0425 08/05/13 0500 08/05/13 0533  Weight: 266 lb 1.5 oz (120.7 kg) 266 lb 1.5 oz (120.7 kg) 266 lb 1.5 oz (120.7 kg)    PHYSICAL EXAM General: Well developed, well nourished, male in no acute distress. Head: Normocephalic, atraumatic.  Neck: Supple without bruits, no JVD. Lungs:  Resp regular and unlabored, CTA. Heart: RRR, S1, S2, no S3, S4. Has a 2/6 mid-systolic murmur heard best at the apex and along LSB. Abdomen: Soft, non-tender, non-distended, BS + x 4.  Extremities: No clubbing, cyanosis, no pitting edema.  Neuro: Alert and oriented X 3. Moves all extremities spontaneously. Psych: Normal affect.  LABS: CBC:  Recent Labs  08/04/13 1555 08/05/13 0300  WBC 7.1 7.1  NEUTROABS 4.6  --   HGB 14.7 13.9  HCT 42.6 39.7  MCV 85.7 85.7  PLT 228 207   INR:  Recent Labs  08/04/13 2118  INR 0.97   Basic Metabolic Panel:  Recent Labs  91/47/82 1555 08/05/13 0300  NA 140 138  K 4.3 4.1  CL 100 103  CO2 27 25  GLUCOSE 99 100*  BUN 10 11    CREATININE 0.90 0.93  CALCIUM 9.7 9.0   Magnesium is pending  Liver Function Tests:  Recent Labs  08/05/13 0300  AST 21  ALT 16  ALKPHOS 74  BILITOT 0.4  PROT 6.7  ALBUMIN 3.9   Cardiac Enzymes:  Recent Labs  08/04/13 1556 08/04/13 2051 08/05/13 0300  TROPONINI <0.30 <0.30 <0.30    Recent Labs  08/04/13 1653  TROPIPOC 0.00   Fasting Lipid Panel:  Recent Labs  08/05/13 0300  CHOL 274*  HDL 31*  LDLCALC 188*  TRIG 277*  CHOLHDL 8.8   Thyroid Function Tests: Lab Results  Component Value Date   TSH 33.692* 08/04/2013   FREE T4 0.77* 08/04/2013    TELE:    NSR, sinus bradycardia at times overnight, into the upper 40s  ECG:  08/05/13 - EKG with no new abnormalities  Radiology/Studies: Dg Chest 2 View  08/04/2013 *RADIOLOGY REPORT* Clinical Data: Chest pain. Shortness of breath. Dizziness. Diaphoresis. CHEST - 2 VIEW Comparison: 07/23/2010 Findings: The heart size and mediastinal contours are within normal limits. Both lungs are clear. The visualized skeletal structures are unremarkable. IMPRESSION: No active cardiopulmonary disease. Original Report Authenticated By: Myles Rosenthal, M.D.   Ct Head Wo Contrast  08/04/2013 *RADIOLOGY  REPORT* Clinical Data: Right arm weakness and numbness, history of carotid stenosis and lymphoma CT HEAD WITHOUT CONTRAST Technique: Contiguous axial images were obtained from the base of the skull through the vertex without contrast. Comparison: Head CT - 04/11/2011 Findings: Wallace Cullens white differentiation is maintained. No CT evidence of acute large territory infarct. No intraparenchymal or extra-axial mass or hemorrhage. Normal size and configuration of the ventricles and basilar cisterns. No midline shift. LImited visualization of the paranasal sinuses and mastoid air cells are normal. Regional soft tissues are normal. No displaced calvarial fracture. IMPRESSION: Negative noncontrast head CT. Original Report Authenticated By: Tacey Ruiz, MD      Current Medications:  . buPROPion  300 mg Oral Daily  . citalopram  40 mg Oral Daily  . diazepam  5 mg Oral On Call  . gabapentin  300 mg Oral TID  . levothyroxine  112 mcg Oral QAC breakfast  . nitroGLYCERIN  1 inch Topical Q6H  . PRESCRIPTION MEDICATION 1 application  1 application Topical QHS  . sodium chloride  3 mL Intravenous Q12H  . sodium chloride  3 mL Intravenous Q12H  . traZODone  200 mg Oral QHS   . sodium chloride 75 mL/hr at 08/05/13 0518  . heparin 1,750 Units/hr (08/05/13 0548)    ASSESSMENT AND PLAN:   Intermediate coronary syndrome - enzymes are negative for cath today, further plans depending on the results.  Hypothyroid (TSH 33.692 and free T 4 0.77) - ? increase levothyroxine dose to 125 mcg daily, f/u with primary MD  Anticoagulation - Continue heparin drip and ASA. Not placed on Plavix or Effient, will leave to cathing MD if needed.  Hyperlipidemia  - patient needs to be placed on statin (Crestor 40 mg) and encourage diet changes with patient and wife. He gets meds from the [place].      Ventricular bigeminy - has resolved, magnesium is pending  Britt Bottom, PA - Evans Lance , PA-C 9:04 AM 08/05/2013

## 2013-08-05 NOTE — Progress Notes (Signed)
ANTICOAGULATION CONSULT NOTE  Pharmacy Consult for Heparin Indication: chest pain/ACS  Allergies  Allergen Reactions  . Fish Allergy Nausea And Vomiting  . Shellfish Allergy Nausea And Vomiting    Patient Measurements: Height: 6' (182.9 cm) Weight: 266 lb 1.5 oz (120.7 kg) IBW/kg (Calculated) : 77.6 Heparin Dosing Weight: 104.4kg  Vital Signs: Temp: 97.3 F (36.3 C) (08/12 2029) Temp src: Oral (08/12 2029) BP: 181/69 mmHg (08/12 2029) Pulse Rate: 84 (08/12 2029)  Labs:  Recent Labs  08/04/13 1555 08/04/13 1556 08/04/13 2051 08/04/13 2118 08/05/13 0300  HGB 14.7  --   --   --  13.9  HCT 42.6  --   --   --  39.7  PLT 228  --   --   --  207  APTT  --   --   --  31  --   LABPROT  --   --   --  12.7  --   INR  --   --   --  0.97  --   HEPARINUNFRC  --   --   --   --  0.20*  CREATININE 0.90  --   --   --  0.93  TROPONINI  --  <0.30 <0.30  --  <0.30    Estimated Creatinine Clearance: 117.5 ml/min (by C-G formula based on Cr of 0.93).  Assessment: 57 y.o. male with chest pain for heparin Goal of Therapy:  Heparin level 0.3-0.7 units/ml Monitor platelets by anticoagulation protocol: Yes   Plan:  Heparin 2000 units IV bolus, then increase heparin 1750 units/hr F/U after cath  Michael Singleton 08/05/2013,5:34 AM

## 2013-08-05 NOTE — Progress Notes (Signed)
Pt has 1000 meds and order says sips with pills. Pt has had Lymphoma surgery on his neck and has to take large amounts of water to get his pills down. He is afraid that he will need too much water and wants to take his daily 1000 meds after heart cath. Pt did take his chewable ASA .

## 2013-08-05 NOTE — Interval H&P Note (Signed)
History and Physical Interval Note:  08/05/2013 3:41 PM  Michael Singleton  has presented today for cardiac cath with the diagnosis of cp  The various methods of treatment have been discussed with the patient and family. After consideration of risks, benefits and other options for treatment, the patient has consented to  Procedure(s): LEFT HEART CATHETERIZATION WITH CORONARY ANGIOGRAM (N/A) as a surgical intervention .  The patient's history has been reviewed, patient examined, no change in status, stable for surgery.  I have reviewed the patient's chart and labs.  Questions were answered to the patient's satisfaction.    Cath Lab Visit (complete for each Cath Lab visit)  Clinical Evaluation Leading to the Procedure:   ACS: no  Non-ACS:    Anginal Classification: CCS II  Anti-ischemic medical therapy: Minimal Therapy (1 class of medications)  Non-Invasive Test Results: No non-invasive testing performed  Prior CABG: No previous CABG        Michael Singleton

## 2013-08-05 NOTE — CV Procedure (Signed)
Cardiac Catheterization Operative Report  Michael Singleton [id number] 8/13/20143:54 PM Illene Regulus, MD  Procedure Performed:  1. Left Heart Catheterization 2. Selective Coronary Angiography 3. Left ventricular angiogram 4.   PTCA/DES x 1 ostial RCA 5.   PTCA/DES x 1 mid RCA 6.   Angioseal RFA  Operator: Verne Carrow, MD  Arterial access site:  Right radial artery.   Indication: 57 yo male with history of CAD with prior stent placement ? Vessel in 2000 in setting of cardiac arrest ([place], [place]) admitted with bradycardia with episodes of SOB, diaphoresis and chest pain. Cardiac markers negative.                                        Procedure Details: The risks, benefits, complications, treatment options, and expected outcomes were discussed with the patient. The patient and/or family concurred with the proposed plan, giving informed consent. The patient was brought to the cath lab after IV hydration was begun and oral premedication was given. The patient was further sedated with Versed and Fentanyl. The right wrist was assessed with an Allens test which was positive. The right wrist was prepped and draped in a sterile fashion. 1% lidocaine was used for local anesthesia. Using the modified Seldinger access technique, a 5 French sheath was placed in the right radial artery. 3 mg Verapamil was given through the sheath. 5000 units IV heparin was given. Standard diagnostic catheters were used to perform selective coronary angiography. A pigtail catheter was used to perform a left ventricular angiogram. He was found to have severe stenoses in the ostial RCA and the mid RCA. We elected to proceed to intervention.   PCI Note: I could not adequately engage the RCA from the right radial artery due to the ostial nature of the disease. I elected to gain access in the right femoral artery. A 6 French sheath was placed in the right femoral artery. He was given 600 mg Plavix po x 1. He was  given weight based bolus of Angiomax followed by a drip. When the ACT was over 200, I engaged the RCA with a JR4 guide with sideholes. I then passed a BMW wire down the RCA. The ostial lesion was pre-dilated with a 2.5 x 12 mm balloon x 1. I then pre-dilated the mid lesion with the same 2. 5 x 12 mm balloon x 1. There was a large dissection extending from the ostium after the balloon inflation in the ostium. I then performed 2 more long balloon inflations in the proximal vessel. I then deployed a 3.0 x 20 mm Promus Premier in the ostium of the vessel. I then deployed a 3.0 x 12 mm Promus Premier DES in the mid vessel. The mid stent was post-dilated with a 3.25 x 8 mm Kramer balloon x 1. I then post-dilated the ostial stent with a 3.5 x 12 mm [place] balloon x 2. There was an excellent angiographic result. Both stenoses were taken from 99% down to 0%. The sheath was removed from the right radial artery and a Terumo hemostasis band was applied at the arteriotomy site on the right wrist. An Angioseal was placed in the RFA.    There were no immediate complications. The patient was taken to the recovery area in stable condition.   Hemodynamic Findings: Central aortic pressure: 122/63 Left ventricular pressure: 133/13/27  Angiographic Findings:  Left main:  No obstructive  disease.   Left Anterior Descending Artery: Large caliber vessel that courses to the apex. The proximal and mid vessel has diffuse 50% stenosis. The diagonal branch is patent.   Circumflex Artery: Moderate caliber vessel with intermediate branch and small OM branch. Mild plaque.   Right Coronary Artery: Large dominant vessel with 99% ostial stenosis, patent mid stent, 99% mid stenosis beyond the old stent.   Left Ventricular Angiogram: LVEF=55% with inferior wall hypokinesis.  Impression: 1. Severe single vessel CAD now s/p DES ostium of RCA and DES mid RCA 2. Segmental wall motion abnormality with preserved LV systolic function.  3.  Unstable angina  Recommendations: ASA and Plavix for one year. Continue statin. No beta blocker with bradycardia.        Complications:  None. The patient tolerated the procedure well.

## 2013-08-05 NOTE — Progress Notes (Signed)
Utilization Review Completed.   Merrill Villarruel, RN, BSN Nurse Case Manager  [phone]  

## 2013-08-05 NOTE — Progress Notes (Signed)
*  PRELIMINARY RESULTS* Echocardiogram 2D Echocardiogram has been performed.  Michael Singleton 08/05/2013, 10:58 AM

## 2013-08-05 NOTE — Progress Notes (Signed)
08/04/12 Patient arrived to unit around 2020 by stretcher without oxygen or IV pump. He is ambulatory and was able to ambulate from stretcher to bed. He is going for cardiac procedure on 8/13. Consent signed and placed in chart. PCI/Cardiac cath video was watched by patient and fiance. Patient was also placed on heparin drip. He has been resting in bed throughout the night. EKG done also this am and showed SB at 57. PRN tylenol given at 2037 for c/o headache pain.

## 2013-08-05 NOTE — Progress Notes (Signed)
Cath lab called pt will not be returning to regular room. Pt's belongings bagged up and labeled.

## 2013-08-06 ENCOUNTER — Encounter (HOSPITAL_COMMUNITY): Payer: Self-pay | Admitting: Nurse Practitioner

## 2013-08-06 DIAGNOSIS — I200 Unstable angina: Secondary | ICD-10-CM | POA: Diagnosis not present

## 2013-08-06 DIAGNOSIS — I10 Essential (primary) hypertension: Secondary | ICD-10-CM

## 2013-08-06 DIAGNOSIS — I2510 Atherosclerotic heart disease of native coronary artery without angina pectoris: Secondary | ICD-10-CM | POA: Diagnosis not present

## 2013-08-06 LAB — CBC
HCT: 37 % — ABNORMAL LOW (ref 39.0–52.0)
Hemoglobin: 12.8 g/dL — ABNORMAL LOW (ref 13.0–17.0)
RBC: 4.27 MIL/uL (ref 4.22–5.81)

## 2013-08-06 LAB — BASIC METABOLIC PANEL
CO2: 24 mEq/L (ref 19–32)
Calcium: 9.4 mg/dL (ref 8.4–10.5)
Glucose, Bld: 99 mg/dL (ref 70–99)
Sodium: 141 mEq/L (ref 135–145)

## 2013-08-06 LAB — GLUCOSE, CAPILLARY: Glucose-Capillary: 87 mg/dL (ref 70–99)

## 2013-08-06 MED ORDER — NITROGLYCERIN 0.4 MG SL SUBL: 0.4000 mg | SUBLINGUAL_TABLET | SUBLINGUAL | Status: DC | PRN

## 2013-08-06 MED ORDER — LEVOTHYROXINE SODIUM 150 MCG PO TABS: 150.0000 ug | ORAL_TABLET | Freq: Every day | ORAL | Status: DC

## 2013-08-06 MED ORDER — ATORVASTATIN CALCIUM 80 MG PO TABS: 80.0000 mg | ORAL_TABLET | Freq: Every day | ORAL | Status: AC

## 2013-08-06 MED ORDER — ACTIVE PARTNERSHIP FOR HEALTH OF YOUR HEART BOOK
Freq: Once | Status: AC
  Administered 2013-08-06: 04:00:00
  Filled 2013-08-06: qty 1

## 2013-08-06 MED ORDER — CLOPIDOGREL BISULFATE 75 MG PO TABS: 75.0000 mg | ORAL_TABLET | Freq: Every day | ORAL | Status: AC

## 2013-08-06 MED ORDER — ASPIRIN 81 MG PO TABS: 81.0000 mg | ORAL_TABLET | Freq: Every day | ORAL | Status: AC

## 2013-08-06 MED FILL — Sodium Chloride IV Soln 0.9%: INTRAVENOUS | Qty: 50 | Status: AC

## 2013-08-06 NOTE — Discharge Summary (Signed)
Patient seen and examined and history reviewed. Agree with above findings and plan. See earlier rounding note.  Michael Singleton 08/06/2013 9:23 AM

## 2013-08-06 NOTE — Progress Notes (Signed)
CARDIAC REHAB PHASE I   PRE:  Rate/Rhythm: 71SR  BP:  Supine: 118/53  Sitting:   Standing:    SaO2:   MODE:  Ambulation: 1000 ft   POST:  Rate/Rhythm: 85SR  BP:  Supine:   Sitting: 135/59  Standing:    SaO2:  3244-0102 Pt walked 1000 ft with steady gait and no CP. Tolerated well. Education completed. Discussed CRP 2 and permission given to refer to [facility]. Very receptive to ed.   Luetta Nutting, RN BSN  08/06/2013 8:51 AM

## 2013-08-06 NOTE — Discharge Summary (Signed)
Patient seen and examined and history reviewed. Agree with above findings and plan. See earlier rounding note.  Theron Arista JordanMD 08/06/2013 11:45 AM

## 2013-08-06 NOTE — Progress Notes (Signed)
Patient Name: Michael Singleton Date of Encounter: 08/06/2013   Principal Problem:   Unstable angina Active Problems:   HYPOTHYROIDISM   CORONARY ARTERY DISEASE   HYPERLIPIDEMIA   HYPERTENSION   Ventricular bigeminy   SUBJECTIVE  Patient is resting in bed with no complaints. Denies sob, chest pain, or discomfort.  Ambulating w/o difficulty.  MEDS . aspirin  81 mg Oral Daily  . buPROPion  300 mg Oral Daily  . citalopram  40 mg Oral Daily  . clopidogrel  75 mg Oral Q breakfast  . gabapentin  300 mg Oral TID  . levothyroxine  150 mcg Oral QAC breakfast  . sodium chloride  3 mL Intravenous Q12H  . traZODone  200 mg Oral QHS   OBJECTIVE  Filed Vitals:   08/05/13 1937 08/05/13 2155 08/06/13 0039 08/06/13 0627  BP: 170/82 143/66 133/58 118/57  Pulse: 54  67 62  Temp: 97.4 F (36.3 C)  97.8 F (36.6 C)   TempSrc: Oral  Oral Oral  Resp: 15  17 19   Height:      Weight:   272 lb 0.8 oz (123.4 kg)   SpO2: 98%  95% 97%    Intake/Output Summary (Last 24 hours) at 08/06/13 0659 Last data filed at 08/05/13 2200  Gross per 24 hour  Intake 1222.33 ml  Output    750 ml  Net 472.33 ml   Filed Weights   08/05/13 0500 08/05/13 0533 08/06/13 0039  Weight: 266 lb 1.5 oz (120.7 kg) 266 lb 1.5 oz (120.7 kg) 272 lb 0.8 oz (123.4 kg)   PHYSICAL EXAM  General: Pleasant, NAD. Neuro: Alert and oriented X 3. Moves all extremities spontaneously. Psych: Normal affect. HEENT:  Normal  Neck: Supple without bruits or JVD. Lungs:  Resp regular and unlabored, CTA. Heart: RRR no s3, s4. soft systolic murmur heard best at bilat usb. Abdomen: Soft, non-tender, non-distended, BS + x 4.  Extremities: No clubbing, cyanosis or edema. DP/PT/Radials 2+ and equal bilaterally. No bleeding/bruits/hematomas noted over radial or femoral arteries.  Accessory Clinical Findings  CBC  Recent Labs  08/04/13 1555 08/05/13 0300 08/06/13 0555  WBC 7.1 7.1 5.3  NEUTROABS 4.6  --   --   HGB 14.7 13.9  12.8*  HCT 42.6 39.7 37.0*  MCV 85.7 85.7 86.7  PLT 228 207 176   Basic Metabolic Panel  Recent Labs  08/04/13 1555 08/05/13 0300 08/05/13 0820  NA 140 138  --   K 4.3 4.1  --   CL 100 103  --   CO2 27 25  --   GLUCOSE 99 100*  --   BUN 10 11  --   CREATININE 0.90 0.93  --   CALCIUM 9.7 9.0  --   MG  --   --  2.3   Liver Function Tests  Recent Labs  08/05/13 0300  AST 21  ALT 16  ALKPHOS 74  BILITOT 0.4  PROT 6.7  ALBUMIN 3.9   Cardiac Enzymes  Recent Labs  08/04/13 2051 08/05/13 0300 08/05/13 0820  TROPONINI <0.30 <0.30 <0.30   Fasting Lipid Panel  Recent Labs  08/05/13 0300  CHOL 274*  HDL 31*  LDLCALC 188*  TRIG 277*  CHOLHDL 8.8   Thyroid Function Tests  Recent Labs  08/04/13 2118  TSH 33.692*   TELE  rsr  ECG  Sb, 58, no acute st/t changes.  Radiology/Studies  Dg Chest 2 View  08/04/2013   *RADIOLOGY REPORT*  Clinical Data: Chest  pain.  Shortness of breath.  Dizziness. Diaphoresis.  CHEST - 2 VIEW  Comparison:  07/23/2010  Findings:  The heart size and mediastinal contours are within normal limits.  Both lungs are clear.  The visualized skeletal structures are unremarkable.  IMPRESSION: No active cardiopulmonary disease.   Original Report Authenticated By: Myles Rosenthal, M.D.   ASSESSMENT AND PLAN  1.  USA/CAD:  S/p PCI/DES of the RCA x 2 yesterday.  No chest pain/sob this AM.  Ambulating w/o difficulty.  Cont asa, plavix.  Add high potency statin.  2.  Hypothyroidism:  Was on daily previously.  TSH 33.692 with FT4 of 0.77.  Synthroid dose increased to yesterday.  Will need early outpt primary care f/u - followed @ the [place].  3.  HTN:  Trended somewhat high yesterday but improved this AM.  He is not currently on an antihypertensive.  4.  HL:  LDL 188.  Statin naive.  Add lipitor 80.  LFT's ok.  He was on statin in past but was taken off of it by [place] doctor for unknown reasons.    Signed, Nicolasa Ducking NP Patient  seen and examined and history reviewed. Agree with above findings and plan. Doing well post PCI. No radial hematoma. Resuming statin therapy. Thyroid dose increased. Continue dual antiplatelet therapy for one year. OK for DC today.  Theron Arista [facility] 08/06/2013 8:55 AM

## 2013-08-06 NOTE — Discharge Summary (Addendum)
Patient ID: Michael Singleton,  MRN: [id number], DOB/AGE: 1956-11-19 57 y.o.  Admit date: 08/04/2013 Discharge date: 08/06/2013  Primary Care Provider: Marcy Panning [facility] Primary Cardiologist: Marcy Panning [facility]  Discharge Diagnoses Principal Problem:   Unstable angina  **s/p PCI/DES to the ostial and mid RCA this admission. Active Problems:   HYPOTHYROIDISM  **TSH 33.692 with FT4 of 0.77 ->synthroid increased to 150 mcg daily.   CAD (coronary artery disease)   HYPERLIPIDEMIA   HYPERTENSION   Ventricular bigeminy  Allergies Allergies  Allergen Reactions  . Fish Allergy Nausea And Vomiting  . Shellfish Allergy Nausea And Vomiting   Procedures  Cardiac Catheterization and Percutaneous Coronary Intervention 8.13.2014  Hemodynamic Findings: Central aortic pressure: 122/63 Left ventricular pressure: 133/13/27  Angiographic Findings:  Left main:  No obstructive disease.  Left Anterior Descending Artery: Large caliber vessel that courses to the apex. The proximal and mid vessel has diffuse 50% stenosis. The diagonal branch is patent.   Circumflex Artery: Moderate caliber vessel with intermediate branch and small OM branch. Mild plaque.   Right Coronary Artery: Large dominant vessel with 99% ostial stenosis, patent mid stent, 99% mid stenosis beyond the old stent.    **The ostial RCA was successfully stented using a 3.0 x 20 mm Promus Premier DES.  The mid RCA was successfully stented using a 3.0 x 12 mm Promus Premier DES.**  Left Ventricular Angiogram: LVEF=55% with inferior wall hypokinesis. _____________   History of Present Illness  57 y/o male with prior h/o CAD s/p MI's in the 47's.  He is followed at the [facility].  He was in his USOH until approximately one month prior to admission, when he began to experience intermittent chest discomfort, palpitations, and fatigue.  On the day prior to admission, he developed recurrent chest pressure associated with dyspnea,  weakness, and diaphoresis that worsened with activity.  On 8/12, he continued to feel poorly and his wife checked his BP using an automated cuff and found him to be hypertensive @ 170/55 with a recorded RH of 35.  They called EMS and pt was found to be in ventricular bigeminy.  He continued to c/o chest pressure and was treated with SL ntg with resolution of chest discomfort.  He was taken to the [facility] ED for further evaluation where ECG showed resolution of ventricular bigeminy and cardiac markers were normal.  He was admitted for further evaluation.  Hospital Course  Patient ruled out for MI and had no further chest pain.  Decision was made to pursue diagnostic catheterization and this was performed on 8/13, revealing severe ostial and mid-RCA disease as outlined above.  He otherwise had non-obstructive CAD and normal LV function.  The RCA was successfully stented using 2 Promus Premier drug-eluting stents.  Patient tolerated procedure well and post-procedure has been ambulating without further chest discomfort or limitations.  He will be discharged home today in good condition.  Of note, patient has a h/o hypothyroidism and is on levothyroxine at home.  TSH and FT4 were evaluated during this hospitalization and returned abnormal @ 33.692 and 0.77 respectively.  As a result, his home levothyroxine dose has been titrated to 150 mcg daily.  He will require f/u TFT's with his PCP within the next 2-4 wks.  Discharge Vitals Blood pressure 135/59, pulse 64, temperature 97.6 F (36.4 C), temperature source Oral, resp. rate 18, height 6' (1.829 m), weight 272 lb 0.8 oz (123.4 kg), SpO2 97.00%.  Filed Weights   08/05/13 0500 08/05/13 0533  08/06/13 0039  Weight: 266 lb 1.5 oz (120.7 kg) 266 lb 1.5 oz (120.7 kg) 272 lb 0.8 oz (123.4 kg)   Labs  CBC  Recent Labs  08/04/13 1555 08/05/13 0300 08/06/13 0555  WBC 7.1 7.1 5.3  NEUTROABS 4.6  --   --   HGB 14.7 13.9 12.8*  HCT 42.6 39.7 37.0*  MCV 85.7  85.7 86.7  PLT 228 207 176   Basic Metabolic Panel  Recent Labs  08/05/13 0300 08/05/13 0820 08/06/13 0555  NA 138  --  141  K 4.1  --  4.2  CL 103  --  107  CO2 25  --  24  GLUCOSE 100*  --  99  BUN 11  --  11  CREATININE 0.93  --  1.00  CALCIUM 9.0  --  9.4  MG  --  2.3  --    Liver Function Tests  Recent Labs  08/05/13 0300  AST 21  ALT 16  ALKPHOS 74  BILITOT 0.4  PROT 6.7  ALBUMIN 3.9   Cardiac Enzymes  Recent Labs  08/04/13 2051 08/05/13 0300 08/05/13 0820  TROPONINI <0.30 <0.30 <0.30   Fasting Lipid Panel  Recent Labs  08/05/13 0300  CHOL 274*  HDL 31*  LDLCALC 188*  TRIG 277*  CHOLHDL 8.8   Thyroid Function Tests  Recent Labs  08/04/13 2118  TSH 33.692*  FreeT4 0.77. Disposition  Pt is being discharged home today in good condition.  Follow-up Plans & Appointments      Follow-up Information   Follow up with [facility]. (1-2 wks.)      Discharge Medications    Medication List         aspirin 81 MG tablet  Take 1 tablet (81 mg total) by mouth daily.     atorvastatin 80 MG tablet  Commonly known as:  LIPITOR  Take 1 tablet (80 mg total) by mouth daily.     buPROPion 300 MG 24 hr tablet  Commonly known as:  WELLBUTRIN XL  Take 300 mg by mouth daily.     citalopram 40 MG tablet  Commonly known as:  CELEXA  Take 40 mg by mouth daily.     clopidogrel 75 MG tablet  Commonly known as:  PLAVIX  Take 1 tablet (75 mg total) by mouth daily with breakfast.     gabapentin 300 MG capsule  Commonly known as:  NEURONTIN  Take 300 mg by mouth 3 (three) times daily.     levothyroxine 150 MCG tablet  Commonly known as:  SYNTHROID, LEVOTHROID  Take 1 tablet (150 mcg total) by mouth daily before breakfast.     nitroGLYCERIN 0.4 MG SL tablet  Commonly known as:  NITROSTAT  Place 1 tablet (0.4 mg total) under the tongue every 5 (five) minutes x 3 doses as needed for chest pain.     PRESCRIPTION MEDICATION  Apply 1  application topically at bedtime. Cream from [place] for dermatitis     traZODone 100 MG tablet  Commonly known as:  DESYREL  Take 200 mg by mouth at bedtime.        Outstanding Labs/Studies  Pt will require f/u TFT's in 2-4 wks @ PCP.  He will also need f/u lipids/lft's in 8 wks.  Duration of Discharge Encounter   Greater than 30 minutes including physician time.  Signed, Nicolasa Ducking NP 08/06/2013, 10:42 AM

## 2013-08-12 ENCOUNTER — Emergency Department (HOSPITAL_COMMUNITY): Payer: Non-veteran care

## 2013-08-12 ENCOUNTER — Observation Stay (HOSPITAL_COMMUNITY)
Admission: EM | Admit: 2013-08-12 | Discharge: 2013-08-14 | Disposition: A | Discharge status: Home / Self Care | Payer: Non-veteran care | Attending: Cardiovascular Disease | Admitting: Cardiovascular Disease

## 2013-08-12 ENCOUNTER — Telehealth: Payer: Self-pay | Admitting: Cardiovascular Disease

## 2013-08-12 ENCOUNTER — Encounter (HOSPITAL_COMMUNITY): Payer: Self-pay | Admitting: Emergency Medicine

## 2013-08-12 DIAGNOSIS — I2510 Atherosclerotic heart disease of native coronary artery without angina pectoris: Secondary | ICD-10-CM

## 2013-08-12 DIAGNOSIS — Z9861 Coronary angioplasty status: Secondary | ICD-10-CM | POA: Insufficient documentation

## 2013-08-12 DIAGNOSIS — I6529 Occlusion and stenosis of unspecified carotid artery: Secondary | ICD-10-CM | POA: Insufficient documentation

## 2013-08-12 DIAGNOSIS — Z87820 Personal history of traumatic brain injury: Secondary | ICD-10-CM | POA: Insufficient documentation

## 2013-08-12 DIAGNOSIS — R51 Headache: Secondary | ICD-10-CM | POA: Insufficient documentation

## 2013-08-12 DIAGNOSIS — E785 Hyperlipidemia, unspecified: Secondary | ICD-10-CM | POA: Insufficient documentation

## 2013-08-12 DIAGNOSIS — R42 Dizziness and giddiness: Secondary | ICD-10-CM | POA: Insufficient documentation

## 2013-08-12 DIAGNOSIS — Z79899 Other long term (current) drug therapy: Secondary | ICD-10-CM | POA: Insufficient documentation

## 2013-08-12 DIAGNOSIS — C8589 Other specified types of non-Hodgkin lymphoma, extranodal and solid organ sites: Secondary | ICD-10-CM | POA: Insufficient documentation

## 2013-08-12 DIAGNOSIS — I498 Other specified cardiac arrhythmias: Principal | ICD-10-CM | POA: Insufficient documentation

## 2013-08-12 DIAGNOSIS — G4733 Obstructive sleep apnea (adult) (pediatric): Secondary | ICD-10-CM | POA: Insufficient documentation

## 2013-08-12 DIAGNOSIS — R5383 Other fatigue: Secondary | ICD-10-CM

## 2013-08-12 DIAGNOSIS — Z7902 Long term (current) use of antithrombotics/antiplatelets: Secondary | ICD-10-CM | POA: Insufficient documentation

## 2013-08-12 DIAGNOSIS — I658 Occlusion and stenosis of other precerebral arteries: Secondary | ICD-10-CM | POA: Insufficient documentation

## 2013-08-12 DIAGNOSIS — E039 Hypothyroidism, unspecified: Secondary | ICD-10-CM | POA: Insufficient documentation

## 2013-08-12 DIAGNOSIS — I10 Essential (primary) hypertension: Secondary | ICD-10-CM | POA: Insufficient documentation

## 2013-08-12 DIAGNOSIS — R5381 Other malaise: Secondary | ICD-10-CM | POA: Diagnosis not present

## 2013-08-12 DIAGNOSIS — I252 Old myocardial infarction: Secondary | ICD-10-CM | POA: Insufficient documentation

## 2013-08-12 HISTORY — DX: Disorder of arteries and arterioles, unspecified: I77.9

## 2013-08-12 HISTORY — DX: Other specified cardiac arrhythmias: I49.8

## 2013-08-12 HISTORY — DX: Cardiac arrhythmia, unspecified: I49.9

## 2013-08-12 HISTORY — DX: Peripheral vascular disease, unspecified: I73.9

## 2013-08-12 LAB — TROPONIN I: Troponin I: <0.3 ng/mL (ref <0.30)

## 2013-08-12 LAB — CBC WITH DIFFERENTIAL/PLATELET
Basophils Absolute: 0 10*3/uL (ref 0.0–0.1)
Eosinophils Relative: 2 % (ref 0–5)
Lymphocytes Relative: 21 % (ref 12–46)
Lymphs Abs: 1.6 10*3/uL (ref 0.7–4.0)
MCV: 86.2 fL (ref 78.0–100.0)
Neutrophils Relative %: 67 % (ref 43–77)
Platelets: 196 10*3/uL (ref 150–400)
RBC: 4.43 MIL/uL (ref 4.22–5.81)
RDW: 13.2 % (ref 11.5–15.5)
WBC: 7.5 10*3/uL (ref 4.0–10.5)

## 2013-08-12 LAB — CBC
MCHC: 35.4 g/dL (ref 30.0–36.0)
RDW: 13.4 % (ref 11.5–15.5)

## 2013-08-12 LAB — CREATININE, SERUM
Creatinine, Ser: 0.95 mg/dL (ref 0.50–1.35)
GFR calc non Af Amer: >90 mL/min (ref >90)

## 2013-08-12 LAB — PLATELET INHIBITION P2Y12: Platelet Function  P2Y12: 150 [PRU] — ABNORMAL LOW (ref 194–418)

## 2013-08-12 LAB — MAGNESIUM: Magnesium: 2.5 mg/dL (ref 1.5–2.5)

## 2013-08-12 LAB — BASIC METABOLIC PANEL
CO2: 25 mEq/L (ref 19–32)
Calcium: 9.1 mg/dL (ref 8.4–10.5)
GFR calc non Af Amer: >90 mL/min (ref >90)
Potassium: 4.3 mEq/L (ref 3.5–5.1)
Sodium: 138 mEq/L (ref 135–145)

## 2013-08-12 LAB — POCT I-STAT TROPONIN I: Troponin i, poc: 0 ng/mL (ref 0.00–0.08)

## 2013-08-12 MED ORDER — CLOPIDOGREL BISULFATE 75 MG PO TABS
75.0000 mg | ORAL_TABLET | Freq: Every day | ORAL | Status: DC
  Administered 2013-08-12 – 2013-08-13 (×2): 75 mg via ORAL
  Filled 2013-08-12 (×2): qty 1

## 2013-08-12 MED ORDER — ONDANSETRON HCL 4 MG/2ML IJ SOLN: 4.0000 mg | Freq: Four times a day (QID) | INTRAMUSCULAR | Status: DC | PRN

## 2013-08-12 MED ORDER — ASPIRIN 81 MG PO CHEW: 324.0000 mg | CHEWABLE_TABLET | Freq: Once | ORAL | Status: DC

## 2013-08-12 MED ORDER — SODIUM CHLORIDE 0.9 % IJ SOLN
3.0000 mL | Freq: Two times a day (BID) | INTRAMUSCULAR | Status: DC
  Administered 2013-08-12 – 2013-08-13 (×3): 3 mL via INTRAVENOUS

## 2013-08-12 MED ORDER — CITALOPRAM HYDROBROMIDE 40 MG PO TABS
40.0000 mg | ORAL_TABLET | Freq: Every day | ORAL | Status: DC
  Administered 2013-08-12 – 2013-08-13 (×2): 40 mg via ORAL
  Filled 2013-08-12 (×3): qty 1

## 2013-08-12 MED ORDER — BUPROPION HCL ER (XL) 300 MG PO TB24
300.0000 mg | ORAL_TABLET | Freq: Every day | ORAL | Status: DC
  Administered 2013-08-12 – 2013-08-13 (×2): 300 mg via ORAL
  Filled 2013-08-12 (×3): qty 1

## 2013-08-12 MED ORDER — HEPARIN SODIUM (PORCINE) 5000 UNIT/ML IJ SOLN
5000.0000 [IU] | Freq: Three times a day (TID) | INTRAMUSCULAR | Status: DC
  Administered 2013-08-12 – 2013-08-14 (×5): 5000 [IU] via SUBCUTANEOUS
  Filled 2013-08-12 (×8): qty 1

## 2013-08-12 MED ORDER — ATORVASTATIN CALCIUM 80 MG PO TABS
80.0000 mg | ORAL_TABLET | Freq: Every day | ORAL | Status: DC
  Administered 2013-08-13: 80 mg via ORAL
  Filled 2013-08-12 (×3): qty 1

## 2013-08-12 MED ORDER — SODIUM CHLORIDE 0.9 % IV SOLN: 250.0000 mL | INTRAVENOUS | Status: DC | PRN

## 2013-08-12 MED ORDER — NITROGLYCERIN 0.4 MG SL SUBL: 0.4000 mg | SUBLINGUAL_TABLET | SUBLINGUAL | Status: DC | PRN

## 2013-08-12 MED ORDER — TRAZODONE HCL 100 MG PO TABS
200.0000 mg | ORAL_TABLET | Freq: Every day | ORAL | Status: DC
  Administered 2013-08-12 – 2013-08-13 (×2): 200 mg via ORAL
  Filled 2013-08-12 (×3): qty 2

## 2013-08-12 MED ORDER — CITALOPRAM HYDROBROMIDE 40 MG PO TABS: 60.0000 mg | ORAL_TABLET | Freq: Every day | ORAL | Status: DC

## 2013-08-12 MED ORDER — SODIUM CHLORIDE 0.9 % IJ SOLN: 3.0000 mL | INTRAMUSCULAR | Status: DC | PRN

## 2013-08-12 MED ORDER — ACETAMINOPHEN 325 MG PO TABS: 650.0000 mg | ORAL_TABLET | ORAL | Status: DC | PRN

## 2013-08-12 MED ORDER — METOPROLOL TARTRATE 12.5 MG HALF TABLET
12.5000 mg | ORAL_TABLET | Freq: Two times a day (BID) | ORAL | Status: DC
  Administered 2013-08-12 – 2013-08-13 (×4): 12.5 mg via ORAL
  Filled 2013-08-12 (×6): qty 1

## 2013-08-12 MED ORDER — NITROGLYCERIN 0.4 MG SL SUBL
0.4000 mg | SUBLINGUAL_TABLET | SUBLINGUAL | Status: DC | PRN
  Filled 2013-08-12: qty 25

## 2013-08-12 MED ORDER — LEVOTHYROXINE SODIUM 150 MCG PO TABS
150.0000 ug | ORAL_TABLET | Freq: Every day | ORAL | Status: DC
  Administered 2013-08-13 – 2013-08-14 (×2): 150 ug via ORAL
  Filled 2013-08-12 (×3): qty 1

## 2013-08-12 MED ORDER — VITAMIN E 180 MG (400 UNIT) PO CAPS
400.0000 [IU] | ORAL_CAPSULE | Freq: Every day | ORAL | Status: DC
  Administered 2013-08-13: 400 [IU] via ORAL
  Filled 2013-08-12 (×3): qty 1

## 2013-08-12 MED ORDER — GABAPENTIN 300 MG PO CAPS
300.0000 mg | ORAL_CAPSULE | Freq: Three times a day (TID) | ORAL | Status: DC
  Administered 2013-08-12 – 2013-08-13 (×4): 300 mg via ORAL
  Filled 2013-08-12 (×7): qty 1

## 2013-08-12 MED ORDER — ASPIRIN 81 MG PO CHEW
81.0000 mg | CHEWABLE_TABLET | Freq: Every day | ORAL | Status: DC
  Administered 2013-08-13: 81 mg via ORAL
  Filled 2013-08-12 (×2): qty 1

## 2013-08-12 MED ORDER — ADULT MULTIVITAMIN W/MINERALS CH
1.0000 | ORAL_TABLET | Freq: Every day | ORAL | Status: DC
  Administered 2013-08-13: 1 via ORAL
  Filled 2013-08-12 (×3): qty 1

## 2013-08-12 NOTE — ED Notes (Signed)
Pt.s  Heart rhythm  Changed from NSR to  ventricle bigeminy.  EKG done and shown to Dr. Manus Gunning.   Pt. Also having a feeling of heartburn that lasted 5 minutes.  Pt. Denies any pain presently.

## 2013-08-12 NOTE — ED Notes (Signed)
Cardiology at the bedside.

## 2013-08-12 NOTE — ED Notes (Signed)
EMS from home for dizziness. EMS arrival to find same. Hx of stents placed 08/05/13 ([place]). In bigemy for transport, HR 140's for EMS, manual 30's. Converted to NSR during transport.

## 2013-08-12 NOTE — ED Provider Notes (Signed)
CSN: [id number]     Arrival date & time 08/12/13  1308 History     First MD Initiated Contact with Patient 08/12/13 1319     Chief Complaint  Patient presents with  . Dizziness   (Consider location/radiation/quality/duration/timing/severity/associated sxs/prior Treatment) HPI Comments: Patient is a 57 year old male with a past medical history of CAD with stents placed 1 week ago, history of B cell lymphoma, hypertension and anxiety who presents after an episode of dizziness that occurred prior to arrival. Patient reports sudden onset of dizziness, described as room-spinning with lightheadedness. Patient reports he had to sit down because he felt like he was going to pass out. Patient reports associated double vision in both eyes. Patient denies chest pain, SOB, vision loss. The episode lasted about 1.5 hours before resolving. No aggravating/alleviating factors. Patient has never experienced this before.    Past Medical History  Diagnosis Date  . Inguinal hernia, left   . Lumbar disc disease     a. with chronic LBP  . Occlusion and stenosis of carotid artery without mention of cerebral infarction   . Allergic rhinitis, cause unspecified   . Lumbago   . Male infertility, unspecified   . Posttraumatic stress disorder   . Diverticulosis of colon (without mention of hemorrhage)   . Alcoholism in family   . Unspecified hypothyroidism   . Unspecified essential hypertension   . Hyperlipemia   . CAD (coronary artery disease)     a. s/p MI's in 1995 and 1997 with prior stenting;  b. 07/2013 Cath/PCI: LM nl, LAD 50p/m, D1 nl, LCX nl, RI nl, RCA dominant, 99ost (3.0x20 Promus Premier DES)/47m(3.0x12 Promus Premier DES), EF 55% w/ inf HK.  . Closed TBI (traumatic brain injury) 1974    "memory issues since" (08/04/2013)  . Heart murmur   . OSA (obstructive sleep apnea)     a. not using CPAP.  . Migraines     "maybe once or twice/yr" (08/04/2013)  . Arthritis     "hands, neck, back, hips"  (08/04/2013)  . Anxiety   . Depression   . Diffuse large B cell lymphoma     a. s/p radical neck dissection   Past Surgical History  Procedure Laterality Date  . Rotator cuff repair    . Deep neck lymph node biopsy / excision Right 07/2010  . Radical neck dissection Right ~ 09/2010    "'for lymphoma;  (Dr Lazarus Salines)" (08/04/2013)  . Tonsillectomy  ~ 09/2010  . Umbilical hernia repair  ?1990's  . Inguinal hernia repair Left 2013  . Shoulder arthroscopy w/ rotator cuff repair Left 2000's  . Coronary angioplasty with stent placement  1995  . 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 Cancer Mother   . Heart disease Mother   . Alcohol abuse Other   . Heart attack Father     3 heart attacks    History  Substance Use Topics  . Smoking status: Former Smoker -- 1.50 packs/day for 35 years    Types: Cigarettes    Quit date: 04/11/2004  . Smokeless tobacco: Never Used  . Alcohol Use: Yes     Comment: Hx abuse, quit 2005    Review of Systems  Neurological: Positive for dizziness and light-headedness.  All other systems reviewed and are negative.    Allergies  Fish allergy and Shellfish allergy  Home Medications   Current Outpatient Rx  Name  Route  Sig  Dispense  Refill  . aspirin 81 MG tablet   Oral   Take 1 tablet (81 mg total) by mouth daily.   30 tablet      . atorvastatin (LIPITOR) 80 MG tablet   Oral   Take 1 tablet (80 mg total) by mouth daily.   30 tablet   6   . buPROPion (WELLBUTRIN XL) 300 MG 24 hr tablet   Oral   Take 300 mg by mouth daily.         . citalopram (CELEXA) 40 MG tablet   Oral   Take 40 mg by mouth daily.         . clopidogrel (PLAVIX) 75 MG tablet   Oral   Take 1 tablet (75 mg total) by mouth daily with breakfast.   30 tablet   6   . gabapentin (NEURONTIN) 300 MG capsule   Oral   Take 300 mg by mouth 3 (three) times daily.         Marland Kitchen levothyroxine (SYNTHROID, LEVOTHROID) 150  MCG tablet   Oral   Take 1 tablet (150 mcg total) by mouth daily before breakfast.   30 tablet   2   . nitroGLYCERIN (NITROSTAT) 0.4 MG SL tablet   Sublingual   Place 1 tablet (0.4 mg total) under the tongue every 5 (five) minutes x 3 doses as needed for chest pain.   25 tablet   3   . PRESCRIPTION MEDICATION   Topical   Apply 1 application topically at bedtime. Cream from [place] for dermatitis         . traZODone (DESYREL) 100 MG tablet   Oral   Take 200 mg by mouth at bedtime.          There were no vitals taken for this visit. Physical Exam  Nursing note and vitals reviewed. Constitutional: He is oriented to person, place, and time. He appears well-developed and well-nourished. No distress.  HENT:  Head: Normocephalic and atraumatic.  Mouth/Throat: Oropharynx is clear and moist. No oropharyngeal exudate.  Eyes: Conjunctivae and EOM are normal. Pupils are equal, round, and reactive to light.  Neck: Normal range of motion.  Cardiovascular: Normal rate and regular rhythm.  Exam reveals no gallop and no friction rub.   No murmur heard. Pulmonary/Chest: Effort normal and breath sounds normal. He has no wheezes. He has no rales. He exhibits no tenderness.  Abdominal: Soft. He exhibits no distension. There is no tenderness. There is no rebound and no guarding.  Musculoskeletal: Normal range of motion.  Neurological: He is alert and oriented to person, place, and time. No cranial nerve deficit. Coordination normal.  Extremity strength and sensation equal and intact bilaterally. Speech is goal-oriented. Moves limbs without ataxia.   Skin: Skin is warm and dry.  Psychiatric: He has a normal mood and affect. His behavior is normal.    ED Course   Procedures (including critical care time)   Date: 08/12/2013  Rate: 63  Rhythm: normal sinus rhythm  QRS Axis: normal  Intervals: normal  ST/T Wave abnormalities: normal  Conduction Disutrbances:none  Narrative Interpretation: NSR  without changes, improved from previous where there were ST changes  Old EKG Reviewed: changes noted    Labs Reviewed  CBC WITH DIFFERENTIAL - Abnormal; Notable for the following:    HCT 38.2 (*)    All other components within normal limits  PLATELET INHIBITION P2Y12 - Abnormal; Notable for the following:    Platelet Function  P2Y12 150 (*)    All other components within normal limits  CBC - Abnormal; Notable for the following:    HCT 38.1 (*)    All other components within normal limits  BASIC METABOLIC PANEL - Abnormal; Notable for the following:    GFR calc non Af Amer 78 (*)    All other components within normal limits  CBC - Abnormal; Notable for the following:    HCT 38.9 (*)    All other components within normal limits  BASIC METABOLIC PANEL - Abnormal; Notable for the following:    GFR calc non Af Amer 68 (*)    GFR calc Af Amer 79 (*)    All other components within normal limits  BASIC METABOLIC PANEL  MAGNESIUM  TROPONIN I  TROPONIN I  TROPONIN I  CREATININE, SERUM  POCT I-STAT TROPONIN I   No results found.  1. Dizziness   2. Coronary atherosclerosis of unspecified type of vessel, native or graft   3. Fatigue   4. Obstructive sleep apnea (adult) (pediatric)   5. Unspecified hypothyroidism   6. Ventricular bigeminy   7. CAD (coronary artery disease)   8. Occlusion and stenosis of carotid artery without mention of cerebral infarction, bilateral     MDM  1:25 PM Labs pending. EKG unremarkable. Patient will have CT head. Patient is asymptomatic at this time. Vitals stable and patient afebrile.   Labs show no acute changes. Troponin negative. [facility] will see the patient.    Emilia Beck, PA-C 08/14/13 1609

## 2013-08-12 NOTE — Telephone Encounter (Signed)
Spoke with patient's wife who states patient is dizzy today and she just checked his vital signs which are BP:  145/50, HR 37.  Wife states she has rechecked pulse multiple times.  Wife reports patient had cardiac cath with 2 stents on 8/13.  Patient reports from the background that he feels fine, denies chest pain or pressure, SOB, just feels dizzy.  I advised that wife needs to call 911 and have patient transported to the hospital.  Patient's wife verbalized understanding. I notified Trish, hospital liason that patient is en route.

## 2013-08-12 NOTE — Telephone Encounter (Signed)
Pt's wife calling re BP 145/50 37 pulse, pt dizzy, has pa appt tomorrow

## 2013-08-12 NOTE — H&P (Signed)
History and Physical  Patient ID: Michael Singleton MRN: [id number], DOB: [birth year] Date of Encounter: 08/12/2013, 3:14 PM Primary Physician: Illene Regulus, MD [facility] now Dr. Clifton James  Chief Complaint: dizziness, fatigue Reason for Admission: ventricular bigeminy, epigastric pain  HPI: Michael Singleton is a 57 y/o M with history of CAD (MIs in 1990s with prior stents, recent DESx2 to ostial/mid RCA 08/05/13), hypothyroidism with recent increase in levothyroxine, ventricular bigeminy, HTN, untreated OSA, carotid artery disease, stable b-cell lymphoma who presented to [facility] with complaints of dizziness similar to last admission.   Last admission on 08/04/13 Michael Singleton presented with dizziness, SOB, and severe CP. His home HR monitor had registered in the 30's so EMS was called - Michael Singleton was found to be in ventricular bigeminy at that time. Cardiac cath showed 99% ostial RCA, patent mid RCA stent, and 99% mid stenosis beyond stent. Michael Singleton received DES to ostium of RCA and DES to mid RCA. Otherwise Michael Singleton had 50% prox and mid LAD disease. EF was 55% with inferior wall HK. Bigeminy resolved and Michael Singleton was discharged home. When PVCs had resolved, it appears HR ran 50s-70s.   This admission, Michael Singleton presents with 3 day history of intermittent dizziness and fatigue. His fiance checked his pulse at home which again registered in the 30's. EMS was called and Michael Singleton was found to be back in ventricular bigeminy (low HR likely pseudobradycardia from PVCs). Got 32mg  ASA. His fiance also says Michael Singleton's been more SOB since discharge but Michael Singleton hadn't noticed this. Initially Michael Singleton denied any CP but while in the ER, developed transient epigastric burning for several minutes that resolved spontaneously. No syncope, orthopnea, LEE, weight changes, med noncompliance, tachycardia, tachypnea or hypoxia. CBC, BMET unremarkable and troponin neg x 1. F/u EKG NSR 63bpm no acute ST-T changes. ER has ordered head CT as Michael Singleton also reported  increase in headaches.  Past Medical History  Diagnosis Date  . Inguinal hernia, left   . Lumbar disc disease     a. with chronic LBP  . Carotid artery disease     a. Dopplers 01/2012: 60-79% bilat carotid dz.  . Allergic rhinitis, cause unspecified   . Lumbago   . Male infertility, unspecified   . Posttraumatic stress disorder   . Diverticulosis of colon (without mention of hemorrhage)   . Unspecified hypothyroidism   . Unspecified essential hypertension   . Hyperlipemia   . CAD (coronary artery disease)     a. s/p MI's in 1995 and 1997 with prior stenting;  b. 07/2013 Cath/PCI: LM nl, LAD 50p/m, D1 nl, LCX nl, RI nl, RCA dominant, 99ost (3.0x20 Promus Premier DES)/5m(3.0x12 Promus Premier DES), EF 55% w/ inf HK.  . Closed TBI (traumatic brain injury) 1974    "memory issues since" (08/04/2013)  . Heart murmur   . OSA (obstructive sleep apnea)     a. not using CPAP.  . Migraines     "maybe once or twice/yr" (08/04/2013)  . Arthritis     "hands, neck, back, hips" (08/04/2013)  . Anxiety   . Depression   . Diffuse large B cell lymphoma     a. s/p radical neck dissection. b. followed once a year - last check 09/2012 was OK.     Most Recent Cardiac Studies: 2D Echo 08/05/13 - Left ventricle: Inferobasal hypokinesis The cavity size was normal. Systolic function was normal. The estimated ejection fraction was in the range of 50% to 55%. Wall motion was normal; there  were no regional wall motion abnormalities. - Left atrium: The atrium was mildly dilated.  Cardiac Cath 08/05/13 Procedure Performed:  1. Left Heart Catheterization 2. Selective Coronary Angiography 3. Left ventricular angiogram 4. PTCA/DES x 1 ostial RCA  5. PTCA/DES x 1 mid RCA  6. Angioseal RFA  Operator: Verne Carrow, MD  Arterial access site: Right radial artery.  Indication: 57 yo male with history of CAD with prior stent placement ? Vessel in 2000 in setting of cardiac arrest ([place], [place]) admitted  with bradycardia with episodes of SOB, diaphoresis and chest pain. Cardiac markers negative.  Procedure Details:  The risks, benefits, complications, treatment options, and expected outcomes were discussed with the patient. The patient and/or family concurred with the proposed plan, giving informed consent. The patient was brought to the cath lab after IV hydration was begun and oral premedication was given. The patient was further sedated with Versed and Fentanyl. The right wrist was assessed with an Allens test which was positive. The right wrist was prepped and draped in a sterile fashion. 1% lidocaine was used for local anesthesia. Using the modified Seldinger access technique, a 5 French sheath was placed in the right radial artery. 3 mg Verapamil was given through the sheath. 5000 units IV heparin was given. Standard diagnostic catheters were used to perform selective coronary angiography. A pigtail catheter was used to perform a left ventricular angiogram. Michael Singleton was found to have severe stenoses in the ostial RCA and the mid RCA. We elected to proceed to intervention.  PCI Note: I could not adequately engage the RCA from the right radial artery due to the ostial nature of the disease. I elected to gain access in the right femoral artery. A 6 French sheath was placed in the right femoral artery. Michael Singleton was given 600 mg Plavix po x 1. Michael Singleton was given weight based bolus of Angiomax followed by a drip. When the ACT was over 200, I engaged the RCA with a JR4 guide with sideholes. I then passed a BMW wire down the RCA. The ostial lesion was pre-dilated with a 2.5 x 12 mm balloon x 1. I then pre-dilated the mid lesion with the same 2. 5 x 12 mm balloon x 1. There was a large dissection extending from the ostium after the balloon inflation in the ostium. I then performed 2 more long balloon inflations in the proximal vessel. I then deployed a 3.0 x 20 mm Promus Premier in the ostium of the vessel. I then deployed a 3.0 x 12  mm Promus Premier DES in the mid vessel. The mid stent was post-dilated with a 3.25 x 8 mm [place] balloon x 1. I then post-dilated the ostial stent with a 3.5 x 12 mm [place] balloon x 2. There was an excellent angiographic result. Both stenoses were taken from 99% down to 0%. The sheath was removed from the right radial artery and a Terumo hemostasis band was applied at the arteriotomy site on the right wrist. An Angioseal was placed in the RFA.  There were no immediate complications. The patient was taken to the recovery area in stable condition.  Hemodynamic Findings:  Central aortic pressure: 122/63  Left ventricular pressure: 133/13/27  Angiographic Findings:  Left main: No obstructive disease.  Left Anterior Descending Artery: Large caliber vessel that courses to the apex. The proximal and mid vessel has diffuse 50% stenosis. The diagonal branch is patent.  Circumflex Artery: Moderate caliber vessel with intermediate branch and small OM branch. Mild plaque.  Right Coronary Artery: Large dominant vessel with 99% ostial stenosis, patent mid stent, 99% mid stenosis beyond the old stent.  Left Ventricular Angiogram: LVEF=55% with inferior wall hypokinesis.  Impression:  1. Severe single vessel CAD now s/p DES ostium of RCA and DES mid RCA  2. Segmental wall motion abnormality with preserved LV systolic function.  3. Unstable angina  Recommendations: ASA and Plavix for one year. Continue statin. No beta blocker with bradycardia.  Complications: None. The patient tolerated the procedure well.      Surgical History:  Past Surgical History  Procedure Laterality Date  . Rotator cuff repair    . Deep neck lymph node biopsy / excision Right 07/2010  . Radical neck dissection Right ~ 09/2010    "'for lymphoma;  (Dr Lazarus Salines)" (08/04/2013)  . Tonsillectomy  ~ 09/2010  . Umbilical hernia repair  ?1990's  . Inguinal hernia repair Left 2013  . Shoulder arthroscopy w/ rotator cuff repair Left 2000's  .  Coronary angioplasty with stent placement  1995  . 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 Authorizing Provider  aspirin 81 MG tablet Take 1 tablet (81 mg total) by mouth daily. 08/06/13  Yes Ok Anis, NP  atorvastatin (LIPITOR) 80 MG tablet Take 1 tablet (80 mg total) by mouth daily. 08/06/13  Yes Ok Anis, NP  buPROPion (WELLBUTRIN XL) 300 MG 24 hr tablet Take 300 mg by mouth daily.   Yes Historical Provider, MD  citalopram (CELEXA) 40 MG tablet Take 60 mg by mouth daily.    Yes Historical Provider, MD  clopidogrel (PLAVIX) 75 MG tablet Take 1 tablet (75 mg total) by mouth daily with breakfast. 08/06/13  Yes Ok Anis, NP  gabapentin (NEURONTIN) 300 MG capsule Take 300 mg by mouth 3 (three) times daily.   Yes Historical Provider, MD  levothyroxine (SYNTHROID, LEVOTHROID) 150 MCG tablet Take 1 tablet (150 mcg total) by mouth daily before breakfast. 08/06/13  Yes Ok Anis, NP  Multiple Vitamin (MULTIVITAMIN WITH MINERALS) TABS tablet Take 1 tablet by mouth daily.   Yes Historical Provider, MD  nitroGLYCERIN (NITROSTAT) 0.4 MG SL tablet Place 1 tablet (0.4 mg total) under the tongue every 5 (five) minutes x 3 doses as needed for chest pain. 08/06/13  Yes Ok Anis, NP  PRESCRIPTION MEDICATION Apply 1 application topically at bedtime. Cream from [place] for dermatitis   Yes Historical Provider, MD  traZODone (DESYREL) 100 MG tablet Take 200 mg by mouth at bedtime.   Yes Historical Provider, MD  vitamin E 400 UNIT capsule Take 400 Units by mouth daily.   Yes Historical Provider, MD    Allergies:  Allergies  Allergen Reactions  . Fish Allergy Nausea And Vomiting  . Shellfish Allergy Nausea And Vomiting    History   Social History  . Marital Status: Married    Spouse Name: N/A    Number of Children: N/A  . Years of Education: N/A   Occupational History    . Disabled    Social History Main Topics  . Smoking status: Former Smoker -- 1.50 packs/day for 35 years    Types: Cigarettes    Quit date: 04/11/2004  . Smokeless tobacco: Never Used  . Alcohol Use: Yes     Comment: Hx abuse, quit 2005  . Drug Use: No  . Sexual Activity: No   Other Topics  Concern  . 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  Lives with wife. They  lost her son in a motorcycle accident by a drunk driver.  Close to his 23 year old granddaughter.      Family History  Problem Relation Age of Onset  . Cancer Mother   . Heart disease Mother   . Alcohol abuse Other   . Heart attack Father     3 heart attacks     Review of Systems: General: negative for chills, fever, night sweats or weight changes.  Cardiovascular: see above Dermatological: negative for rash Respiratory: negative for cough or wheezing Urologic: negative for hematuria Abdominal: negative for nausea, vomiting, diarrhea, bright red blood per rectum, melena, or hematemesis Neurologic: negative for visual changes, syncope, or dizziness All other systems reviewed and are otherwise negative except as noted above.  Labs:   Lab Results  Component Value Date   WBC 7.5 08/12/2013   HGB 13.4 08/12/2013   HCT 38.2* 08/12/2013   MCV 86.2 08/12/2013   PLT 196 08/12/2013     Recent Labs Lab 08/12/13 1324  NA 138  K 4.3  CL 103  CO2 25  BUN 11  CREATININE 0.89  CALCIUM 9.1  GLUCOSE 92   Troponin neg x 1   Lab Results  Component Value Date   CHOL 274* 08/05/2013   HDL 31* 08/05/2013   LDLCALC 188* 08/05/2013   TRIG 277* 08/05/2013    Radiology/Studies:  Dg Chest 2 View 08/04/2013   *RADIOLOGY REPORT*  Clinical Data: Chest pain.  Shortness of breath.  Dizziness. Diaphoresis.  CHEST - 2 VIEW  Comparison:  07/23/2010  Findings:  The heart size and mediastinal contours are within normal limits.  Both lungs  are clear.  The visualized skeletal structures are unremarkable.  IMPRESSION: No active cardiopulmonary disease.   Original Report Authenticated By: Myles Rosenthal, M.D.   Ct Head Wo Contrast 08/04/2013   *RADIOLOGY REPORT*  Clinical Data: Right arm weakness and numbness, history of carotid stenosis and lymphoma  CT HEAD WITHOUT CONTRAST  Technique:  Contiguous axial images were obtained from the base of the skull through the vertex without contrast.  Comparison: Head CT - 04/11/2011  Findings:  Michael Singleton white differentiation is maintained.  No CT evidence of acute large territory infarct.  No intraparenchymal or extra-axial mass or hemorrhage.  Normal size and configuration of the ventricles and basilar cisterns.  No midline shift.   LImited visualization of the paranasal sinuses and mastoid air cells are normal.  Regional soft tissues are normal.  No displaced calvarial fracture.  IMPRESSION: Negative noncontrast head CT.   Original Report Authenticated By: Tacey Ruiz, MD     EKG: tracings from the ER: 1. NSR 63bpm no ectopy, no acute changes, QTc 427 2. Sinus bradycardia 68bpm ventricular bigeminy (monomorphic PVCs), no acute ST-T changes  EMS tracings c/w bigeminy as well  Physical Exam: Blood pressure 131/52, pulse 68, temperature 97.7 F (36.5 C), temperature source Oral, resp. rate 11, SpO2 98.00%. General: Well developed, well nourished Michael Singleton in no acute distress. Head: Normocephalic, atraumatic, sclera non-icteric, no xanthomas, nares are without discharge.  Neck: Negative for carotid bruits. JVD not elevated. Old surgical changes of R neck. Lungs: Clear bilaterally to auscultation without wheezes, rales, or rhonchi. Breathing is unlabored. Heart: Regular with ectopy close to initial native beat so sounds bradycardic, with  S1 S2. No murmurs, rubs, or gallops appreciated. Abdomen: Soft, non-tender, non-distended with normoactive bowel sounds. No hepatomegaly. No rebound/guarding. No obvious  abdominal masses. Msk:  Strength and tone appear normal for age. Extremities: No clubbing or cyanosis. No edema.  Distal pedal pulses are 2+ and equal bilaterally. Neuro: Alert and oriented X 3. No focal deficit. No facial asymmetry. Moves all extremities spontaneously. Psych:  Responds to questions appropriately with a normal affect.    ASSESSMENT AND PLAN:  1. Ventricular bigeminy with pseudobradycardia - likely causing dizziness due to decreased effective cardiac output. When PVCs had resolved last admission, it appears that HR was 50s-70s so may be able to initiate beta blocker with careful attention to HR. Add Mg. Otherwise labs OK. If no improvement, consider referral to EP for consideration of PVC ablation or other antiarrhythmic. 2. Epigastric pain - cycle enzymes. No acute EKG changes and troponin WNL. Follow for recurrence. Hold off on full-dose heparin unless + enz or ischemic EKG changes. Check P2Y12. 3. CAD (remote cardiac arrest, MI 1990's s/p stenting, recent DES x2 to ostial & mid RCA) - continue ASA, statin, Plavix. Adding BB as above.  4. Hypothyroidism with recently abnormal TFTs - levothyroxine was increased appropriately last week. Too early to recheck. F/u TFTs the 2nd week of Sept.  5. Headache - ER has ordered head CT. Nonfocal exam. May be feeling poor due to #1. Symptomatic rx. 6. Carotid artery disease - fiance is concerned because Michael Singleton apparently had a test at the [place] showing resolution of carotid disease. Prior dopplers in our system consistently with 60-79% bilateral stenosis. Reasonable to repeat in setting of dizziness. 7. Untreated obstructive sleep apnea - counseled on importance of f/u to begin CPAP.   Signed, Ronie Spies PA-C 08/12/2013, 3:14 PM  I have personally seen and examined this patient with Ronie Spies, PA-C. I agree with the assessment and plan as outlined above. Michael Singleton is presenting with dizziness and fatigue. 2 DES placed in RCA last week. His presentation is  not c/w stent thrombosis (negative troponin, normal EKG) nor does Michael Singleton have chest pain. Michael Singleton does have bigeminy which could be contributing to his symptoms. Will initiate low dose beta blocker. Will arrange carotid artery dopplers. Head CT pending. Cycle cardiac markers. I do not think a repeat cath is indicated unless there is objective evidence of ischemia. Michael Singleton also has hypothyroidism and his synthroid was recently increased.   Mavin Dyke 08/12/2013 4:22 PM

## 2013-08-13 ENCOUNTER — Encounter: Payer: Medicare Other | Admitting: Physician Assistant

## 2013-08-13 DIAGNOSIS — R42 Dizziness and giddiness: Secondary | ICD-10-CM | POA: Diagnosis not present

## 2013-08-13 DIAGNOSIS — R5381 Other malaise: Secondary | ICD-10-CM | POA: Diagnosis not present

## 2013-08-13 DIAGNOSIS — I2510 Atherosclerotic heart disease of native coronary artery without angina pectoris: Secondary | ICD-10-CM | POA: Diagnosis not present

## 2013-08-13 DIAGNOSIS — G4733 Obstructive sleep apnea (adult) (pediatric): Secondary | ICD-10-CM | POA: Diagnosis not present

## 2013-08-13 DIAGNOSIS — I6529 Occlusion and stenosis of unspecified carotid artery: Secondary | ICD-10-CM

## 2013-08-13 LAB — TROPONIN I
Troponin I: <0.3 ng/mL (ref <0.30)
Troponin I: <0.3 ng/mL (ref <0.30)

## 2013-08-13 LAB — CBC
HCT: 38.9 % — ABNORMAL LOW (ref 39.0–52.0)
Hemoglobin: 13.3 g/dL (ref 13.0–17.0)
MCHC: 34.2 g/dL (ref 30.0–36.0)
RBC: 4.44 MIL/uL (ref 4.22–5.81)
WBC: 6.8 10*3/uL (ref 4.0–10.5)

## 2013-08-13 LAB — BASIC METABOLIC PANEL
Chloride: 103 mEq/L (ref 96–112)
GFR calc Af Amer: >90 mL/min (ref >90)
GFR calc non Af Amer: 78 mL/min — ABNORMAL LOW (ref >90)
Potassium: 4.2 mEq/L (ref 3.5–5.1)
Sodium: 140 mEq/L (ref 135–145)

## 2013-08-13 MED ORDER — PANTOPRAZOLE SODIUM 40 MG PO TBEC
40.0000 mg | DELAYED_RELEASE_TABLET | Freq: Every day | ORAL | Status: DC
  Filled 2013-08-13: qty 1

## 2013-08-13 NOTE — Progress Notes (Signed)
SUBJECTIVE: Feels better this am. No dizziness. No chest pain. No SOB  Tele: Sinus brady, rate 55-60   BP 112/57  Pulse 54  Temp(Src) 97.5 F (36.4 C) (Oral)  Resp 18  Ht 5\' 11"  (1.803 m)  Wt 265 lb 3.4 oz (120.3 kg)  BMI 37.01 kg/m2  SpO2 97% No intake or output data in the 24 hours ending 08/13/13 0701  PHYSICAL EXAM General: Well developed, well nourished, in no acute distress. Alert and oriented x 3.  Psych:  Good affect, responds appropriately Neck: No JVD. No masses noted.  Lungs: Clear bilaterally with no wheezes or rhonci noted.  Heart: Regular brady with no murmurs noted. Abdomen: Bowel sounds are present. Soft, non-tender.  Extremities: No lower extremity edema.   LABS: Basic Metabolic Panel:  Recent Labs  16/10/96 1324 08/12/13 2219 08/13/13 0400  NA 138  --  140  K 4.3  --  4.2  CL 103  --  103  CO2 25  --  28  GLUCOSE 92  --  86  BUN 11  --  13  CREATININE 0.89 0.95 1.04  CALCIUM 9.1  --  8.8  MG  --  2.5  --    CBC:  Recent Labs  08/12/13 1324 08/12/13 2219 08/13/13 0400  WBC 7.5 6.3 6.8  NEUTROABS 5.0  --   --   HGB 13.4 13.5 13.3  HCT 38.2* 38.1* 38.9*  MCV 86.2 86.2 87.6  PLT 196 196 204   Cardiac Enzymes:  Recent Labs  08/12/13 1757 08/12/13 2219 08/13/13 0400  TROPONINI <0.30 <0.30 <0.30   Current Meds: . aspirin  324 mg Oral Once  . aspirin  81 mg Oral Daily  . atorvastatin  80 mg Oral Daily  . buPROPion  300 mg Oral Daily  . citalopram  40 mg Oral Daily  . clopidogrel  75 mg Oral Daily  . gabapentin  300 mg Oral TID  . heparin  5,000 Units Subcutaneous Q8H  . levothyroxine  150 mcg Oral QAC breakfast  . metoprolol tartrate  12.5 mg Oral BID  . multivitamin with minerals  1 tablet Oral Daily  . sodium chloride  3 mL Intravenous Q12H  . traZODone  200 mg Oral QHS  . vitamin E  400 Units Oral Daily   Head CT: 08/12/13: The brainstem, cerebellum, cerebral peduncles, thalamus, basal  ganglia, basilar cisterns, and  ventricular system appear within  normal limits.  Paranasal sinuses clear where visualized. Middle ears appear clear.  No intracranial hemorrhage, mass lesion, or acute CVA.  IMPRESSION:  1. No significant abnormality identified.  ASSESSMENT AND PLAN:   1. Ventricular bigeminy with pseudobradycardia: Likely causing dizziness due to decreased effective cardiac output. Continue low dose beta blocker. This was started on admission and he seems to be tolerating.  If no improvement, consider referral to EP for consideration of PVC ablation or other antiarrhythmic.   2. Epigastric pain: No objective evidence of ischemia. Cardiac enzymes are negative x3.  No acute EKG changes. I do not think this is related to his CAD or recent stents. Will start PPI.    3. CAD:  (remote cardiac arrest, MI 1990's s/p stenting, recent DES x2 to ostial & mid RCA) - continue ASA, statin, Plavix, beta blocker.    4. Hypothyroidism with recently abnormal TFTs: This may also be contributing to his dizziness and fatigue.  Levothyroxine was increased appropriately last week. Too early to recheck. F/u TFTs the 2nd week  of Sept.   5. Headache: Head CT normal 8/201/4  6. Carotid artery disease: Prior dopplers in our system consistently with 60-79% bilateral stenosis. Will repeat carotid artery dopplers today with recent dizziness.   7. Obstructive sleep apnea: counseled on importance of f/u to begin CPAP.    MCALHANY,CHRISTOPHER  8/21/20147:01 AM

## 2013-08-13 NOTE — Progress Notes (Signed)
*  PRELIMINARY RESULTS* Vascular Ultrasound Carotid Duplex (Doppler) has been completed.  Preliminary findings: Right = 60-79% ICA stenosis. Left = 40-59% ICA stenosis.  Antegrade vertebral flow.   Farrel Demark, RDMS, RVT  08/13/2013, 2:44 PM

## 2013-08-13 NOTE — Progress Notes (Signed)
UR Completed Joelene Barriere Graves-Bigelow, RN,BSN [phone]  

## 2013-08-13 NOTE — Care Management Note (Unsigned)
    Page 1 of 1   08/13/2013     3:42:51 PM   CARE MANAGEMENT NOTE 08/13/2013  Patient:  Michael Singleton,Michael Singleton   Account Number:  [id number]  Date Initiated:  08/13/2013  Documentation initiated by:  GRAVES-BIGELOW,Lamisha Roussell  Subjective/Objective Assessment:   Pt admitted for dizziness and fatigue with ventricular bigeminy.     Action/Plan:   [facility]- per girlfriend pt is from [place]. However [facility]. CM did call [facility] fee basis at [phone] to make them aware that pt is here. CM will continue to monitor for needs.   Anticipated DC Date:  08/14/2013   Anticipated DC Plan:  HOME/SELF CARE         Choice offered to / List presented to:             Status of service:  In process, will continue to follow Medicare Important Message given?   (If response is "NO", the following Medicare IM given date fields will be blank) Date Medicare IM given:   Date Additional Medicare IM given:    Discharge Disposition:    Per UR Regulation:  Reviewed for med. necessity/level of care/duration of stay  If discussed at Long Length of Stay Meetings, dates discussed:    Comments:

## 2013-08-14 ENCOUNTER — Encounter (HOSPITAL_COMMUNITY): Payer: Self-pay | Admitting: Nurse Practitioner

## 2013-08-14 DIAGNOSIS — G4733 Obstructive sleep apnea (adult) (pediatric): Secondary | ICD-10-CM | POA: Diagnosis not present

## 2013-08-14 DIAGNOSIS — R42 Dizziness and giddiness: Secondary | ICD-10-CM | POA: Diagnosis not present

## 2013-08-14 DIAGNOSIS — R5381 Other malaise: Secondary | ICD-10-CM | POA: Diagnosis not present

## 2013-08-14 DIAGNOSIS — I2510 Atherosclerotic heart disease of native coronary artery without angina pectoris: Secondary | ICD-10-CM | POA: Diagnosis not present

## 2013-08-14 LAB — BASIC METABOLIC PANEL
BUN: 14 mg/dL (ref 6–23)
Chloride: 104 mEq/L (ref 96–112)
GFR calc non Af Amer: 68 mL/min — ABNORMAL LOW (ref >90)
Glucose, Bld: 82 mg/dL (ref 70–99)
Potassium: 4.3 mEq/L (ref 3.5–5.1)
Sodium: 142 mEq/L (ref 135–145)

## 2013-08-14 MED ORDER — METOPROLOL TARTRATE 25 MG PO TABS: 12.5000 mg | ORAL_TABLET | Freq: Two times a day (BID) | ORAL | Status: DC

## 2013-08-14 MED ORDER — PANTOPRAZOLE SODIUM 40 MG PO TBEC: 40.0000 mg | DELAYED_RELEASE_TABLET | Freq: Every day | ORAL | Status: AC

## 2013-08-14 NOTE — Progress Notes (Signed)
Pt discharged to home per MD order. Pt received and reviewed all discharge instructions and medication information including follow-up appointments and prescription information. Pt verbalized understanding. Pt alert and oriented at discharge with no complaints of pain. Pt offered wheelchair at discharge. Pt ambulated to private vehicle per pt request at discharge. Michael Singleton

## 2013-08-14 NOTE — Progress Notes (Signed)
SUBJECTIVE: No chest pain, SOB. No dizziness. Feels well. Ambulated yesterday without any problems.   BP 97/57  Pulse 57  Temp(Src) 97.9 F (36.6 C) (Oral)  Resp 18  Ht 5\' 11"  (1.803 m)  Wt 264 lb 9.6 oz (120.022 kg)  BMI 36.92 kg/m2  SpO2 97%  Intake/Output Summary (Last 24 hours) at 08/14/13 0708 Last data filed at 08/13/13 1200  Gross per 24 hour  Intake    720 ml  Output      0 ml  Net    720 ml    PHYSICAL EXAM General: Well developed, well nourished, in no acute distress. Alert and oriented x 3.  Psych:  Good affect, responds appropriately Neck: No JVD. No masses noted.  Lungs: Clear bilaterally with no wheezes or rhonci noted.  Heart: RRR with no murmurs noted. Abdomen: Bowel sounds are present. Soft, non-tender.  Extremities: No lower extremity edema.   LABS: Basic Metabolic Panel:  Recent Labs  04/54/09 1324 08/12/13 2219 08/13/13 0400  NA 138  --  140  K 4.3  --  4.2  CL 103  --  103  CO2 25  --  28  GLUCOSE 92  --  86  BUN 11  --  13  CREATININE 0.89 0.95 1.04  CALCIUM 9.1  --  8.8  MG  --  2.5  --    CBC:  Recent Labs  08/12/13 1324 08/12/13 2219 08/13/13 0400  WBC 7.5 6.3 6.8  NEUTROABS 5.0  --   --   HGB 13.4 13.5 13.3  HCT 38.2* 38.1* 38.9*  MCV 86.2 86.2 87.6  PLT 196 196 204   Cardiac Enzymes:  Recent Labs  08/12/13 1757 08/12/13 2219 08/13/13 0400  TROPONINI <0.30 <0.30 <0.30   Current Meds: . aspirin  324 mg Oral Once  . aspirin  81 mg Oral Daily  . atorvastatin  80 mg Oral Daily  . buPROPion  300 mg Oral Daily  . citalopram  40 mg Oral Daily  . clopidogrel  75 mg Oral Daily  . gabapentin  300 mg Oral TID  . heparin  5,000 Units Subcutaneous Q8H  . levothyroxine  150 mcg Oral QAC breakfast  . metoprolol tartrate  12.5 mg Oral BID  . multivitamin with minerals  1 tablet Oral Daily  . pantoprazole  40 mg Oral Daily  . sodium chloride  3 mL Intravenous Q12H  . traZODone  200 mg Oral QHS  . vitamin E  400 Units  Oral Daily   Carotid artery dopplers: Right = 60-79% ICA stenosis. Left = 40-59% ICA stenosis.  Antegrade vertebral flow.   ASSESSMENT AND PLAN: 57 yo male with CAD with recent stent placement x 2 RCA admitted with dizziness, fatigue, epigastric burning and found to have ventricular bigeminy.   1. Ventricular bigeminy with pseudobradycardia: Likely causing dizziness due to decreased effective cardiac output. Continue low dose beta blocker. This was started on admission and he seems to be tolerating.  2. Epigastric pain: No objective evidence of ischemia. Cardiac enzymes are negative x3. No acute EKG changes. I do not think this is related to his CAD or recent stents. PPI started this admission.   3. 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 PRU is 150 on Plavix demonstrating excellent platelet inhibition.   4. Hypothyroidism with recently abnormal TFTs: This may also be contributing to his dizziness  and fatigue. Levothyroxine was increased appropriately last week. Too early to recheck. F/u TFTs the 2nd week of Sept.   5. Headache: Head CT normal 8/201/4   6. Carotid artery disease: Dopplers yesterday with 60-79% RICA stenosis, 40-59% LICA stenosis. This is stable when compared to prior dopplers in our system. Not likely contributing to his dizziness prior to admission.   7. Obstructive sleep apnea: counseled on importance of f/u to begin CPAP.   8. Dispo: d/c home today if BMET ok. He needs f/u in our office in 2-3 weeks. New meds are metoprolol and Protonix.       Michael Singleton  8/22/20147:08 AM

## 2013-08-14 NOTE — Discharge Summary (Signed)
Patient ID: Michael Singleton,  MRN: [id number], DOB/AGE: 02-Oct-1956 57 y.o.  Admit date: 08/12/2013 Discharge date: 08/14/2013  Primary Care Provider: Illene Regulus Primary Cardiologist: C. Clifton James, MD / [facility]  Discharge Diagnoses Principal Problem:   Ventricular bigeminy  **low-dose beta blocker initiated this admission. Active Problems:   HYPOTHYROIDISM   CAD (coronary artery disease)   Dizziness   Fatigue   HYPERLIPIDEMIA   HYPERTENSION   OBSTRUCTIVE SLEEP APNEA   Carotid arterial disease  **stable 60-79% RICA stenosis & 40-59% LICA stenosis on ultrasound this admission.  Allergies Allergies  Allergen Reactions  . Fish Allergy Nausea And Vomiting  . Shellfish Allergy Nausea And Vomiting   Procedures  Carotid Ultrasound 8.21.2014  60-79% RICA stenosis & 40-59% LICA stenosis. _____________  History of Present Illness  57 y/o male with h/o CAD s/p prior MI and stenting with recent RCA stenting on 08/05/2013.  He also has a h/o hypothyroidism and his synthroid dose was adjusted during hospitalization in mid-August secondary to a TSH of 33.692.  Upon discharge, he planned to f/u at the [facility].  Following discharge, patient initially did well however, over a 3 day period prior to admission, he began to experience intermittent dizziness and fatigue.  On 8/20, his fiance checked his pulse and found it to be in the 30's.  As a result, EMS was called and upon their arrival, patient was found to have ventricular bigeminy with HR's in the 60's.  He was taken to the [facility] ED where ECG was non-acute and initial troponin was normal.  He also reported a headache and a CT of the head was performed and was non-acute.  He was admitted for further evaluation.  Hospital Course  Following admission, Michael Singleton was placed on low-dose beta blocker therapy.  He ruled out for MI.  He had no further dizziness and less PVC burden.  He tolerated beta blocker well and has been  ambulating without difficulty.  He will be discharged home today in good condition.  Though he previously followed up at the [facility], he has now requested f/u in our office and this has been arranged.  Discharge Vitals Blood pressure 97/57, pulse 57, temperature 97.9 F (36.6 C), temperature source Oral, resp. rate 18, height 5\' 11"  (1.803 m), weight 264 lb 9.6 oz (120.022 kg), SpO2 97.00%.  Filed Weights   08/12/13 1745 08/14/13 0539  Weight: 265 lb 3.4 oz (120.3 kg) 264 lb 9.6 oz (120.022 kg)   Labs  CBC  Recent Labs  08/12/13 1324 08/12/13 2219 08/13/13 0400  WBC 7.5 6.3 6.8  NEUTROABS 5.0  --   --   HGB 13.4 13.5 13.3  HCT 38.2* 38.1* 38.9*  MCV 86.2 86.2 87.6  PLT 196 196 204   Basic Metabolic Panel  Recent Labs  08/12/13 2219 08/13/13 0400 08/14/13 0530  NA  --  140 142  K  --  4.2 4.3  CL  --  103 104  CO2  --  28 30  GLUCOSE  --  86 82  BUN  --  13 14  CREATININE 0.95 1.04 1.16  CALCIUM  --  8.8 9.1  MG 2.5  --   --    Cardiac Enzymes  Recent Labs  08/12/13 1757 08/12/13 2219 08/13/13 0400  TROPONINI <0.30 <0.30 <0.30   Disposition  Pt is being discharged home today in good condition.  Follow-up Plans & Appointments  Follow-up Information   Follow up with [facility]  Alben Spittle, PA-C On 09/02/2013. (8:50 AM)    Specialty:  Physician Assistant   Contact information:   [address] [place] [address] [phone]      Discharge Medications    Medication List         aspirin 81 MG tablet  Take 1 tablet (81 mg total) by mouth daily.     atorvastatin 80 MG tablet  Commonly known as:  LIPITOR  Take 1 tablet (80 mg total) by mouth daily.     buPROPion 300 MG 24 hr tablet  Commonly known as:  WELLBUTRIN XL  Take 300 mg by mouth daily.     citalopram 40 MG tablet  Commonly known as:  CELEXA  Take 40 mg by mouth daily.     clopidogrel 75 MG tablet  Commonly known as:  PLAVIX  Take 1 tablet (75 mg total) by  mouth daily with breakfast.     gabapentin 300 MG capsule  Commonly known as:  NEURONTIN  Take 300 mg by mouth 3 (three) times daily.     levothyroxine 150 MCG tablet  Commonly known as:  SYNTHROID, LEVOTHROID  Take 1 tablet (150 mcg total) by mouth daily before breakfast.     metoprolol tartrate 25 MG tablet  Commonly known as:  LOPRESSOR  Take 0.5 tablets (12.5 mg total) by mouth 2 (two) times daily.     multivitamin with minerals Tabs tablet  Take 1 tablet by mouth daily.     nitroGLYCERIN 0.4 MG SL tablet  Commonly known as:  NITROSTAT  Place 1 tablet (0.4 mg total) under the tongue every 5 (five) minutes x 3 doses as needed for chest pain.     pantoprazole 40 MG tablet  Commonly known as:  PROTONIX  Take 1 tablet (40 mg total) by mouth daily.     PRESCRIPTION MEDICATION  Apply 1 application topically at bedtime. Cream from [place] for dermatitis     traZODone 100 MG tablet  Commonly known as:  DESYREL  Take 200 mg by mouth at bedtime.     vitamin E 400 UNIT capsule  Take 400 Units by mouth daily.       Outstanding Labs/Studies  Follow-up TFT's in 4-6 wks.  Duration of Discharge Encounter   Greater than 30 minutes including physician time.  Signed, Nicolasa Ducking NP 08/14/2013, 9:01 AM

## 2013-08-14 NOTE — Discharge Summary (Signed)
See full note this am. cdm 

## 2013-08-16 NOTE — ED Provider Notes (Signed)
Medical screening examination/treatment/procedure(s) were conducted as a shared visit with non-physician practitioner(s) and myself.  I personally evaluated the patient during the encounter  CAD with stent placement 1 week ago presenting with dizziness, lightheadedness, nausea, similar to prior to stent placement. Denies chest pain.  CTAB, rrr.  ekg unchanged. D/w cardiology.  Glynn Octave, MD 08/16/13 1606

## 2013-09-02 ENCOUNTER — Encounter: Payer: Self-pay | Admitting: Physician Assistant

## 2013-09-02 ENCOUNTER — Telehealth: Payer: Self-pay | Admitting: Hematology and Oncology

## 2013-09-02 ENCOUNTER — Ambulatory Visit (INDEPENDENT_AMBULATORY_CARE_PROVIDER_SITE_OTHER): Payer: Medicare Other | Admitting: Physician Assistant

## 2013-09-02 VITALS — BP 110/64 | HR 50 | Ht 71.0 in | Wt 264.0 lb

## 2013-09-02 DIAGNOSIS — I6529 Occlusion and stenosis of unspecified carotid artery: Secondary | ICD-10-CM | POA: Diagnosis not present

## 2013-09-02 DIAGNOSIS — I10 Essential (primary) hypertension: Secondary | ICD-10-CM

## 2013-09-02 DIAGNOSIS — I2510 Atherosclerotic heart disease of native coronary artery without angina pectoris: Secondary | ICD-10-CM | POA: Diagnosis not present

## 2013-09-02 DIAGNOSIS — I498 Other specified cardiac arrhythmias: Secondary | ICD-10-CM

## 2013-09-02 DIAGNOSIS — E785 Hyperlipidemia, unspecified: Secondary | ICD-10-CM | POA: Diagnosis not present

## 2013-09-02 NOTE — Patient Instructions (Addendum)
Your physician recommends that you return for lab work in: LIPIDS, LFT'S IN 4-6 WEEKS   Your physician recommends that you schedule a follow-up appointment in: 3 MONTHS WITH DR. Clifton James  Your physician recommends that you continue on your current medications as directed. Please refer to the Current Medication list given to you today.

## 2013-09-02 NOTE — Telephone Encounter (Signed)
Returned call to Michael Singleton pt's fiance re r/s 10/31 appt. Per Michael Singleton [facility]. Michael Singleton given new appts for lb/NG 10/20 @ 12pm.

## 2013-09-02 NOTE — Progress Notes (Signed)
[address] [place], [place]  [address] Phone: [phone] Fax:  [phone]  Date:  09/02/2013   ID:  Michael Singleton, DOB 1956-08-23, MRN [id number]  PCP:  Illene Regulus, MD  Cardiologist:  Dr. Augustin [facility]ing [facility]    History of Present Illness: Michael Singleton is a 57 y.o. male who returns for follow up after 2 recent admissions to the hospital.  Prior cardiac care has been through the [facility] in [place].  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  He was admitted 8/12-8/14 with a 1 month hx of CP, palps, fatigue with assoc dyspnea and diaphoresis.  He was noted to be in Ventricular Bigeminy by EMS.  CEs remained negative.  Echo 08/05/13:  inferobasal HK, EF 50-55%, mild LAE.  LHC 08/05/13:  prox-mid LAD 50, oRCA 99, mRCA stent ok, mRCA 99 beyond stent, EF 55%, inf HK.  PCI:  Promus Premier DES to [employer] and [employer] DES to [employer].  TSH was markedly elevated at 33.69.  Levothyroxine dose was adjusted.  He was then readmitted 8/20-8/22 with intermittent dizziness and fatigue and HR in the 30s.  ECG with EMS demonstrated ventricular bigeminy with HR in 60s.  CT of head (for HA) was negative.  CEs remained normal.  He was placed on low dose beta blocker with improvement in PVC burden.  Carotid US 08/13/13:  RICA 60-79%, LICA 40-59%.   He is doing well.  Feels better than he has in a long time.  The patient denies chest pain, shortness of breath, syncope, orthopnea, PND or significant pedal edema.   Labs (8/14):  K 4.3, Cr 1.16, ALT 16, HDL 31, LDL 188, Hgb 13.3, TSH 33.692, T4 0.77,    Wt Readings from Last 3 Encounters:  09/02/13 264 lb (119.75 kg)  08/14/13 264 lb 9.6 oz (120.022 kg)  08/06/13 272 lb 0.8 oz (123.4 kg)     Past Medical History  Diagnosis Date  . Inguinal hernia, left   . Lumbar disc disease     a. with chronic LBP  .  Carotid artery disease     a. Dopplers 01/2012: 60-79% bilat carotid dz;  b. 07/2013 60-79% RICA stenosis & 40-59% LICA stenosis.  . Allergic rhinitis, cause unspecified   . Lumbago   . Male infertility, unspecified   . Posttraumatic stress disorder   . Diverticulosis of colon (without mention of hemorrhage)   . Unspecified hypothyroidism   . Unspecified essential hypertension   . Hyperlipemia   . CAD (coronary artery disease)     a. history of sudden cardiac arrest, s/p MI's in 1995 and 1997 with prior stenting;  b. 07/2013 Cath/PCI: LM nl, LAD 50p/m, D1 nl, LCX nl, RI nl, RCA dominant, 99ost (3.0x20 Promus Premier DES)/33m(3.0x12 Promus Premier DES), EF 55% w/ inf HK.  . Closed TBI (traumatic brain injury) 1974    "memory issues since" (08/04/2013)  . Heart murmur   . OSA (obstructive sleep apnea)     a. not using CPAP.  . Migraines     "maybe once or twice/yr" (08/04/2013)  . Arthritis     "hands, neck, back, hips" (08/04/2013)  . Anxiety   . Depression   . Diffuse large B cell lymphoma     a. s/p radical neck dissection. b. followed once a year - last check 09/2012 was OK.  . Ventricular bigeminy  a. pseudobradycardia 07/2013 - bigeminy tends to register low on HR monitors.    Current Outpatient Prescriptions  Medication Sig Dispense Refill  . aspirin 81 MG tablet Take 1 tablet (81 mg total) by mouth daily.  30 tablet    . atorvastatin (LIPITOR) 80 MG tablet Take 1 tablet (80 mg total) by mouth daily.  30 tablet  6  . buPROPion (WELLBUTRIN XL) 300 MG 24 hr tablet Take 300 mg by mouth daily.      . citalopram (CELEXA) 40 MG tablet Take 40 mg by mouth daily.      . clopidogrel (PLAVIX) 75 MG tablet Take 1 tablet (75 mg total) by mouth daily with breakfast.  30 tablet  6  . gabapentin (NEURONTIN) 300 MG capsule Take 300 mg by mouth 3 (three) times daily.      Marland Kitchen levothyroxine (SYNTHROID, LEVOTHROID) 150 MCG tablet Take 1 tablet (150 mcg total) by mouth daily before breakfast.  30  tablet  2  . metoprolol tartrate (LOPRESSOR) 25 MG tablet Take 0.5 tablets (12.5 mg total) by mouth 2 (two) times daily.  30 tablet  6  . Multiple Vitamin (MULTIVITAMIN WITH MINERALS) TABS tablet Take 1 tablet by mouth daily.      . nitroGLYCERIN (NITROSTAT) 0.4 MG SL tablet Place 1 tablet (0.4 mg total) under the tongue every 5 (five) minutes x 3 doses as needed for chest pain.  25 tablet  3  . pantoprazole (PROTONIX) 40 MG tablet Take 1 tablet (40 mg total) by mouth daily.  30 tablet  6  . PRESCRIPTION MEDICATION Apply 1 application topically at bedtime. Cream from [place] for dermatitis      . traZODone (DESYREL) 100 MG tablet Take 200 mg by mouth at bedtime.      . vitamin E 400 UNIT capsule Take 400 Units by mouth daily.       No current facility-administered medications for this visit.    Allergies:    Allergies  Allergen Reactions  . Fish Allergy Nausea And Vomiting  . Shellfish Allergy Nausea And Vomiting    Social History:  The patient  reports that he quit smoking about 9 years ago. His smoking use included Cigarettes. He has a 52.5 pack-year smoking history. He has never used smokeless tobacco. He reports that  drinks alcohol. He reports that he does not use illicit drugs.   ROS:  Please see the history of present illness.      All other systems reviewed and negative.   PHYSICAL EXAM: VS:  BP 110/64  Pulse 50  Ht 5\' 11"  (1.803 m)  Wt 264 lb (119.75 kg)  BMI 36.84 kg/m2 Well nourished, well developed, in no acute distress HEENT: normal Neck: no JVD Cardiac:  normal S1, S2; RRR; no murmur Lungs:  clear to auscultation bilaterally, no wheezing, rhonchi or rales Abd: soft, nontender, no hepatomegaly Ext: no edema; right wrist without hematoma or mass; right groin without hematoma or bruit  Skin: warm and dry Neuro:  CNs 2-12 intact, no focal abnormalities noted  EKG:  Sinus bradycardia, HR 50, normal axis, nonspecific ST-T wave changes     ASSESSMENT AND PLAN:  1. CAD:   Doing well s/p PCI to the RCA x 2 for [place].  He is not interested in cardiac rehab.  I have asked him to gradually increase his activity.  We discussed the importance of dual antiplatelet therapy.  Continue high dose statin.  2. PVCs:  Quiescent on low dose beta blocker.  Continue current Rx.  3. Hypothyroidism:  F/u with PCP for management.  4. Hyperlipidemia:  Continue Lipitor 80.  Check Lipids and LFTs in 6 weeks.   5. Hypertension:  Controlled.  Continue current therapy.  6. Carotid Stenosis:  He will need f/u Carotid US in 1 year.  Of note, we will need to give him a Rx to take to the [place] to have this done there. 7. Disposition:  F/u with Dr. Verne Carrow in 3 mos.   Signed, Tereso Newcomer, PA-C  09/02/2013 9:16 AM

## 2013-09-30 ENCOUNTER — Other Ambulatory Visit: Payer: Non-veteran care

## 2013-10-09 ENCOUNTER — Other Ambulatory Visit: Payer: Self-pay | Admitting: Hematology and Oncology

## 2013-10-09 ENCOUNTER — Encounter: Payer: Self-pay | Admitting: Hematology and Oncology

## 2013-10-09 DIAGNOSIS — C8590 Non-Hodgkin lymphoma, unspecified, unspecified site: Secondary | ICD-10-CM

## 2013-10-09 HISTORY — DX: Non-Hodgkin lymphoma, unspecified, unspecified site: C85.90

## 2013-10-12 ENCOUNTER — Telehealth: Payer: Self-pay | Admitting: Hematology and Oncology

## 2013-10-12 ENCOUNTER — Ambulatory Visit (HOSPITAL_BASED_OUTPATIENT_CLINIC_OR_DEPARTMENT_OTHER): Payer: Medicare Other | Admitting: Hematology and Oncology

## 2013-10-12 ENCOUNTER — Encounter: Payer: Self-pay | Admitting: Hematology and Oncology

## 2013-10-12 ENCOUNTER — Other Ambulatory Visit (HOSPITAL_BASED_OUTPATIENT_CLINIC_OR_DEPARTMENT_OTHER): Payer: Medicare Other | Admitting: Lab

## 2013-10-12 VITALS — BP 129/71 | HR 48 | Temp 97.0°F | Resp 20 | Ht 71.0 in | Wt 272.9 lb

## 2013-10-12 DIAGNOSIS — C8581 Other specified types of non-Hodgkin lymphoma, lymph nodes of head, face, and neck: Secondary | ICD-10-CM

## 2013-10-12 DIAGNOSIS — D649 Anemia, unspecified: Secondary | ICD-10-CM | POA: Diagnosis not present

## 2013-10-12 DIAGNOSIS — C8590 Non-Hodgkin lymphoma, unspecified, unspecified site: Secondary | ICD-10-CM

## 2013-10-12 DIAGNOSIS — E039 Hypothyroidism, unspecified: Secondary | ICD-10-CM

## 2013-10-12 DIAGNOSIS — E559 Vitamin D deficiency, unspecified: Secondary | ICD-10-CM

## 2013-10-12 HISTORY — DX: Vitamin D deficiency, unspecified: E55.9

## 2013-10-12 LAB — CBC WITH DIFFERENTIAL/PLATELET
Basophils Absolute: 0 10*3/uL (ref 0.0–0.1)
Eosinophils Absolute: 0.1 10*3/uL (ref 0.0–0.5)
HCT: 35.2 % — ABNORMAL LOW (ref 38.4–49.9)
HGB: 11.9 g/dL — ABNORMAL LOW (ref 13.0–17.1)
MCV: 87.8 fL (ref 79.3–98.0)
MONO%: 11.2 % (ref 0.0–14.0)
NEUT#: 3.7 10*3/uL (ref 1.5–6.5)
NEUT%: 62.7 % (ref 39.0–75.0)
RDW: 13.8 % (ref 11.0–14.6)
lymph#: 1.4 10*3/uL (ref 0.9–3.3)

## 2013-10-12 LAB — LACTATE DEHYDROGENASE (CC13): LDH: 169 U/L (ref 125–245)

## 2013-10-12 LAB — COMPREHENSIVE METABOLIC PANEL (CC13)
Albumin: 3.6 g/dL (ref 3.5–5.0)
BUN: 10.8 mg/dL (ref 7.0–26.0)
Calcium: 8.9 mg/dL (ref 8.4–10.4)
Chloride: 108 mEq/L (ref 98–109)
Creatinine: 0.8 mg/dL (ref 0.7–1.3)
Glucose: 100 mg/dl (ref 70–140)
Potassium: 4.4 mEq/L (ref 3.5–5.1)

## 2013-10-12 MED ORDER — VITAMIN D3 50 MCG (2000 UT) PO TABS: 2000.0000 [IU] | ORAL_TABLET | Freq: Every day | ORAL | Status: AC

## 2013-10-12 NOTE — Progress Notes (Signed)
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 According to the patient, he presented with lymphadenopathy in the neck. He was referred to ENT who had inconclusive workup from needle biopsy. Subsequently he underwent a radical neck dissection which confirmed the diagnosis of diffuse large B-cell lymphoma. He received 2 cycles of R. CHOP chemotherapy and consolidation radiation therapy. The patient achieved complete response and then was just observed  INTERVAL HISTORY: Michael Singleton 57 y.o. male returns for further followup. He denies any swallowing difficulties. In August of 2014, the patient complained of dizziness and profuse sweating. He was subsequently found to have significant coronary artery disease requiring placement of 2 stents. After the surgery he was placed on aspirin and Plavix. He denies any further chest pain shortness of breath dizziness or lightheadedness. While on antiplatelet therapy, he denies any spontaneous bleeding such as epistaxis, hematuria, or hematochezia. He denies any recent fever, chills, night sweats or abnormal weight loss  I have reviewed the past medical history, past surgical history, social history and family history with the patient and they are unchanged from previous note.  ALLERGIES:  is allergic to fish allergy and shellfish allergy.  MEDICATIONS:  Current Outpatient Prescriptions  Medication Sig Dispense Refill  . aspirin 81 MG tablet Take 1 tablet (81 mg total) by mouth daily.  30 tablet    . atorvastatin (LIPITOR) 80 MG tablet Take 1 tablet (80 mg total) by mouth daily.  30 tablet  6  . buPROPion (WELLBUTRIN XL) 300 MG 24 hr tablet Take 300 mg by mouth daily.      . citalopram (CELEXA)  40 MG tablet Take 40 mg by mouth daily.      . clopidogrel (PLAVIX) 75 MG tablet Take 1 tablet (75 mg total) by mouth daily with breakfast.  30 tablet  6  . gabapentin (NEURONTIN) 300 MG capsule Take 300 mg by mouth 3 (three) times daily.      Marland Kitchen levothyroxine (SYNTHROID, LEVOTHROID) 150 MCG tablet Take 1 tablet (150 mcg total) by mouth daily before breakfast.  30 tablet  2  . metoprolol tartrate (LOPRESSOR) 25 MG tablet Take 0.5 tablets (12.5 mg total) by mouth 2 (two) times daily.  30 tablet  6  . Multiple Vitamin (MULTIVITAMIN WITH MINERALS) TABS tablet Take 1 tablet by mouth daily.      . nitroGLYCERIN (NITROSTAT) 0.4 MG SL tablet Place 1 tablet (0.4 mg total) under the tongue every 5 (five) minutes x 3 doses as needed for chest pain.  25 tablet  3  . pantoprazole (PROTONIX) 40 MG tablet Take 1 tablet (40 mg total) by mouth daily.  30 tablet  6  . PRESCRIPTION MEDICATION Apply 1 application topically at bedtime. Cream from [place] for dermatitis      . traZODone (DESYREL) 100 MG tablet Take 200 mg by mouth at bedtime.      . vitamin E 400 UNIT capsule Take 400 Units by mouth daily.      . cholecalciferol 2000 UNITS TABS Take 2,000 Units by mouth daily.  90 tablet  3   No current facility-administered medications for this visit.    REVIEW OF SYSTEMS:   Constitutional: Denies fevers, chills or abnormal weight loss Eyes: Denies blurriness of vision Ears,  nose, mouth, throat, and face: Denies mucositis or sore throat Respiratory: Denies cough, dyspnea or wheezes Cardiovascular: Denies palpitation, chest discomfort or lower extremity swelling Gastrointestinal:  Denies nausea, heartburn or change in bowel habits Skin: Denies abnormal skin rashes Lymphatics: Denies new lymphadenopathy or easy bruising Neurological:Denies numbness, tingling or new weaknesses Behavioral/Psych: Mood is stable, no new changes  All other systems were reviewed with the patient and are negative.  PHYSICAL  EXAMINATION: ECOG PERFORMANCE STATUS: 0 - Asymptomatic  Filed Vitals:   10/12/13 1211  BP: 129/71  Pulse: 48  Temp: 97 F (36.1 C)  Resp: 20   Filed Weights   10/12/13 1211  Weight: 272 lb 14.4 oz (123.787 kg)    GENERAL:alert, no distress and comfortable SKIN: skin color, texture, turgor are normal, no rashes or significant lesions EYES: normal, Conjunctiva are pink and non-injected, sclera clear OROPHARYNX:no exudate, no erythema and lips, buccal mucosa, and tongue normal  NECK: supple, thyroid normal size, non-tender, without nodularity. Noted evidence of neck deformity from prior surgery LYMPH:  no palpable lymphadenopathy in the cervical, axillary or inguinal LUNGS: clear to auscultation and percussion with normal breathing effort HEART: regular rate & rhythm and no murmurs and no lower extremity edema ABDOMEN:abdomen soft, non-tender and normal bowel sounds Musculoskeletal:no cyanosis of digits and no clubbing  NEURO: alert & oriented x 3 with fluent speech, no focal motor/sensory deficits  LABORATORY DATA:  I have reviewed the data as listed    Component Value Date/Time   NA 142 08/14/2013 0530   NA 140 04/11/2011 0944   K 4.3 08/14/2013 0530   K 4.3 04/11/2011 0944   CL 104 08/14/2013 0530   CL 99 04/11/2011 0944   CO2 30 08/14/2013 0530   CO2 26 04/11/2011 0944   GLUCOSE 82 08/14/2013 0530   GLUCOSE 92 04/11/2011 0944   BUN 14 08/14/2013 0530   BUN 7 04/11/2011 0944   CREATININE 1.16 08/14/2013 0530   CREATININE 1.0 04/11/2011 0944   CALCIUM 9.1 08/14/2013 0530   CALCIUM 8.8 04/11/2011 0944   PROT 6.7 08/05/2013 0300   PROT 7.0 04/11/2011 0944   ALBUMIN 3.9 08/05/2013 0300   AST 21 08/05/2013 0300   AST 29 04/11/2011 0944   ALT 16 08/05/2013 0300   ALT 32 04/11/2011 0944   ALKPHOS 74 08/05/2013 0300   ALKPHOS 68 04/11/2011 0944   BILITOT 0.4 08/05/2013 0300   BILITOT 0.50 04/11/2011 0944   GFRNONAA 68* 08/14/2013 0530   GFRAA 79* 08/14/2013 0530    No results found for this  basename: SPEP, UPEP,  kappa and lambda light chains    Lab Results  Component Value Date   WBC 5.9 10/12/2013   NEUTROABS 3.7 10/12/2013   HGB 11.9* 10/12/2013   HCT 35.2* 10/12/2013   MCV 87.8 10/12/2013   PLT 185 10/12/2013      Chemistry      Component Value Date/Time   NA 142 08/14/2013 0530   NA 140 04/11/2011 0944   K 4.3 08/14/2013 0530   K 4.3 04/11/2011 0944   CL 104 08/14/2013 0530   CL 99 04/11/2011 0944   CO2 30 08/14/2013 0530   CO2 26 04/11/2011 0944   BUN 14 08/14/2013 0530   BUN 7 04/11/2011 0944   CREATININE 1.16 08/14/2013 0530   CREATININE 1.0 04/11/2011 0944      Component Value Date/Time   CALCIUM 9.1 08/14/2013 0530   CALCIUM 8.8 04/11/2011 0944   ALKPHOS 74 08/05/2013 0300  ALKPHOS 68 04/11/2011 0944   AST 21 08/05/2013 0300   AST 29 04/11/2011 0944   ALT 16 08/05/2013 0300   ALT 32 04/11/2011 0944   BILITOT 0.4 08/05/2013 0300   BILITOT 0.50 04/11/2011 0944     RADIOGRAPHIC STUDIES: I have personally reviewed the radiological images as listed and agreed with the findings in the report. I have reviewed his most recent CT scan of the neck and chest abdomen and pelvis from the [place] which showed no evidence of recurrence of lymphoma  ASSESSMENT:  History of diffuse large B-cell lymphoma, no evidence of disease  PLAN:  #1 history of diffuse large B-cell lymphoma His evidence of disease. The patient is almost 3 years out from his last treatment. I recommend history, physical examination, and blood work in 6 months #2 mild anemia This is slightly lower than his baseline. The patient is currently on the wall antiplatelet agents. He did not report any signs of bleeding. He is asymptomatic. I recommend he follow with his primary physician at the [facility] clinic and have his hemoglobin we checked it again and of the year. If his blood counts continue to drop, then I think he would need GI evaluation to rule out GI bleed. #3 preventive care I recommend influenza vaccine and  pneumonia vaccine. The patient declined. #4 hypothyroidism The patient has gained a lot of weight. His most recent TSH is down to 1.5. I recommend periodic bloodwork checked on this as the patient is at risk for chronic hypothyroidism from exposure to radiation therapy to his neck.  Orders Placed This Encounter  Procedures  . Comprehensive metabolic panel    Standing Status: Future     Number of Occurrences:      Standing Expiration Date: 10/12/2014  . CBC with Differential    Standing Status: Future     Number of Occurrences:      Standing Expiration Date: 07/04/2014  . Lactate dehydrogenase    Standing Status: Future     Number of Occurrences:      Standing Expiration Date: 10/12/2014   All questions were answered. The patient knows to call the clinic with any problems, questions or concerns. No barriers to learning was detected. I spent 25 minutes counseling the patient face to face. The total time spent in the appointment was 40 minutes and more than 50% was on counseling and review of test results     [facility], Nimisha Rathel, MD 10/12/2013 12:42 PM

## 2013-10-12 NOTE — Telephone Encounter (Signed)
s.w pt and advised on April 2015 appt...he requested that I mail appt.Marland KitchenMarland KitchenMarland KitchenDone

## 2013-10-14 ENCOUNTER — Other Ambulatory Visit: Payer: Non-veteran care

## 2013-10-21 DIAGNOSIS — J019 Acute sinusitis, unspecified: Secondary | ICD-10-CM | POA: Diagnosis not present

## 2013-10-21 DIAGNOSIS — J029 Acute pharyngitis, unspecified: Secondary | ICD-10-CM | POA: Diagnosis not present

## 2013-10-23 ENCOUNTER — Ambulatory Visit: Payer: Medicare Other | Admitting: Oncology

## 2013-10-23 ENCOUNTER — Other Ambulatory Visit: Payer: Medicare Other | Admitting: Lab

## 2013-10-28 DIAGNOSIS — S61409A Unspecified open wound of unspecified hand, initial encounter: Secondary | ICD-10-CM | POA: Diagnosis not present

## 2013-10-29 ENCOUNTER — Other Ambulatory Visit: Payer: Self-pay

## 2013-11-18 ENCOUNTER — Encounter: Payer: Self-pay | Admitting: Internal Medicine

## 2013-12-02 ENCOUNTER — Telehealth: Payer: Self-pay

## 2013-12-02 NOTE — Telephone Encounter (Signed)
Phone call from [place] with [facility] [phone] states patient declines colo guard test due to previous cancer so will stick with colonoscopy's being done. Michael Singleton states the company needs to call the physician when test is declined.

## 2013-12-03 ENCOUNTER — Ambulatory Visit: Payer: Non-veteran care | Admitting: Cardiovascular Disease

## 2014-01-16 DIAGNOSIS — J018 Other acute sinusitis: Secondary | ICD-10-CM | POA: Diagnosis not present

## 2014-03-10 DIAGNOSIS — M75120 Complete rotator cuff tear or rupture of unspecified shoulder, not specified as traumatic: Secondary | ICD-10-CM | POA: Diagnosis not present

## 2014-03-16 ENCOUNTER — Other Ambulatory Visit: Payer: Self-pay | Admitting: Orthopedic Surgery

## 2014-03-16 DIAGNOSIS — M25511 Pain in right shoulder: Secondary | ICD-10-CM

## 2014-03-24 ENCOUNTER — Other Ambulatory Visit: Payer: Self-pay | Admitting: Family Medicine

## 2014-03-24 DIAGNOSIS — M25511 Pain in right shoulder: Secondary | ICD-10-CM

## 2014-03-31 ENCOUNTER — Ambulatory Visit
Admission: RE | Admit: 2014-03-31 | Discharge: 2014-03-31 | Disposition: A | Discharge status: Home / Self Care | Payer: Non-veteran care | Source: Ambulatory Visit | Attending: Orthopedic Surgery | Admitting: Orthopedic Surgery

## 2014-03-31 DIAGNOSIS — M25511 Pain in right shoulder: Secondary | ICD-10-CM

## 2014-04-12 ENCOUNTER — Ambulatory Visit: Payer: Non-veteran care | Admitting: Hematology and Oncology

## 2014-04-12 ENCOUNTER — Other Ambulatory Visit: Payer: Non-veteran care

## 2014-04-19 DIAGNOSIS — M75120 Complete rotator cuff tear or rupture of unspecified shoulder, not specified as traumatic: Secondary | ICD-10-CM | POA: Diagnosis not present

## 2014-07-28 DIAGNOSIS — J0100 Acute maxillary sinusitis, unspecified: Secondary | ICD-10-CM | POA: Diagnosis not present

## 2014-12-02 ENCOUNTER — Encounter (HOSPITAL_COMMUNITY): Payer: Self-pay | Admitting: Cardiovascular Disease

## 2015-08-03 ENCOUNTER — Encounter: Payer: Self-pay | Admitting: Gastroenterology

## 2016-02-22 ENCOUNTER — Encounter: Payer: Self-pay | Admitting: Gastroenterology

## 2016-05-04 HISTORY — PX: CARDIAC CATHETERIZATION: SHX172

## 2016-05-10 ENCOUNTER — Telehealth: Payer: Self-pay | Admitting: Cardiovascular Disease

## 2016-05-10 NOTE — Telephone Encounter (Signed)
We can add him onto Monday for me. cdm

## 2016-05-10 NOTE — Telephone Encounter (Signed)
I spoke with pt's wife and appt made for pt to see Dr. Angelena Form on May 22,2017 at 10:00.  I [facility] prior to appt. I told wife if pt's symptoms worsen prior to appt he should go to ED.

## 2016-05-10 NOTE — Telephone Encounter (Signed)
New Message  Pt wife calling to speak w/ RN (Pt last seen Sept/2014). Pt wife stated that per Dr Einar Gip- pt is supposed to have ASAP stint procedure- pt wife stated would rather have Dr Angelena Form do procedure. Pt wife requested to speak w/ RN. Please call back and discuss.

## 2016-05-10 NOTE — Telephone Encounter (Signed)
Received permission from pt to speak with his wife. Spoke with pt's wife who reports pt recently saw his cardiologist at [facility] (Dr. Raynelle Dick [phone]).  Cath was arranged and done at [facility] by Dr. Duke Salvia on 05/04/16.  Pt was told he had blockages in LAD and RCA and needed 2 stents.  Pt has option through [place] choice program to have procedure done outside the [place].  Wife reports she does not know when this could be done by [place] and was told by [place] it could be done faster outside the [place].  Wife would like Dr. Angelena Form to do procedure and is calling to see how to get this scheduled.  She reports she has contacted Dr. Raynelle Dick but she is out of the office today and may be contacting Dr. Angelena Form tomorrow.  Wife also states I could try to contact Dr. Raynelle Dick at number above tomorrow.  W[facility] states [facility] office and appt was made for pt to see Melina Copa, PA on June 23,2017 but pt needs earlier appt.

## 2016-05-14 ENCOUNTER — Encounter: Payer: Self-pay | Admitting: Cardiovascular Disease

## 2016-05-14 ENCOUNTER — Ambulatory Visit (INDEPENDENT_AMBULATORY_CARE_PROVIDER_SITE_OTHER): Payer: Medicare Other | Admitting: Cardiovascular Disease

## 2016-05-14 ENCOUNTER — Encounter: Payer: Self-pay | Admitting: *Deleted

## 2016-05-14 VITALS — BP 146/68 | HR 67 | Ht 71.0 in | Wt 268.8 lb

## 2016-05-14 DIAGNOSIS — I25110 Atherosclerotic heart disease of native coronary artery with unstable angina pectoris: Secondary | ICD-10-CM

## 2016-05-14 DIAGNOSIS — I2510 Atherosclerotic heart disease of native coronary artery without angina pectoris: Secondary | ICD-10-CM | POA: Diagnosis not present

## 2016-05-14 LAB — CBC
HEMATOCRIT: 38.2 % — AB (ref 38.5–50.0)
HEMOGLOBIN: 12.5 g/dL — AB (ref 13.2–17.1)
MCH: 28.2 pg (ref 27.0–33.0)
MCHC: 32.7 g/dL (ref 32.0–36.0)
MCV: 86 fL (ref 80.0–100.0)
MPV: 9.9 fL (ref 7.5–12.5)
Platelets: 220 10*3/uL (ref 140–400)
RBC: 4.44 MIL/uL (ref 4.20–5.80)
RDW: 14.1 % (ref 11.0–15.0)
WBC: 5.7 10*3/uL (ref 3.8–10.8)

## 2016-05-14 LAB — PROTIME-INR
INR: 0.94 (ref <1.50)
PROTHROMBIN TIME: 12.7 s (ref 11.6–15.2)

## 2016-05-14 LAB — BASIC METABOLIC PANEL
BUN: 9 mg/dL (ref 7–25)
CALCIUM: 9 mg/dL (ref 8.6–10.3)
CO2: 30 mmol/L (ref 20–31)
Chloride: 104 mmol/L (ref 98–110)
Creat: 0.93 mg/dL (ref 0.70–1.25)
Glucose, Bld: 94 mg/dL (ref 65–99)
POTASSIUM: 4.3 mmol/L (ref 3.5–5.3)
SODIUM: 140 mmol/L (ref 135–146)

## 2016-05-14 NOTE — Progress Notes (Signed)
Chief Complaint  Patient presents with  . Follow-up  . Chest Pain    pt states a few chest pain that feels like sharp pains   . Shortness of Breath    some SOB even when just sitting       History of Present Illness: 60 yo male with history of CAD with stenting, prior cardiac arrest, carotid artery disease, HTN, hypothyroidism, HLD, OSA, large B cell lymphoma who is here today for follow up. I have not seen him in the office prior to today. I did perform his cardiac cath and stenting of the ostial and mid RCA in August 2014. He had been followed in the [facility] and only followed up in our office once in September of 2014. He called our office last week asking to be seen to discuss repeat PCI. He had a near syncopal event last week. He has chest pains, dyspnea. Stress test in 2016 abnormal but no cath performed. He uinderwent cath 05/04/16 at the [facility] and was found to have 75% mid LAD stenosis and 60% mid RCA stenosis. He was told he needed PCI of the LAD and FFR of the RCA so he called [place] asking to be seen to discuss PCI. He describes ongoing fatigue, dyspnea and weakness. Chest pains occur several times per week. He has known carotid artery disease, followed in the [place]. Last dopplers May 2017 with 50-69% bilateral stenosis.    Primary Care Physician: [place] [facility]   Past Medical History  Diagnosis Date  . Inguinal hernia, left   . Lumbar disc disease     a. with chronic LBP  . Carotid artery disease ([place])     a. Dopplers 01/2012: 60-79% bilat carotid dz;  b. 07/2013 60-79% RICA stenosis & 123456 LICA stenosis.  . Allergic rhinitis, cause unspecified   . Lumbago   . Male infertility, unspecified   . Posttraumatic stress disorder   . Diverticulosis of colon (without mention of hemorrhage)   . Unspecified hypothyroidism   . Unspecified essential hypertension   . Hyperlipemia   . CAD (coronary artery disease)     a. history of sudden cardiac arrest, s/p MI's in 1995 and 1997 with  prior stenting;  b. 07/2013 Cath/PCI: LM nl, LAD 50p/m, D1 nl, LCX nl, RI nl, RCA dominant, 99ost (3.0x20 Promus Premier DES)/30m(3.0x12 Promus Premier DES), EF 55% w/ inf HK.  . Closed TBI (traumatic brain injury) ([place]) 1974    "memory issues since" (08/04/2013)  . Heart murmur   . OSA (obstructive sleep apnea)     a. not using CPAP.  . Migraines     "maybe once or twice/yr" (08/04/2013)  . Arthritis     "hands, neck, back, hips" (08/04/2013)  . Anxiety   . Depression   . Diffuse large B cell lymphoma (HCC)     a. s/p radical neck dissection. b. followed once a year - last check 09/2012 was OK.  . Ventricular bigeminy     a. pseudobradycardia 07/2013 - bigeminy tends to register low on HR monitors.  Marland Kitchen NHL (non-Hodgkin's lymphoma) ([place]) 10/09/2013  . Unspecified vitamin D deficiency 10/12/2013    Past Surgical History  Procedure Laterality Date  . Rotator cuff repair    . Deep neck lymph node biopsy / excision Right 07/2010  . Radical neck dissection Right ~ 09/2010    "'for lymphoma;  (Dr Erik Obey)" (99991111)  . Tonsillectomy  ~ 09/2010  . Umbilical hernia repair  ?1990's  .  Inguinal hernia repair Left 2013  . Shoulder arthroscopy w/ rotator cuff repair Left 2000's  . 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 Coronary angioplasty with stent placement  1995; 08/05/2013    "1 + 2" (08/12/2013)  . Left heart catheterization with coronary angiogram N/A 08/05/2013    Procedure: LEFT HEART CATHETERIZATION WITH CORONARY ANGIOGRAM;  Surgeon: Burnell Blanks, MD;  Location: [facility] CATH LAB;  Service: Cardiovascular;  Laterality: N/A;    Current Outpatient Prescriptions  Medication Sig Dispense Refill  . aspirin 81 MG tablet Take 1 tablet (81 mg total) by mouth daily. 30 tablet   . atorvastatin (LIPITOR) 80 MG tablet Take 1 tablet (80 mg total) by mouth daily. 30 tablet 6  . buPROPion (WELLBUTRIN XL) 300 MG 24 hr tablet Take 300 mg by mouth daily.    . cholecalciferol 2000 UNITS TABS  Take 2,000 Units by mouth daily. 90 tablet 3  . citalopram (CELEXA) 40 MG tablet Take 40 mg by mouth daily.    . clopidogrel (PLAVIX) 75 MG tablet Take 1 tablet (75 mg total) by mouth daily with breakfast. 30 tablet 6  . gabapentin (NEURONTIN) 300 MG capsule Take 300 mg by mouth 3 (three) times daily.    Marland Kitchen levothyroxine (SYNTHROID, LEVOTHROID) 150 MCG tablet Take 1 tablet (150 mcg total) by mouth daily before breakfast. 30 tablet 2  . metoprolol tartrate (LOPRESSOR) 25 MG tablet Take 0.5 tablets (12.5 mg total) by mouth 2 (two) times daily. 30 tablet 6  . Multiple Vitamin (MULTIVITAMIN WITH MINERALS) TABS tablet Take 1 tablet by mouth daily.    . nitroGLYCERIN (NITROSTAT) 0.4 MG SL tablet Place 1 tablet (0.4 mg total) under the tongue every 5 (five) minutes x 3 doses as needed for chest pain. 25 tablet 3  . pantoprazole (PROTONIX) 40 MG tablet Take 1 tablet (40 mg total) by mouth daily. 30 tablet 6  . PRESCRIPTION MEDICATION Apply 1 application topically at bedtime. Cream from [place] for dermatitis    . traZODone (DESYREL) 100 MG tablet Take 200 mg by mouth at bedtime.    . vitamin E 400 UNIT capsule Take 400 Units by mouth daily.     No current facility-administered medications for this visit.    Allergies  Allergen Reactions  . Fish Allergy Nausea And Vomiting  . Shellfish Allergy Nausea And Vomiting    Social History   Social History  . Marital Status: Married    Spouse Name: N/A  . Number of Children: N/A  . Years of Education: N/A   Occupational History  . Disabled    Social History Main Topics  . Smoking status: Former Smoker -- 1.50 packs/day for 35 years    Types: Cigarettes    Quit date: 04/11/2004  . Smokeless tobacco: Never Used  . Alcohol Use: Yes     Comment: Hx abuse, quit 2005  . Drug Use: No  . Sexual Activity: Not Currently   Other Topics Concern  . 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 On disability and [facility]  [facility].  Lives with wife. They  lost her son in a motorcycle accident by a drunk driver.  Close to his 62 year old granddaughter.     Family History  Problem Relation Age of Onset  . Cancer Mother   . Heart disease Mother   . Alcohol abuse Other   . Heart attack Father  3 heart attacks     Review of Systems:  As stated in the HPI and otherwise negative.   BP 146/68 mmHg  Pulse 67  Ht 5\' 11"  (1.803 m)  Wt 268 lb 12.8 oz (121.927 kg)  BMI 37.51 kg/m2  Physical Examination: General: Well developed, well nourished, NAD HEENT: OP clear, mucus membranes moist SKIN: warm, dry. No rashes. Neuro: No focal deficits Musculoskeletal: Muscle strength 5/5 all ext Psychiatric: Mood and affect normal Neck: No JVD, no carotid bruits, no thyromegaly, no lymphadenopathy. Lungs:Clear bilaterally, no wheezes, rhonci, crackles Cardiovascular: Regular rate and rhythm. No murmurs, gallops or rubs. Abdomen:Soft. Bowel sounds present. Non-tender.  Extremities: No lower extremity edema. Pulses are 2 + in the bilateral DP/PT.  EKG:  EKG is ordered today. The ekg ordered today demonstrates NSR, rate 67 bpm  Recent Labs: No results found for requested labs within last 365 days.   Lipid Panel    Component Value Date/Time   CHOL 274* 08/05/2013 0300   TRIG 277* 08/05/2013 0300   HDL 31* 08/05/2013 0300   CHOLHDL 8.8 08/05/2013 0300   VLDL 55* 08/05/2013 0300   LDLCALC 188* 08/05/2013 0300   LDLDIRECT 83.8 02/20/2011 1205     Wt Readings from Last 3 Encounters:  05/14/16 268 lb 12.8 oz (121.927 kg)  10/12/13 272 lb 14.4 oz (123.787 kg)  09/02/13 264 lb (119.75 kg)     Other studies Reviewed: Additional studies/ records that were reviewed today include: Records from the [place] including labs and cath report Review of the above records demonstrates: severe CAD  Assessment and Plan:   1. CAD/Unstable angina: He has known CAD. He now has unstable angina. Cath at [facility] 05/04/16  with severe LAD stenosis, moderate RCA stenosis. Will plan cath with PCI on Thursday this week at [facility]. Pre-cath labs today. Risks and benefits of procedure reviewed and he agrees to proceed. Continue current meds. He is on ASA, statin, Plavix, beta blocker.   Current medicines are reviewed at length with the patient today.  The patient does not have concerns regarding medicines.  The following changes have been made:  no change  Labs/ tests ordered today include:   Orders Placed This Encounter  Procedures  . Basic Metabolic Panel (BMET)  . CBC  . INR/PT  . EKG 12-Lead     Disposition:   FU with me in 1 month   Signed, Lauree Chandler, MD 05/14/2016 10:51 AM    [facility] [address], [place], [place]  [address] Phone: [phone]; Fax: [phone]

## 2016-05-14 NOTE — Patient Instructions (Signed)
Medication Instructions:  Your physician recommends that you continue on your current medications as directed. Please refer to the Current Medication list given to you today.   Labwork: Lab work to be done today--BMP, CBC, PT  Testing/Procedures: Your physician has requested that you have a cardiac catheterization. Cardiac catheterization is used to diagnose and/or treat various heart conditions. Doctors may recommend this procedure for a number of different reasons. The most common reason is to evaluate chest pain. Chest pain can be a symptom of coronary artery disease (CAD), and cardiac catheterization can show whether plaque is narrowing or blocking your heart's arteries. This procedure is also used to evaluate the valves, as well as measure the blood flow and oxygen levels in different parts of your heart. For further information please visit [email or web address]. Please follow instruction sheet, as given.    Follow-Up: To be arranged after procedure.   Any Other Special Instructions Will Be Listed Below (If Applicable).     If you need a refill on your cardiac medications before your next appointment, please call your pharmacy.

## 2016-05-17 ENCOUNTER — Encounter (HOSPITAL_COMMUNITY): Payer: Self-pay | Admitting: General Practice

## 2016-05-17 ENCOUNTER — Telehealth: Payer: Self-pay | Admitting: Cardiovascular Disease

## 2016-05-17 ENCOUNTER — Ambulatory Visit (HOSPITAL_COMMUNITY)
Admission: RE | Admit: 2016-05-17 | Discharge: 2016-05-18 | Disposition: A | Discharge status: Home / Self Care | Payer: No Typology Code available for payment source | Source: Ambulatory Visit | Attending: Cardiovascular Disease | Admitting: Cardiovascular Disease

## 2016-05-17 ENCOUNTER — Encounter (HOSPITAL_COMMUNITY)
Admission: RE | Disposition: A | Discharge status: Home / Self Care | Payer: Self-pay | Source: Ambulatory Visit | Attending: Cardiovascular Disease

## 2016-05-17 DIAGNOSIS — I252 Old myocardial infarction: Secondary | ICD-10-CM | POA: Insufficient documentation

## 2016-05-17 DIAGNOSIS — E785 Hyperlipidemia, unspecified: Secondary | ICD-10-CM | POA: Insufficient documentation

## 2016-05-17 DIAGNOSIS — Z7982 Long term (current) use of aspirin: Secondary | ICD-10-CM | POA: Insufficient documentation

## 2016-05-17 DIAGNOSIS — E039 Hypothyroidism, unspecified: Secondary | ICD-10-CM | POA: Insufficient documentation

## 2016-05-17 DIAGNOSIS — I200 Unstable angina: Secondary | ICD-10-CM | POA: Diagnosis present

## 2016-05-17 DIAGNOSIS — Z7902 Long term (current) use of antithrombotics/antiplatelets: Secondary | ICD-10-CM | POA: Insufficient documentation

## 2016-05-17 DIAGNOSIS — G4733 Obstructive sleep apnea (adult) (pediatric): Secondary | ICD-10-CM | POA: Diagnosis not present

## 2016-05-17 DIAGNOSIS — Z955 Presence of coronary angioplasty implant and graft: Secondary | ICD-10-CM | POA: Diagnosis not present

## 2016-05-17 DIAGNOSIS — F4310 Post-traumatic stress disorder, unspecified: Secondary | ICD-10-CM | POA: Insufficient documentation

## 2016-05-17 DIAGNOSIS — I25110 Atherosclerotic heart disease of native coronary artery with unstable angina pectoris: Secondary | ICD-10-CM | POA: Diagnosis not present

## 2016-05-17 DIAGNOSIS — I10 Essential (primary) hypertension: Secondary | ICD-10-CM | POA: Insufficient documentation

## 2016-05-17 DIAGNOSIS — Z87820 Personal history of traumatic brain injury: Secondary | ICD-10-CM | POA: Insufficient documentation

## 2016-05-17 DIAGNOSIS — Z87891 Personal history of nicotine dependence: Secondary | ICD-10-CM | POA: Diagnosis not present

## 2016-05-17 DIAGNOSIS — Z8674 Personal history of sudden cardiac arrest: Secondary | ICD-10-CM | POA: Diagnosis not present

## 2016-05-17 HISTORY — PX: CARDIAC CATHETERIZATION: SHX172

## 2016-05-17 HISTORY — DX: Angina pectoris, unspecified: I20.9

## 2016-05-17 HISTORY — DX: Gastro-esophageal reflux disease without esophagitis: K21.9

## 2016-05-17 HISTORY — DX: Repeated falls: R29.6

## 2016-05-17 HISTORY — DX: Acute myocardial infarction, unspecified: I21.9

## 2016-05-17 LAB — POCT ACTIVATED CLOTTING TIME
ACTIVATED CLOTTING TIME: 296 s
Activated Clotting Time: 260 seconds

## 2016-05-17 SURGERY — CORONARY STENT INTERVENTION

## 2016-05-17 MED ORDER — SODIUM CHLORIDE 0.9 % IV SOLN: 250.0000 mL | INTRAVENOUS | Status: DC | PRN

## 2016-05-17 MED ORDER — CLOPIDOGREL BISULFATE 75 MG PO TABS
ORAL_TABLET | ORAL | Status: AC
  Administered 2016-05-17: 75 mg via ORAL
  Filled 2016-05-17: qty 1

## 2016-05-17 MED ORDER — FENTANYL CITRATE (PF) 100 MCG/2ML IJ SOLN
INTRAMUSCULAR | Status: DC | PRN
  Administered 2016-05-17 (×3): 25 ug via INTRAVENOUS
  Administered 2016-05-17: 50 ug via INTRAVENOUS

## 2016-05-17 MED ORDER — FENTANYL CITRATE (PF) 100 MCG/2ML IJ SOLN
INTRAMUSCULAR | Status: AC
  Filled 2016-05-17: qty 2

## 2016-05-17 MED ORDER — TRAZODONE HCL 50 MG PO TABS
200.0000 mg | ORAL_TABLET | Freq: Every day | ORAL | Status: DC
  Administered 2016-05-17: 200 mg via ORAL
  Filled 2016-05-17: qty 4

## 2016-05-17 MED ORDER — CLOPIDOGREL BISULFATE 75 MG PO TABS
75.0000 mg | ORAL_TABLET | Freq: Once | ORAL | Status: AC
  Administered 2016-05-17: 75 mg via ORAL

## 2016-05-17 MED ORDER — MIDAZOLAM HCL 2 MG/2ML IJ SOLN
INTRAMUSCULAR | Status: AC
Start: 2016-05-17 — End: 2016-05-17
  Filled 2016-05-17: qty 2

## 2016-05-17 MED ORDER — ACETAMINOPHEN 325 MG PO TABS
650.0000 mg | ORAL_TABLET | ORAL | Status: DC | PRN
  Administered 2016-05-17: 650 mg via ORAL
  Filled 2016-05-17: qty 2

## 2016-05-17 MED ORDER — MIDAZOLAM HCL 2 MG/2ML IJ SOLN
INTRAMUSCULAR | Status: AC
  Filled 2016-05-17: qty 2

## 2016-05-17 MED ORDER — LIDOCAINE HCL (PF) 1 % IJ SOLN
INTRAMUSCULAR | Status: DC | PRN
  Administered 2016-05-17: 5 mL

## 2016-05-17 MED ORDER — MIDAZOLAM HCL 2 MG/2ML IJ SOLN
INTRAMUSCULAR | Status: DC | PRN
Start: 2016-05-17 — End: 2016-05-17
  Administered 2016-05-17 (×3): 1 mg via INTRAVENOUS
  Administered 2016-05-17: 2 mg via INTRAVENOUS

## 2016-05-17 MED ORDER — VITAMIN D 1000 UNITS PO TABS
2000.0000 [IU] | ORAL_TABLET | Freq: Every day | ORAL | Status: DC
  Administered 2016-05-17 – 2016-05-18 (×2): 2000 [IU] via ORAL
  Filled 2016-05-17 (×2): qty 2

## 2016-05-17 MED ORDER — HEPARIN (PORCINE) IN NACL 2-0.9 UNIT/ML-% IJ SOLN
INTRAMUSCULAR | Status: AC
  Filled 2016-05-17: qty 500

## 2016-05-17 MED ORDER — IOPAMIDOL (ISOVUE-370) INJECTION 76%
INTRAVENOUS | Status: AC
  Filled 2016-05-17: qty 125

## 2016-05-17 MED ORDER — HEPARIN SODIUM (PORCINE) 1000 UNIT/ML IJ SOLN
INTRAMUSCULAR | Status: DC | PRN
  Administered 2016-05-17: 6000 [IU] via INTRAVENOUS
  Administered 2016-05-17: 3000 [IU] via INTRAVENOUS
  Administered 2016-05-17: 5000 [IU] via INTRAVENOUS

## 2016-05-17 MED ORDER — ONDANSETRON HCL 4 MG/2ML IJ SOLN: 4.0000 mg | Freq: Four times a day (QID) | INTRAMUSCULAR | Status: DC | PRN

## 2016-05-17 MED ORDER — ASPIRIN 81 MG PO CHEW
CHEWABLE_TABLET | ORAL | Status: AC
  Administered 2016-05-17: 81 mg via ORAL
  Filled 2016-05-17: qty 1

## 2016-05-17 MED ORDER — ADENOSINE 12 MG/4ML IV SOLN
12.0000 mL | INTRAVENOUS | Status: DC
  Filled 2016-05-17: qty 12

## 2016-05-17 MED ORDER — SODIUM CHLORIDE 0.9% FLUSH: 3.0000 mL | Freq: Two times a day (BID) | INTRAVENOUS | Status: DC

## 2016-05-17 MED ORDER — CITALOPRAM HYDROBROMIDE 20 MG PO TABS
40.0000 mg | ORAL_TABLET | Freq: Every day | ORAL | Status: DC
  Administered 2016-05-17 – 2016-05-18 (×2): 40 mg via ORAL
  Filled 2016-05-17 (×2): qty 2

## 2016-05-17 MED ORDER — ASPIRIN 81 MG PO CHEW
81.0000 mg | CHEWABLE_TABLET | ORAL | Status: AC
  Administered 2016-05-17: 81 mg via ORAL

## 2016-05-17 MED ORDER — SODIUM CHLORIDE 0.9 % IV SOLN
INTRAVENOUS | Status: DC
  Administered 2016-05-17: 11:00:00 via INTRAVENOUS

## 2016-05-17 MED ORDER — NITROGLYCERIN 0.4 MG SL SUBL: 0.4000 mg | SUBLINGUAL_TABLET | SUBLINGUAL | Status: DC | PRN

## 2016-05-17 MED ORDER — NITROGLYCERIN 1 MG/10 ML FOR IR/CATH LAB
INTRA_ARTERIAL | Status: AC
  Filled 2016-05-17: qty 10

## 2016-05-17 MED ORDER — VERAPAMIL HCL 2.5 MG/ML IV SOLN
INTRAVENOUS | Status: AC
  Filled 2016-05-17: qty 2

## 2016-05-17 MED ORDER — PANTOPRAZOLE SODIUM 40 MG PO TBEC
40.0000 mg | DELAYED_RELEASE_TABLET | Freq: Every day | ORAL | Status: DC
  Administered 2016-05-17 – 2016-05-18 (×2): 40 mg via ORAL
  Filled 2016-05-17 (×3): qty 1

## 2016-05-17 MED ORDER — CLOPIDOGREL BISULFATE 75 MG PO TABS
75.0000 mg | ORAL_TABLET | Freq: Every day | ORAL | Status: DC
  Administered 2016-05-18: 75 mg via ORAL
  Filled 2016-05-17: qty 1

## 2016-05-17 MED ORDER — SODIUM CHLORIDE 0.9% FLUSH: 3.0000 mL | INTRAVENOUS | Status: DC | PRN

## 2016-05-17 MED ORDER — BUPROPION HCL ER (XL) 300 MG PO TB24
300.0000 mg | ORAL_TABLET | Freq: Every day | ORAL | Status: DC
  Administered 2016-05-17 – 2016-05-18 (×2): 300 mg via ORAL
  Filled 2016-05-17 (×2): qty 1

## 2016-05-17 MED ORDER — ADULT MULTIVITAMIN W/MINERALS CH
1.0000 | ORAL_TABLET | Freq: Every day | ORAL | Status: DC
  Administered 2016-05-17 – 2016-05-18 (×2): 1 via ORAL
  Filled 2016-05-17 (×2): qty 1

## 2016-05-17 MED ORDER — LEVOTHYROXINE SODIUM 75 MCG PO TABS
150.0000 ug | ORAL_TABLET | Freq: Every day | ORAL | Status: DC
  Administered 2016-05-18: 150 ug via ORAL
  Filled 2016-05-17 (×2): qty 2

## 2016-05-17 MED ORDER — ATORVASTATIN CALCIUM 80 MG PO TABS
80.0000 mg | ORAL_TABLET | Freq: Every day | ORAL | Status: DC
  Administered 2016-05-18: 09:00:00 80 mg via ORAL
  Filled 2016-05-17: qty 1

## 2016-05-17 MED ORDER — IOPAMIDOL (ISOVUE-370) INJECTION 76%
INTRAVENOUS | Status: DC | PRN
  Administered 2016-05-17: 175 mL via INTRA_ARTERIAL

## 2016-05-17 MED ORDER — IOPAMIDOL (ISOVUE-370) INJECTION 76%
INTRAVENOUS | Status: AC
  Filled 2016-05-17: qty 50

## 2016-05-17 MED ORDER — METOPROLOL TARTRATE 12.5 MG HALF TABLET
12.5000 mg | ORAL_TABLET | Freq: Two times a day (BID) | ORAL | Status: DC
  Administered 2016-05-17 – 2016-05-18 (×2): 12.5 mg via ORAL
  Filled 2016-05-17 (×2): qty 1

## 2016-05-17 MED ORDER — HEPARIN SODIUM (PORCINE) 1000 UNIT/ML IJ SOLN
INTRAMUSCULAR | Status: AC
  Filled 2016-05-17: qty 1

## 2016-05-17 MED ORDER — ADENOSINE (DIAGNOSTIC) 140MCG/KG/MIN
INTRAVENOUS | Status: DC | PRN
  Administered 2016-05-17: 140 ug/kg/min via INTRAVENOUS

## 2016-05-17 MED ORDER — ASPIRIN EC 81 MG PO TBEC
81.0000 mg | DELAYED_RELEASE_TABLET | Freq: Every day | ORAL | Status: DC
  Administered 2016-05-18: 81 mg via ORAL
  Filled 2016-05-17: qty 1

## 2016-05-17 MED ORDER — ADENOSINE (DIAGNOSTIC) 3 MG/ML IV SOLN
20.0000 mL | INTRAVENOUS | Status: DC
  Filled 2016-05-17: qty 20

## 2016-05-17 MED ORDER — VITAMIN E 180 MG (400 UNIT) PO CAPS
400.0000 [IU] | ORAL_CAPSULE | Freq: Every day | ORAL | Status: DC
  Administered 2016-05-18: 400 [IU] via ORAL
  Filled 2016-05-17 (×2): qty 1

## 2016-05-17 MED ORDER — VERAPAMIL HCL 2.5 MG/ML IV SOLN
INTRAVENOUS | Status: DC | PRN
  Administered 2016-05-17: 10 mL via INTRA_ARTERIAL

## 2016-05-17 MED ORDER — SODIUM CHLORIDE 0.9 % IV SOLN
INTRAVENOUS | Status: AC
  Administered 2016-05-17: 15:00:00 via INTRAVENOUS

## 2016-05-17 MED ORDER — GABAPENTIN 300 MG PO CAPS
300.0000 mg | ORAL_CAPSULE | Freq: Three times a day (TID) | ORAL | Status: DC
  Administered 2016-05-17 – 2016-05-18 (×2): 300 mg via ORAL
  Filled 2016-05-17 (×2): qty 1

## 2016-05-17 MED ORDER — ANGIOPLASTY BOOK
Freq: Once | Status: AC
  Administered 2016-05-18: 07:00:00
  Filled 2016-05-17: qty 1

## 2016-05-17 MED ORDER — HEPARIN (PORCINE) IN NACL 2-0.9 UNIT/ML-% IJ SOLN
INTRAMUSCULAR | Status: DC | PRN
  Administered 2016-05-17: 1500 mL

## 2016-05-17 SURGICAL SUPPLY — 21 items
BALLN EMERGE MR 2.5X8 (BALLOONS) ×4
BALLN ~~LOC~~ EMERGE MR 3.0X8 (BALLOONS) ×4
BALLOON EMERGE MR 2.5X8 (BALLOONS) IMPLANT
BALLOON ~~LOC~~ EMERGE MR 3.0X8 (BALLOONS) IMPLANT
CATH INFINITI 5 FR JL3.5 (CATHETERS) ×2 IMPLANT
CATH INFINITI 5FR AL1 (CATHETERS) ×2 IMPLANT
CATH INFINITI 5FR MULTPACK ANG (CATHETERS) ×2 IMPLANT
CATH MICROCATH NAVVUS (MICROCATHETER) IMPLANT
CATH VISTA GUIDE 6FR XBLAD3.5 (CATHETERS) ×2 IMPLANT
DEVICE RAD COMP TR BAND LRG (VASCULAR PRODUCTS) ×2 IMPLANT
GLIDESHEATH SLEND SS 6F .021 (SHEATH) ×2 IMPLANT
KIT ENCORE 26 ADVANTAGE (KITS) ×4 IMPLANT
KIT HEART LEFT (KITS) ×4 IMPLANT
MICROCATHETER NAVVUS (MICROCATHETER) ×4
PACK CARDIAC CATHETERIZATION (CUSTOM PROCEDURE TRAY) ×4 IMPLANT
STENT PROMUS PREM MR 3.0X12 (Permanent Stent) ×2 IMPLANT
TRANSDUCER W/STOPCOCK (MISCELLANEOUS) ×4 IMPLANT
TUBING CIL FLEX 10 FLL-RA (TUBING) ×4 IMPLANT
WIRE COUGAR XT STRL 190CM (WIRE) ×2 IMPLANT
WIRE HI TORQ WHISPER MS 190CM (WIRE) ×2 IMPLANT
WIRE SAFE-T 1.5MM-J .035X260CM (WIRE) ×2 IMPLANT

## 2016-05-17 NOTE — H&P (View-Only) (Signed)
Chief Complaint  Patient presents with  . Follow-up  . Chest Pain    pt states a few chest pain that feels like sharp pains   . Shortness of Breath    some SOB even when just sitting       History of Present Illness: 60 yo male with history of CAD with stenting, prior cardiac arrest, carotid artery disease, HTN, hypothyroidism, HLD, OSA, large B cell lymphoma who is here today for follow up. I have not seen him in the office prior to today. I did perform his cardiac cath and stenting of the ostial and mid RCA in August 2014. He had been followed in the [facility] and only followed up in our office once in September of 2014. He called our office last week asking to be seen to discuss repeat PCI. He had a near syncopal event last week. He has chest pains, dyspnea. Stress test in 2016 abnormal but no cath performed. He uinderwent cath 05/04/16 at the [facility] and was found to have 75% mid LAD stenosis and 60% mid RCA stenosis. He was told he needed PCI of the LAD and FFR of the RCA so he called [place] asking to be seen to discuss PCI. He describes ongoing fatigue, dyspnea and weakness. Chest pains occur several times per week. He has known carotid artery disease, followed in the [place]. Last dopplers May 2017 with 50-69% bilateral stenosis.    Primary Care Physician: [facility]   Past Medical History  Diagnosis Date  . Inguinal hernia, left   . Lumbar disc disease     a. with chronic LBP  . Carotid artery disease ([place])     a. Dopplers 01/2012: 60-79% bilat carotid dz;  b. 07/2013 60-79% RICA stenosis & 123456 LICA stenosis.  . Allergic rhinitis, cause unspecified   . Lumbago   . Male infertility, unspecified   . Posttraumatic stress disorder   . Diverticulosis of colon (without mention of hemorrhage)   . Unspecified hypothyroidism   . Unspecified essential hypertension   . Hyperlipemia   . CAD (coronary artery disease)     a. history of sudden cardiac arrest, s/p MI's in 1995 and 1997 with  prior stenting;  b. 07/2013 Cath/PCI: LM nl, LAD 50p/m, D1 nl, LCX nl, RI nl, RCA dominant, 99ost (3.0x20 Promus Premier DES)/27m(3.0x12 Promus Premier DES), EF 55% w/ inf HK.  . Closed TBI (traumatic brain injury) ([place]) 1974    "memory issues since" (08/04/2013)  . Heart murmur   . OSA (obstructive sleep apnea)     a. not using CPAP.  . Migraines     "maybe once or twice/yr" (08/04/2013)  . Arthritis     "hands, neck, back, hips" (08/04/2013)  . Anxiety   . Depression   . Diffuse large B cell lymphoma (HCC)     a. s/p radical neck dissection. b. followed once a year - last check 09/2012 was OK.  . Ventricular bigeminy     a. pseudobradycardia 07/2013 - bigeminy tends to register low on HR monitors.  Marland Kitchen NHL (non-Hodgkin's lymphoma) ([place]) 10/09/2013  . Unspecified vitamin D deficiency 10/12/2013    Past Surgical History  Procedure Laterality Date  . Rotator cuff repair    . Deep neck lymph node biopsy / excision Right 07/2010  . Radical neck dissection Right ~ 09/2010    "'for lymphoma;  (Dr Erik Obey)" (99991111)  . Tonsillectomy  ~ 09/2010  . Umbilical hernia repair  ?1990's  .  Inguinal hernia repair Left 2013  . Shoulder arthroscopy w/ rotator cuff repair Left 2000's  . 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 Coronary angioplasty with stent placement  1995; 08/05/2013    "1 + 2" (08/12/2013)  . Left heart catheterization with coronary angiogram N/A 08/05/2013    Procedure: LEFT HEART CATHETERIZATION WITH CORONARY ANGIOGRAM;  Surgeon: Burnell Blanks, MD;  Location: [facility] CATH LAB;  Service: Cardiovascular;  Laterality: N/A;    Current Outpatient Prescriptions  Medication Sig Dispense Refill  . aspirin 81 MG tablet Take 1 tablet (81 mg total) by mouth daily. 30 tablet   . atorvastatin (LIPITOR) 80 MG tablet Take 1 tablet (80 mg total) by mouth daily. 30 tablet 6  . buPROPion (WELLBUTRIN XL) 300 MG 24 hr tablet Take 300 mg by mouth daily.    . cholecalciferol 2000 UNITS TABS  Take 2,000 Units by mouth daily. 90 tablet 3  . citalopram (CELEXA) 40 MG tablet Take 40 mg by mouth daily.    . clopidogrel (PLAVIX) 75 MG tablet Take 1 tablet (75 mg total) by mouth daily with breakfast. 30 tablet 6  . gabapentin (NEURONTIN) 300 MG capsule Take 300 mg by mouth 3 (three) times daily.    Marland Kitchen levothyroxine (SYNTHROID, LEVOTHROID) 150 MCG tablet Take 1 tablet (150 mcg total) by mouth daily before breakfast. 30 tablet 2  . metoprolol tartrate (LOPRESSOR) 25 MG tablet Take 0.5 tablets (12.5 mg total) by mouth 2 (two) times daily. 30 tablet 6  . Multiple Vitamin (MULTIVITAMIN WITH MINERALS) TABS tablet Take 1 tablet by mouth daily.    . nitroGLYCERIN (NITROSTAT) 0.4 MG SL tablet Place 1 tablet (0.4 mg total) under the tongue every 5 (five) minutes x 3 doses as needed for chest pain. 25 tablet 3  . pantoprazole (PROTONIX) 40 MG tablet Take 1 tablet (40 mg total) by mouth daily. 30 tablet 6  . PRESCRIPTION MEDICATION Apply 1 application topically at bedtime. Cream from [place] for dermatitis    . traZODone (DESYREL) 100 MG tablet Take 200 mg by mouth at bedtime.    . vitamin E 400 UNIT capsule Take 400 Units by mouth daily.     No current facility-administered medications for this visit.    Allergies  Allergen Reactions  . Fish Allergy Nausea And Vomiting  . Shellfish Allergy Nausea And Vomiting    Social History   Social History  . Marital Status: Married    Spouse Name: N/A  . Number of Children: N/A  . Years of Education: N/A   Occupational History  . Disabled    Social History Main Topics  . Smoking status: Former Smoker -- 1.50 packs/day for 35 years    Types: Cigarettes    Quit date: 04/11/2004  . Smokeless tobacco: Never Used  . Alcohol Use: Yes     Comment: Hx abuse, quit 2005  . Drug Use: No  . Sexual Activity: Not Currently   Other Topics Concern  . 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 On disability and [facility]  [facility].  Lives with wife. They  lost her son in a motorcycle accident by a drunk driver.  Close to his 83 year old granddaughter.     Family History  Problem Relation Age of Onset  . Cancer Mother   . Heart disease Mother   . Alcohol abuse Other   . Heart attack Father  3 heart attacks     Review of Systems:  As stated in the HPI and otherwise negative.   BP 146/68 mmHg  Pulse 67  Ht 5\' 11"  (1.803 m)  Wt 268 lb 12.8 oz (121.927 kg)  BMI 37.51 kg/m2  Physical Examination: General: Well developed, well nourished, NAD HEENT: OP clear, mucus membranes moist SKIN: warm, dry. No rashes. Neuro: No focal deficits Musculoskeletal: Muscle strength 5/5 all ext Psychiatric: Mood and affect normal Neck: No JVD, no carotid bruits, no thyromegaly, no lymphadenopathy. Lungs:Clear bilaterally, no wheezes, rhonci, crackles Cardiovascular: Regular rate and rhythm. No murmurs, gallops or rubs. Abdomen:Soft. Bowel sounds present. Non-tender.  Extremities: No lower extremity edema. Pulses are 2 + in the bilateral DP/PT.  EKG:  EKG is ordered today. The ekg ordered today demonstrates NSR, rate 67 bpm  Recent Labs: No results found for requested labs within last 365 days.   Lipid Panel    Component Value Date/Time   CHOL 274* 08/05/2013 0300   TRIG 277* 08/05/2013 0300   HDL 31* 08/05/2013 0300   CHOLHDL 8.8 08/05/2013 0300   VLDL 55* 08/05/2013 0300   LDLCALC 188* 08/05/2013 0300   LDLDIRECT 83.8 02/20/2011 1205     Wt Readings from Last 3 Encounters:  05/14/16 268 lb 12.8 oz (121.927 kg)  10/12/13 272 lb 14.4 oz (123.787 kg)  09/02/13 264 lb (119.75 kg)     Other studies Reviewed: Additional studies/ records that were reviewed today include: Records from the [place] including labs and cath report Review of the above records demonstrates: severe CAD  Assessment and Plan:   1. CAD/Unstable angina: He has known CAD. He now has unstable angina. Cath at [facility] 05/04/16  with severe LAD stenosis, moderate RCA stenosis. Will plan cath with PCI on Thursday this week at [facility]. Pre-cath labs today. Risks and benefits of procedure reviewed and he agrees to proceed. Continue current meds. He is on ASA, statin, Plavix, beta blocker.   Current medicines are reviewed at length with the patient today.  The patient does not have concerns regarding medicines.  The following changes have been made:  no change  Labs/ tests ordered today include:   Orders Placed This Encounter  Procedures  . Basic Metabolic Panel (BMET)  . CBC  . INR/PT  . EKG 12-Lead     Disposition:   FU with me in 1 month   Signed, Lauree Chandler, MD 05/14/2016 10:51 AM    [facility] [address], [place], [place]  [address] Phone: [phone]; Fax: [phone]

## 2016-05-17 NOTE — Telephone Encounter (Signed)
New message    Michael Singleton  wants to make sure that the [place] choice program has been aware 72 hours after pt is admitted  An authorization code needs to be approved   Please call ASAP

## 2016-05-17 NOTE — Progress Notes (Signed)
TR BAND REMOVAL  LOCATION:    right radial  DEFLATED PER PROTOCOL:    Yes.    TIME BAND OFF / DRESSING APPLIED:    1900   SITE UPON ARRIVAL:    Level 0  SITE AFTER BAND REMOVAL:    Level 0  CIRCULATION SENSATION AND MOVEMENT:    Within Normal Limits

## 2016-05-17 NOTE — Interval H&P Note (Signed)
History and Physical Interval Note:  05/17/2016 1:02 PM  Michael Singleton  has presented today for cardiac cath with the diagnosis of unstable angina. The various methods of treatment have been discussed with the patient and family. After consideration of risks, benefits and other options for treatment, the patient has consented to  Procedure(s): Coronary Stent Intervention (N/A) as a surgical intervention .  The patient's history has been reviewed, patient examined, no change in status, stable for surgery.  I have reviewed the patient's chart and labs.  Questions were answered to the patient's satisfaction.     Lauree Chandler

## 2016-05-18 ENCOUNTER — Encounter (HOSPITAL_COMMUNITY): Payer: Self-pay | Admitting: Cardiology

## 2016-05-18 DIAGNOSIS — Z8674 Personal history of sudden cardiac arrest: Secondary | ICD-10-CM | POA: Diagnosis not present

## 2016-05-18 DIAGNOSIS — I25110 Atherosclerotic heart disease of native coronary artery with unstable angina pectoris: Secondary | ICD-10-CM | POA: Diagnosis not present

## 2016-05-18 DIAGNOSIS — I200 Unstable angina: Secondary | ICD-10-CM

## 2016-05-18 DIAGNOSIS — I10 Essential (primary) hypertension: Secondary | ICD-10-CM | POA: Diagnosis not present

## 2016-05-18 DIAGNOSIS — Z87820 Personal history of traumatic brain injury: Secondary | ICD-10-CM | POA: Diagnosis not present

## 2016-05-18 LAB — CBC
HEMATOCRIT: 37.8 % — AB (ref 39.0–52.0)
HEMOGLOBIN: 11.9 g/dL — AB (ref 13.0–17.0)
MCH: 27.5 pg (ref 26.0–34.0)
MCHC: 31.5 g/dL (ref 30.0–36.0)
MCV: 87.5 fL (ref 78.0–100.0)
Platelets: 200 10*3/uL (ref 150–400)
RBC: 4.32 MIL/uL (ref 4.22–5.81)
RDW: 13.5 % (ref 11.5–15.5)
WBC: 6.3 10*3/uL (ref 4.0–10.5)

## 2016-05-18 LAB — BASIC METABOLIC PANEL
ANION GAP: 7 (ref 5–15)
BUN: 7 mg/dL (ref 6–20)
CALCIUM: 9.1 mg/dL (ref 8.9–10.3)
CO2: 28 mmol/L (ref 22–32)
Chloride: 108 mmol/L (ref 101–111)
Creatinine, Ser: 0.9 mg/dL (ref 0.61–1.24)
GFR calc non Af Amer: >60 mL/min (ref >60)
GLUCOSE: 101 mg/dL — AB (ref 65–99)
POTASSIUM: 4.9 mmol/L (ref 3.5–5.1)
Sodium: 143 mmol/L (ref 135–145)

## 2016-05-18 MED ORDER — METOPROLOL TARTRATE 25 MG PO TABS: 25.0000 mg | ORAL_TABLET | Freq: Two times a day (BID) | ORAL | Status: DC

## 2016-05-18 MED FILL — Nitroglycerin IV Soln 100 MCG/ML in D5W: INTRA_ARTERIAL | Qty: 10 | Status: AC

## 2016-05-18 NOTE — Progress Notes (Signed)
Patient Name: Michael Singleton Date of Encounter: 05/18/2016  Active Problems:   Unstable angina [facility])   Coronary artery disease involving native coronary artery of native heart with unstable angina pectoris [facility])   Primary Cardiologist: Dr. Angelena Form  Patient Profile: Michael Singleton is a 60 year old male with a past medical history of CAD (PCI to ostial and mid RCA in Aug. 2014), prior cardiac arrest, carotid artery disease, HTN, hypothyroidism, HLD, OSA, and large B cell lymphoma. He had a syncopal episode earlier this month and underwent LHC on 05/04/16 at the [place] and was found to have 75% mid LAD stenosis and 60% mid RCA stenosis. LHC yesterday, underwent successful PTCA/DES x 1 to mid LAD.   SUBJECTIVE: Feels well, no chest pain or SOB.    OBJECTIVE Filed Vitals:   05/17/16 1800 05/17/16 1924 05/17/16 2119 05/18/16 0416  BP: 124/61 155/61 150/80 131/55  Pulse:  69 63 63  Temp:  97.3 F (36.3 C)  98 F (36.7 C)  TempSrc:  Oral  Oral  Resp:  22  11  Height:      Weight:    267 lb 10.2 oz (121.4 kg)  SpO2:  97%  96%    Intake/Output Summary (Last 24 hours) at 05/18/16 0814 Last data filed at 05/18/16 0417  Gross per 24 hour  Intake    570 ml  Output   3700 ml  Net  -3130 ml   Filed Weights   05/17/16 1004 05/18/16 0416  Weight: 264 lb (119.75 kg) 267 lb 10.2 oz (121.4 kg)    PHYSICAL EXAM General: Well developed, well nourished, male in no acute distress. Head: Normocephalic, atraumatic.  Neck: Supple without bruits, no JVD. Lungs:  Resp regular and unlabored, CTA. Heart: RRR, S1, S2, no S3, S4, or murmur; no rub. Abdomen: Soft, non-tender, non-distended, BS + x 4.  Extremities: No clubbing, cyanosis,no edema.  Neuro: Alert and oriented X 3. Moves all extremities spontaneously. Psych: Normal affect.  LABS: CBC: Recent Labs  05/18/16 0604  WBC 6.3  HGB 11.9*  HCT 37.8*  MCV 87.5  PLT [id number]   Basic Metabolic Panel: Recent Labs  05/18/16 0604  NA 143  K 4.9   CL 108  CO2 28  GLUCOSE 101*  BUN 7  CREATININE 0.90  CALCIUM 9.1     Current facility-administered medications:  .  0.9 %  sodium chloride infusion, 250 mL, Intravenous, PRN, Burnell Blanks, MD .  acetaminophen (TYLENOL) tablet 650 mg, 650 mg, Oral, Q4H PRN, Burnell Blanks, MD, 650 mg at 05/17/16 2115 .  aspirin EC tablet 81 mg, 81 mg, Oral, Daily, Burnell Blanks, MD .  atorvastatin (LIPITOR) tablet 80 mg, 80 mg, Oral, Daily, Burnell Blanks, MD .  buPROPion (WELLBUTRIN XL) 24 hr tablet 300 mg, 300 mg, Oral, Daily, Burnell Blanks, MD, 300 mg at 05/17/16 2012 .  cholecalciferol (VITAMIN D) tablet 2,000 Units, 2,000 Units, Oral, Daily, Burnell Blanks, MD, 2,000 Units at 05/17/16 2011 .  citalopram (CELEXA) tablet 40 mg, 40 mg, Oral, Daily, Burnell Blanks, MD, 40 mg at 05/17/16 2011 .  clopidogrel (PLAVIX) tablet 75 mg, 75 mg, Oral, Q breakfast, Burnell Blanks, MD .  gabapentin (NEURONTIN) capsule 300 mg, 300 mg, Oral, TID, Burnell Blanks, MD, 300 mg at 05/17/16 2119 .  levothyroxine (SYNTHROID, LEVOTHROID) tablet 150 mcg, 150 mcg, Oral, QAC breakfast, Burnell Blanks, MD, 150 mcg at 05/18/16 [phone] .  metoprolol  tartrate (LOPRESSOR) tablet 12.5 mg, 12.5 mg, Oral, BID, Burnell Blanks, MD, 12.5 mg at 05/17/16 2119 .  multivitamin with minerals tablet 1 tablet, 1 tablet, Oral, Daily, Burnell Blanks, MD, 1 tablet at 05/17/16 2010 .  nitroGLYCERIN (NITROSTAT) SL tablet 0.4 mg, 0.4 mg, Sublingual, Q5 Min x 3 PRN, Burnell Blanks, MD .  ondansetron [facility]) injection 4 mg, 4 mg, Intravenous, Q6H PRN, Burnell Blanks, MD .  pantoprazole (PROTONIX) EC tablet 40 mg, 40 mg, Oral, Daily, Burnell Blanks, MD, 40 mg at 05/17/16 2012 .  sodium chloride flush (NS) 0.9 % injection 3 mL, 3 mL, Intravenous, Q12H, Burnell Blanks, MD .  sodium chloride flush (NS) 0.9 % injection 3 mL, 3 mL,  Intravenous, PRN, Burnell Blanks, MD .  traZODone (DESYREL) tablet 200 mg, 200 mg, Oral, QHS, Burnell Blanks, MD, 200 mg at 05/17/16 2119 .  vitamin E capsule 400 Units, 400 Units, Oral, Daily, Burnell Blanks, MD, 400 Units at 05/17/16 1500    TELE:  NSR      ECG: NSR  Coronary Stent Intervention Intravascular Pressure Wire/FFR Study Left Heart Cath and Coronary Angiography 05/17/16   [place] lesion, 20% stenosed.  Prox Cx to Mid Cx lesion, 20% stenosed.  Ost LAD to Prox LAD lesion, 20% stenosed.  Dist LAD lesion, 99% stenosed.  Mid RCA-1 lesion, 50% stenosed.  Prox RCA lesion, 50% stenosed.  Dist RCA lesion, 30% stenosed.  RPDA lesion, 40% stenosed.  Mid LAD lesion, 75% stenosed. Post intervention, there is a 0% residual stenosis. The lesion was not previously treated.  1. Severe stenosis mid LAD.  2. Successful PTCA/DES x 1 mid LAD 3. Moderate stenosis mid RCA. Patent stents ostial and mid RCA.  4. Unstable angina  Recommendations: Continue ASA and Plavix for at least one year. Continue statin and beta blocker.     Current Medications:  . aspirin EC  81 mg Oral Daily  . atorvastatin  80 mg Oral Daily  . buPROPion  300 mg Oral Daily  . cholecalciferol  2,000 Units Oral Daily  . citalopram  40 mg Oral Daily  . clopidogrel  75 mg Oral Q breakfast  . gabapentin  300 mg Oral TID  . levothyroxine  150 mcg Oral QAC breakfast  . metoprolol tartrate  12.5 mg Oral BID  . multivitamin with minerals  1 tablet Oral Daily  . pantoprazole  40 mg Oral Daily  . sodium chloride flush  3 mL Intravenous Q12H  . traZODone  200 mg Oral QHS  . vitamin E  400 Units Oral Daily      ASSESSMENT AND PLAN: Active Problems:   Unstable angina (HCC)   Coronary artery disease involving native coronary artery of native heart with unstable angina pectoris (Michael Singleton)  1. Coronary artery disease involving native coronary artery of native heart with unstable angina:  Patient has history of PCI in August 2014 of ostial and mid RCA. He is followed at the [place]. He had a syncopal episode earlier in the month and also chest pain with associated dyspnea. He had left heart cath at the [facility] on 05/04/16 that showed 75% mid LAD stenosis and 60% mid RCA stenosis. Left heart cath done yesterday, he received 3.0 x 12 mm Promus Premier DES x 1 mid LAD. He will need to continue his ASA and Plavix for at least one year. He is on high dose statin and beta blocker. Right radial site stable, no hematoma.  2. HTN: Patient with BP's 140-150's/60's. Currently on 12.5 Metoprolol BID.  His heart rate is in the 60's. No room to titrate up beta blocker, MD to advise adding low dose ACEI or [facility]   3. HLD: No labs for review in system, continue high intensity statin.     Signed, Arbutus Leas , NP 8:14 AM 05/18/2016 Pager [phone] Patient seen and examined and history reviewed. Agree with above findings and plan. Doing well post PCI. Feels the best he has in a long time. Does note a mild burning sensation in chest radiating laterally when he takes a deep breath. "like being out in cold air". Will try tylenol. Otherwise he is stable for DC today. Continue ASA and Plavix. Continue high dose statin. I would recommend increasing metoprolol to 25 mg bid for BP.   Peter Martinique, [place] 05/18/2016 8:39 AM

## 2016-05-18 NOTE — Progress Notes (Signed)
CARDIAC REHAB PHASE I   PRE:  Rate/Rhythm: 69 SR PVCs  BP:  Supine:   Sitting: 162/77  Standing:    SaO2:   MODE:  Ambulation: 1000 ft   POST:  Rate/Rhythm: 79 SR PVCs  BP:  Supine:   Sitting: 181/89  Standing:    SaO2:  Came at 0905-0930 to walk with pt and he had no CP. Pt stated he has really hard time remembering things. Told pt I would return when girl friend here. Paged by RN and I returned [phone] to educate. Reviewed stent and importance of plavix. Reviewed NTG use, risk factors, ex ed and diet. Gave low sodium and heart healthy. Discussed CRP 2 and will refer to [place].   Graylon Good, RN BSN  05/18/2016 12:20 PM

## 2016-05-18 NOTE — Progress Notes (Signed)
DC instructions given to patient and S/O. All questions answered. Cardiac rehab to see pt w s/o prior to DC. PIV DC, hemostasis achieved. VSS. CCMD notified of DC. Rx sent to pharmacy. Pt escorted by wheelchair to private vehicle driven by significant other.

## 2016-05-18 NOTE — Discharge Summary (Signed)
Discharge Summary    Patient ID: Michael Singleton,  MRN: [id number], DOB/AGE: 27-Jan-1956 60 y.o.  Admit date: 05/17/2016 Discharge date: 05/18/2016  Primary Care Provider: [facility] Primary Cardiologist: Dr. Angelena Form  Discharge Diagnoses    Active Problems:   Unstable angina [facility])   Coronary artery disease involving native coronary artery of native heart with unstable angina pectoris (HCC)   Allergies Allergies  Allergen Reactions  . Fish Allergy Nausea And Vomiting  . Shellfish Allergy Nausea And Vomiting  . Tape Other (See Comments)    Plastic tape pulls off top layer of skin.  Paper/cloth tape ok    Diagnostic Studies/Procedures  Coronary Stent Intervention Intravascular Pressure Wire/FFR Study Left Heart Cath and Coronary Angiography 05/17/16   Ramus lesion, 20% stenosed.  Prox Cx to Mid Cx lesion, 20% stenosed.  Ost LAD to Prox LAD lesion, 20% stenosed.  Dist LAD lesion, 99% stenosed.  Mid RCA-1 lesion, 50% stenosed.  Prox RCA lesion, 50% stenosed.  Dist RCA lesion, 30% stenosed.  RPDA lesion, 40% stenosed.  Mid LAD lesion, 75% stenosed. Post intervention, there is a 0% residual stenosis. The lesion was not previously treated.  1. Severe stenosis mid LAD.  2. Successful PTCA/DES x 1 mid LAD 3. Moderate stenosis mid RCA. Patent stents ostial and mid RCA.  4. Unstable angina  Recommendations: Continue ASA and Plavix for at least one year. Continue statin and beta blocker.   _____________   History of Present Illness   Michael Singleton is a 60 year old male with a past medical history of CAD, prior cardiac arrest, carotid artery disease, hypertension, hypothyroidism, hyperlipidemia, obstructive sleep apnea, traumatic brain injury, and large B-cell lymphoma. He is normally followed in [place] by the [place], however his stenting to his ostial and mid RCA in August 2014 were done by our service. He had a her syncopal episode earlier in the  month, and also had some ongoing fatigue dyspnea and weakness. He had a left heart cath on 05/04/2016 at the [facility] and was found to have a 75% mid LAD stenosis and a 60% mid RCA stenosis. He contacted our office for consideration of this. He was admitted for planned heart cath on 05/17/2016.  Hospital Course  He underwent left heart cath on 05/17/2016. He was found to have severe stenosis in his mid LAD for which he received one drug-eluting stent. He tolerated the procedure well and his right radial site is stable with no hematoma.  He will continue on aspirin and Plavix for 1 year. He will also continue his high intensity statin. He was moderately hypertensive during admission. We will increase his metoprolol to 25 mg twice a day. He will follow-up with our office and then can follow up with [place] if he wishes.  Of note he does have history of traumatic brain injury, and told cardiac rehabilitation that he has frequent short-term memory loss. His education was done by cardiac rehabilitation with his girlfriend present to help patient remember when he goes home. _____________  Discharge Vitals Blood pressure 144/76, pulse 73, temperature 98 F (36.7 C), temperature source Oral, resp. rate 12, height 5\' 11"  (1.803 m), weight 267 lb 10.2 oz (121.4 kg), SpO2 96 %.  Filed Weights   05/17/16 1004 05/18/16 0416  Weight: 264 lb (119.75 kg) 267 lb 10.2 oz (121.4 kg)    Labs & Radiologic Studies     CBC  Recent Labs  05/18/16 0604  WBC 6.3  HGB 11.9*  HCT 37.8*  MCV 87.5  PLT [id number]   Basic Metabolic Panel  Recent Labs  05/18/16 0604  NA 143  K 4.9  CL 108  CO2 28  GLUCOSE 101*  BUN 7  CREATININE 0.90  CALCIUM 9.1     Disposition   Pt is being discharged home today in good condition.  Follow-up Plans & Appointments    Follow-up Information    Follow up with Ermalinda Barrios, PA-C On 06/05/2016.   Specialty:  Cardiology   Why:  for follow up at 8:15am    Contact information:    [address] [place] [address] [phone]      Discharge Instructions    Diet - low sodium heart healthy    Complete by:  As directed      Discharge instructions    Complete by:  As directed   NO HEAVY LIFTING OR SEXUAL ACTIVITY for 7 DAYS. NO DRIVING for 3-5 DAYS. NO SOAKING BATHS, HOT TUBS, POOLS, ETC., for 7 DAYS  Radial Site Care Refer to this sheet in the next few weeks. These instructions provide you with information on caring for yourself after your procedure. Your caregiver may also give you more specific instructions. Your treatment has been planned according to current medical practices, but problems sometimes occur. Call your caregiver if you have any problems or questions after your procedure. HOME CARE INSTRUCTIONS You may shower the day after the procedure.Remove the bandage (dressing) and gently wash the site with plain soap and water.Gently pat the site dry.  Do not apply powder or lotion to the site.  Do not submerge the affected site in water for 3 to 5 days.  Inspect the site at least twice daily.  Do not flex or bend the affected arm for 24 hours.  No lifting over 5 pounds (2.3 kg) for 5 days after your procedure.  Do not drive home if you are discharged the same day of the procedure. Have someone else drive you.  You may drive 24 hours after the procedure unless otherwise instructed by your caregiver.  What to expect: Any bruising will usually fade within 1 to 2 weeks.  Blood that collects in the tissue (hematoma) may be painful to the touch. It should usually decrease in size and tenderness within 1 to 2 weeks.  SEEK IMMEDIATE MEDICAL CARE IF: You have unusual pain at the radial site.  You have redness, warmth, swelling, or pain at the radial site.  You have drainage (other than a small amount of blood on the dressing).  You have chills.  You have a fever or persistent symptoms for more than 72 hours.  You have a fever and your symptoms  suddenly get worse.  Your arm becomes pale, cool, tingly, or numb.  You have heavy bleeding from the site. Hold pressure on the site.     Increase activity slowly    Complete by:  As directed            Discharge Medications   Current Discharge Medication List    CONTINUE these medications which have CHANGED   Details  metoprolol tartrate (LOPRESSOR) 25 MG tablet Take 1 tablet (25 mg total) by mouth 2 (two) times daily. Qty: 60 tablet, Refills: 12      CONTINUE these medications which have NOT CHANGED   Details  aspirin 81 MG tablet Take 1 tablet (81 mg total) by mouth daily. Qty: 30 tablet    atorvastatin (LIPITOR) 80  MG tablet Take 1 tablet (80 mg total) by mouth daily. Qty: 30 tablet, Refills: 6    buPROPion (WELLBUTRIN XL) 300 MG 24 hr tablet Take 300 mg by mouth daily.    cholecalciferol 2000 UNITS TABS Take 2,000 Units by mouth daily. Qty: 90 tablet, Refills: 3   Associated Diagnoses: Unspecified vitamin D deficiency; NHL (non-Hodgkin's lymphoma) (HCC)    citalopram (CELEXA) 40 MG tablet Take 40 mg by mouth daily.    clopidogrel (PLAVIX) 75 MG tablet Take 1 tablet (75 mg total) by mouth daily with breakfast. Qty: 30 tablet, Refills: 6    gabapentin (NEURONTIN) 300 MG capsule Take 300 mg by mouth 3 (three) times daily.    levothyroxine (SYNTHROID, LEVOTHROID) 150 MCG tablet Take 1 tablet (150 mcg total) by mouth daily before breakfast. Qty: 30 tablet, Refills: 2    Multiple Vitamin (MULTIVITAMIN WITH MINERALS) TABS tablet Take 1 tablet by mouth daily.    nitroGLYCERIN (NITROSTAT) 0.4 MG SL tablet Place 1 tablet (0.4 mg total) under the tongue every 5 (five) minutes x 3 doses as needed for chest pain. Qty: 25 tablet, Refills: 3    pantoprazole (PROTONIX) 40 MG tablet Take 1 tablet (40 mg total) by mouth daily. Qty: 30 tablet, Refills: 6    PRESCRIPTION MEDICATION Apply 1 application topically at bedtime. Cream from [place] for dermatitis    traZODone (DESYREL) 100  MG tablet Take 200 mg by mouth at bedtime.    vitamin E 400 UNIT capsule Take 400 Units by mouth daily.         Aspirin prescribed at discharge? Yes High Intensity Statin Prescribed?Yes Beta Blocker Prescribed? Yes For EF 45% or less, Was ACEI/ARB Prescribed?No, EF was 50-55% in 2014.  ADP Receptor Inhibitor Prescribed? Yes For EF <40%, Aldosterone Inhibitor Prescribed? No  Was EF assessed during THIS hospitalization? No  Was Cardiac Rehab II ordered? Yes  Outstanding Labs/Studies    Duration of Discharge Encounter   Greater than 30 minutes including physician time.  Signed, Arbutus Leas NP 05/18/2016, 10:35 AM

## 2016-05-18 NOTE — Plan of Care (Signed)
Problem: Health Behavior/Discharge Planning: Goal: Ability to safely manage health-related needs after discharge will improve Outcome: Completed/Met Date Met:  05/18/16 Patient has a girlfriend that helps him with his medication and discharge orders will be gone over with her/parients request because of hx of traumatic brain injury

## 2016-05-18 NOTE — Care Management Note (Signed)
Case Management Note  Patient Details  Name: Michael Singleton MRN: [id number] Date of Birth: [birth year]  Subjective/Objective:   Patient is from home, pta indep,  Will be on plavix, No needs.                  Action/Plan:   Expected Discharge Date:                  Expected Discharge Plan:  Home/Self Care  In-House Referral:     Discharge planning Services  CM Consult  Post Acute Care Choice:    Choice offered to:     DME Arranged:    DME Agency:     HH Arranged:    [place] Agency:     Status of Service:  Completed, signed off  Medicare Important Message Given:    Date Medicare IM Given:    Medicare IM give by:    Date Additional Medicare IM Given:    Additional Medicare Important Message give by:     If discussed at [place] of Stay Meetings, dates discussed:    Additional Comments:  Zenon Mayo, RN 05/18/2016, 11:31 AM

## 2016-06-05 ENCOUNTER — Other Ambulatory Visit: Payer: Self-pay | Admitting: *Deleted

## 2016-06-05 ENCOUNTER — Encounter: Payer: Self-pay | Admitting: Physician Assistant

## 2016-06-05 ENCOUNTER — Ambulatory Visit (INDEPENDENT_AMBULATORY_CARE_PROVIDER_SITE_OTHER): Payer: Medicare Other | Admitting: Physician Assistant

## 2016-06-05 VITALS — BP 120/70 | HR 67 | Ht 71.0 in | Wt 265.2 lb

## 2016-06-05 DIAGNOSIS — I2510 Atherosclerotic heart disease of native coronary artery without angina pectoris: Secondary | ICD-10-CM | POA: Diagnosis not present

## 2016-06-05 DIAGNOSIS — I25110 Atherosclerotic heart disease of native coronary artery with unstable angina pectoris: Secondary | ICD-10-CM

## 2016-06-05 DIAGNOSIS — R011 Cardiac murmur, unspecified: Secondary | ICD-10-CM | POA: Diagnosis not present

## 2016-06-05 DIAGNOSIS — I10 Essential (primary) hypertension: Secondary | ICD-10-CM

## 2016-06-05 DIAGNOSIS — R079 Chest pain, unspecified: Secondary | ICD-10-CM

## 2016-06-05 MED ORDER — NITROGLYCERIN 0.4 MG SL SUBL: 0.4000 mg | SUBLINGUAL_TABLET | SUBLINGUAL | Status: AC | PRN

## 2016-06-05 NOTE — Telephone Encounter (Signed)
Rx for nitro was mailed to patients home to be taken to the [place] for refill.

## 2016-06-05 NOTE — Progress Notes (Signed)
Cardiology Office Note    Date:  06/05/2016   ID:  Michael Singleton, DOB 04-22-1956, MRN [id number]  PCP:  [facility]  Cardiologist: Dr. Angelena Form Chief complaint chest pain  History of Present Illness:  Michael Singleton is a 60 y.o. male with history of CAD with stenting, prior cardiac arrest, carotid artery disease, HTN, hypothyroidism, HLD, OSA, large B cell lymphoma. cardiac cath and stenting of the ostial and mid RCA in August 2014. He had been followed in the [place].He had a near syncopal event last week. He has chest pains, dyspnea. Stress test in 2016 abnormal but no cath performed. He uinderwent cath 05/04/16 at the [facility] and was found to have 75% mid LAD stenosis and 60% mid RCA stenosis. He was told he needed PCI of the LAD and FFR of the RCA so he called [place] asking to be seen to discuss PCI. He describes ongoing fatigue, dyspnea and weakness. Chest pains occur several times per week. He has known carotid artery disease, followed in the [place]. Last dopplers May 2017 with 50-69% bilateral stenosis. He underwent successful PTCA/DES 1 to the mid LAD 05/17/16.He had residual nonobstructive CAD and a distal 99% LAD lesion, moderate stenosis of the mid RCA 50% with patent stents in the ostial and mid RCA.  Did some heavy lifting moving furniture and helped turn a mattress about 3 days ago. He went to lay down a pain in left neck down into his whole left chest. More of a soreness. No chest tightness, dyspnea, dizziness or presyncope. Pain was different from his typical angina but eased with 1 SL NTG. Hasn't done anything since. He denies any chest tightness or pressure. Patient always feels like he has to take a deep breath but this has been going on for a long time.    Past Medical History  Diagnosis Date  . Lumbar disc disease     a. with chronic LBP  . Carotid artery disease ([place])     a. Dopplers 01/2012: 60-79% bilat carotid dz;  b. 07/2013 60-79% RICA stenosis & 123456 LICA  stenosis.  . Allergic rhinitis, cause unspecified   . Lumbago   . Male infertility, unspecified   . Posttraumatic stress disorder   . Diverticulosis of colon (without mention of hemorrhage)   . Unspecified hypothyroidism   . Unspecified essential hypertension   . Hyperlipemia   . CAD (coronary artery disease)     a. history of sudden cardiac arrest, s/p MI's in 1995 and 1997 with prior stenting;  b. 07/2013 Cath/PCI: LM nl, LAD 50p/m, D1 nl, LCX nl, RI nl, RCA dominant, 99ost (3.0x20 Promus Premier DES)/86m(3.0x12 Promus Premier DES), EF 55% w/ inf HK.c. 04/2016 3.0 x 12 mm Promus Premier DES x 1 mid LAD   . Closed TBI (traumatic brain injury) ([place]) 1974    "memory issues since" (08/04/2013)  . Heart murmur   . Anxiety   . Depression   . Ventricular bigeminy     a. pseudobradycardia 07/2013 - bigeminy tends to register low on HR monitors.  Marland Kitchen Unspecified vitamin D deficiency 10/12/2013  . Anginal pain ([place])   . Myocardial infarction ([place]) ~ 1995  . OSA (obstructive sleep apnea)     a. not using CPAP (05/17/2016)  . GERD (gastroesophageal reflux disease)   . Migraines     "monthly at least" (05/17/2016)  . Arthritis     "hands, neck, back, hips, knees" (05/17/2016)  . Chronic lower back pain   .  Diffuse large B cell lymphoma (HCC)     a. s/p radical neck dissection. b. followed once a year - last check 09/2012 was OK.  . NHL (non-Hodgkin's lymphoma) ([place]) 10/09/2013  . Falls frequently     "here lately" (05/17/2016)    Past Surgical History  Procedure Laterality Date  . Shoulder arthroscopy w/ rotator cuff repair Right   . Deep neck lymph node biopsy / excision Right 07/2010  . Radical neck dissection Right ~ 09/2010    "'for lymphoma;  (Dr Erik Obey)" (99991111)  . Umbilical hernia repair  ?1990's  . Shoulder arthroscopy w/ rotator cuff repair Left 2000's  . 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 Left heart catheterization with coronary angiogram N/A 08/05/2013    Procedure:  LEFT HEART CATHETERIZATION WITH CORONARY ANGIOGRAM;  Surgeon: [place] Blanks, MD;  Location: [facility] CATH LAB;  Service: Cardiovascular;  Laterality: N/A;  . Tonsillectomy Right ~ 09/2010  . Salivary gland surgery Right ~ 09/2010  . Inguinal hernia repair Left 2013  . Coronary angioplasty with stent placement  1995; 08/05/2013; 05/17/2016    "1 + 2 + 1"   . Cardiac catheterization  05/04/2016  . Cardiac catheterization N/A 05/17/2016    Procedure: Coronary Stent Intervention;  Surgeon: Burnell Blanks, MD;  Location: [place] CV LAB;  Service: Cardiovascular;  Laterality: N/A;  . Cardiac catheterization N/A 05/17/2016    Procedure: Left Heart Cath and Coronary Angiography;  Surgeon: Burnell Blanks, MD;  Location: [place] CV LAB;  Service: Cardiovascular;  Laterality: N/A;  . Cardiac catheterization  05/17/2016    Procedure: Intravascular Pressure Wire/FFR Study;  Surgeon: Burnell Blanks, MD;  Location: [place] CV LAB;  Service: Cardiovascular;;    Current Medications: Outpatient Prescriptions Prior to Visit  Medication Sig Dispense Refill  . aspirin 81 MG tablet Take 1 tablet (81 mg total) by mouth daily. 30 tablet   . atorvastatin (LIPITOR) 80 MG tablet Take 1 tablet (80 mg total) by mouth daily. 30 tablet 6  . buPROPion (WELLBUTRIN XL) 300 MG 24 hr tablet Take 300 mg by mouth daily.    . cholecalciferol 2000 UNITS TABS Take 2,000 Units by mouth daily. 90 tablet 3  . citalopram (CELEXA) 40 MG tablet Take 40 mg by mouth daily.    . clopidogrel (PLAVIX) 75 MG tablet Take 1 tablet (75 mg total) by mouth daily with breakfast. 30 tablet 6  . gabapentin (NEURONTIN) 300 MG capsule Take 300 mg by mouth 3 (three) times daily.    Marland Kitchen levothyroxine (SYNTHROID, LEVOTHROID) 150 MCG tablet Take 1 tablet (150 mcg total) by mouth daily before breakfast. 30 tablet 2  . metoprolol tartrate (LOPRESSOR) 25 MG tablet Take 1 tablet (25 mg total) by mouth 2 (two) times daily. 60  tablet 12  . Multiple Vitamin (MULTIVITAMIN WITH MINERALS) TABS tablet Take 1 tablet by mouth daily.    . nitroGLYCERIN (NITROSTAT) 0.4 MG SL tablet Place 1 tablet (0.4 mg total) under the tongue every 5 (five) minutes x 3 doses as needed for chest pain. 25 tablet 3  . pantoprazole (PROTONIX) 40 MG tablet Take 1 tablet (40 mg total) by mouth daily. 30 tablet 6  . PRESCRIPTION MEDICATION Apply 1 application topically at bedtime. Cream from [place] for dermatitis    . traZODone (DESYREL) 100 MG tablet Take 200 mg by mouth at bedtime.    . vitamin E 400 UNIT capsule Take 400 Units by mouth daily.  No facility-administered medications prior to visit.     Allergies:   Fish allergy; Shellfish allergy; and Tape   Social History   Social History  . Marital Status: Divorced    Spouse Name: N/A  . Number of Children: N/A  . Years of Education: N/A   Occupational History  . Disabled    Social History Main Topics  . Smoking status: Former Smoker -- 1.50 packs/day for 35 years    Types: Cigarettes    Quit date: 04/11/2004  . Smokeless tobacco: Never Used  . Alcohol Use: Yes     Comment: Hx abuse, quit 2005  . Drug Use: Yes    Special: Cocaine     Comment: 05/17/2016 "used all kinds of drugs; stopped in 2004"  . Sexual Activity: Not Currently   Other Topics Concern  . None   Social History Narrative   HSG.  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  Lives with wife. They  lost her son in a motorcycle accident by a drunk driver.  Close to his 93 year old granddaughter.      Family History:  The patient's    family history includes Alcohol abuse in his other; Cancer in his mother; Heart attack in his father; Heart disease in his mother.   ROS:   Please see the history of present illness.    Review of Systems  Constitution: Negative.  HENT: Negative.   Cardiovascular: Positive for chest pain.  Respiratory: Positive for shortness of breath.     Endocrine: Negative.   Hematologic/Lymphatic: Negative.   Musculoskeletal: Negative.   Gastrointestinal: Negative.   Genitourinary: Negative.   Neurological: Negative.   Psychiatric/Behavioral: Positive for memory loss.       Memory loss from traumatic brain injury   All other systems reviewed and are negative.   PHYSICAL EXAM:   VS:  BP 120/70 mmHg  Pulse 67  Ht 5\' 11"  (1.803 m)  Wt 265 lb 3.2 oz (120.294 kg)  BMI 37.00 kg/m2   GEN: Well nourished, well developed, in no acute distress Neck: no JVD, carotid bruits, or masses Cardiac:  RRR; harsh 2 to 3/6 systolic murmur at the left sternal border, no rubs, or gallops,no edema, left arm without hematoma or hemorrhage at cath site good radial and brachial pulses Respiratory:  clear to auscultation bilaterally, normal work of breathing GI: soft, nontender, nondistended, + BS MS: no deformity or atrophy Skin: warm and dry, no rash Neuro:  Alert and Oriented x 3, Strength and sensation are intact Psych: euthymic mood, full affect  Wt Readings from Last 3 Encounters:  06/05/16 265 lb 3.2 oz (120.294 kg)  05/18/16 267 lb 10.2 oz (121.4 kg)  05/14/16 268 lb 12.8 oz (121.927 kg)      Studies/Labs Reviewed:   EKG:  EKG is  ordered today.  The ekg ordered today demonstrates Sinus bradycardia 53 bpm otherwise normal  Recent Labs: 05/18/2016: BUN 7; Creatinine, Ser 0.90; Hemoglobin 11.9*; Platelets 200; Potassium 4.9; Sodium 143   Lipid Panel    Component Value Date/Time   CHOL 274* 08/05/2013 0300   TRIG 277* 08/05/2013 0300   HDL 31* 08/05/2013 0300   CHOLHDL 8.8 08/05/2013 0300   VLDL 55* 08/05/2013 0300   LDLCALC 188* 08/05/2013 0300   LDLDIRECT 83.8 02/20/2011 1205    Additional studies/ records that were reviewed today include:  Coronary Stent Intervention Intravascular Pressure Wire/FFR Study Left Heart Cath and Coronary Angiography  05/17/16     Ramus lesion, 20% stenosed.  Prox Cx to Mid Cx lesion, 20%  stenosed.  Ost LAD to Prox LAD lesion, 20% stenosed.  Dist LAD lesion, 99% stenosed.  Mid RCA-1 lesion, 50% stenosed.  Prox RCA lesion, 50% stenosed.  Dist RCA lesion, 30% stenosed.  RPDA lesion, 40% stenosed.  Mid LAD lesion, 75% stenosed. Post intervention, there is a 0% residual stenosis. The lesion was not previously treated.   1. Severe stenosis mid LAD.   2. Successful PTCA/DES x 1 mid LAD 3. Moderate stenosis mid RCA. Patent stents ostial and mid RCA.   4. Unstable angina  Recommendations: Continue ASA and Plavix for at least one year. Continue statin and beta blocker.          ASSESSMENT:    1. Coronary artery disease involving native coronary artery of native heart without angina pectoris   2. Chest pain, unspecified chest pain type   3. Systolic murmur   4. Essential hypertension      PLAN:  In order of problems listed above:  CAD with recent stenting of the mid LAD with moderate stenosis of the mid RCA and patent stent to the ostial and mid RCA. Continue Plavix and aspirin for at least one year. Follow-up with Dr. Angelena Form in 2-3 months.  Chest pain: Patient had an episode of chest pain after doing heavy lifting that was unlike his prior angina. EKG is unchanged. He is to call if he has any further symptoms.  Systolic murmur consistent with aortic stenosis. Recommend 2-D echo. Patient thinks he has one scheduled at the [place] and would like to call to schedule here if he does not. He has memory issues and needs to talk to his fiance before ordering.  Blood pressure controlled    Medication Adjustments/Labs and Tests Ordered: Current medicines are reviewed at length with the patient today.  Concerns regarding medicines are outlined above.  Medication changes, Labs and Tests ordered today are listed in the Patient Instructions below. Patient Instructions  Medication Instructions:  Your physician recommends that you continue on your current medications as  directed. Please refer to the Current Medication list given to you today.   Labwork: None ordered  Testing/Procedures: Call the office to schedule your echocardiogram if [facility]. If you are going to have your echo with the [place] [facility] please have a copy of the results forwarded to our office. Fax # [phone]  Follow-Up: Your physician recommends that you schedule a follow-up appointment in: 2 months with Dr. Angelena Form   Any Other Special Instructions Will Be Listed Below (If Applicable).     If you need a refill on your cardiac medications before your next appointment, please call your pharmacy.       Sumner Boast, PA-C  06/05/2016 8:47 AM    [facility] [address], [place], [place]  [address] Phone: [phone]; Fax: [phone]

## 2016-06-05 NOTE — Patient Instructions (Signed)
Medication Instructions:  Your physician recommends that you continue on your current medications as directed. Please refer to the Current Medication list given to you today.   Labwork: None ordered  Testing/Procedures: Call the office to schedule your echocardiogram if [facility] is not [facility]. If you are going to have your echo with the [facility] please have a copy of the results forwarded to our office. Fax # [phone]  Follow-Up: Your physician recommends that you schedule a follow-up appointment in: 2 months with Dr. Angelena Form   Any Other Special Instructions Will Be Listed Below (If Applicable).     If you need a refill on your cardiac medications before your next appointment, please call your pharmacy.

## 2016-06-15 ENCOUNTER — Ambulatory Visit: Payer: Non-veteran care | Admitting: Physician Assistant

## 2016-06-19 ENCOUNTER — Telehealth: Payer: Self-pay | Admitting: Cardiovascular Disease

## 2016-06-19 NOTE — Telephone Encounter (Signed)
I placed call to number below which is [facility] cardiology clinic.  I left message on voicemail stating pt's family had contacted our office requesting echo results be sent to [place].  I left our phone number and fax number.

## 2016-06-19 NOTE — Telephone Encounter (Signed)
Calling because  Michael Singleton echo results are in , but we have to call the [facility] in [place] to request a copy of the report. Please call them at [phone] ext [phone]. Any questions please call   Thanks

## 2016-06-29 NOTE — Telephone Encounter (Signed)
Left voicemail at [facility] cardiology clinic with our phone and fax number.  Requested echo results be sent over.

## 2016-07-19 NOTE — Telephone Encounter (Signed)
I spoke with [name] and told her we have left messages at [facility] requesting echo results  be faxed to our office but we had not received them yet.  She will contact [place]. Office fax number given to [place]. I also placed call to [facility] and left message on cardiology voicemail requesting results. Office phone number and fax number left on cardiology voicemail.

## 2016-08-23 ENCOUNTER — Encounter: Payer: Self-pay | Admitting: Cardiovascular Disease

## 2016-08-31 NOTE — Telephone Encounter (Signed)
Please close Encounter °

## 2016-09-10 ENCOUNTER — Ambulatory Visit: Payer: Medicare Other | Admitting: Cardiovascular Disease

## 2017-06-14 ENCOUNTER — Encounter (HOSPITAL_COMMUNITY): Payer: Self-pay

## 2017-06-14 ENCOUNTER — Inpatient Hospital Stay (HOSPITAL_COMMUNITY)
Admission: EM | Admit: 2017-06-14 | Discharge: 2017-06-19 | DRG: 225 | Disposition: A | Discharge status: Home / Self Care | Payer: Non-veteran care | Attending: Cardiology | Admitting: Cardiology

## 2017-06-14 ENCOUNTER — Emergency Department (HOSPITAL_COMMUNITY): Payer: Non-veteran care

## 2017-06-14 DIAGNOSIS — I252 Old myocardial infarction: Secondary | ICD-10-CM

## 2017-06-14 DIAGNOSIS — Z89022 Acquired absence of left finger(s): Secondary | ICD-10-CM

## 2017-06-14 DIAGNOSIS — E785 Hyperlipidemia, unspecified: Secondary | ICD-10-CM | POA: Diagnosis present

## 2017-06-14 DIAGNOSIS — I10 Essential (primary) hypertension: Secondary | ICD-10-CM

## 2017-06-14 DIAGNOSIS — Z7902 Long term (current) use of antithrombotics/antiplatelets: Secondary | ICD-10-CM | POA: Diagnosis not present

## 2017-06-14 DIAGNOSIS — K219 Gastro-esophageal reflux disease without esophagitis: Secondary | ICD-10-CM | POA: Diagnosis present

## 2017-06-14 DIAGNOSIS — Z955 Presence of coronary angioplasty implant and graft: Secondary | ICD-10-CM

## 2017-06-14 DIAGNOSIS — Z95810 Presence of automatic (implantable) cardiac defibrillator: Secondary | ICD-10-CM

## 2017-06-14 DIAGNOSIS — Z79899 Other long term (current) drug therapy: Secondary | ICD-10-CM | POA: Diagnosis not present

## 2017-06-14 DIAGNOSIS — F4310 Post-traumatic stress disorder, unspecified: Secondary | ICD-10-CM | POA: Diagnosis present

## 2017-06-14 DIAGNOSIS — E039 Hypothyroidism, unspecified: Secondary | ICD-10-CM | POA: Diagnosis present

## 2017-06-14 DIAGNOSIS — I2510 Atherosclerotic heart disease of native coronary artery without angina pectoris: Secondary | ICD-10-CM

## 2017-06-14 DIAGNOSIS — R000 Tachycardia, unspecified: Secondary | ICD-10-CM

## 2017-06-14 DIAGNOSIS — Z87891 Personal history of nicotine dependence: Secondary | ICD-10-CM

## 2017-06-14 DIAGNOSIS — Z91013 Allergy to seafood: Secondary | ICD-10-CM | POA: Diagnosis not present

## 2017-06-14 DIAGNOSIS — I255 Ischemic cardiomyopathy: Secondary | ICD-10-CM | POA: Diagnosis present

## 2017-06-14 DIAGNOSIS — G4733 Obstructive sleep apnea (adult) (pediatric): Secondary | ICD-10-CM | POA: Diagnosis not present

## 2017-06-14 DIAGNOSIS — Z923 Personal history of irradiation: Secondary | ICD-10-CM | POA: Diagnosis not present

## 2017-06-14 DIAGNOSIS — Z7982 Long term (current) use of aspirin: Secondary | ICD-10-CM | POA: Diagnosis not present

## 2017-06-14 DIAGNOSIS — Z8249 Family history of ischemic heart disease and other diseases of the circulatory system: Secondary | ICD-10-CM

## 2017-06-14 DIAGNOSIS — C8330 Diffuse large B-cell lymphoma, unspecified site: Secondary | ICD-10-CM | POA: Diagnosis present

## 2017-06-14 DIAGNOSIS — I119 Hypertensive heart disease without heart failure: Secondary | ICD-10-CM | POA: Diagnosis present

## 2017-06-14 DIAGNOSIS — I739 Peripheral vascular disease, unspecified: Secondary | ICD-10-CM | POA: Diagnosis present

## 2017-06-14 DIAGNOSIS — R079 Chest pain, unspecified: Secondary | ICD-10-CM | POA: Diagnosis not present

## 2017-06-14 DIAGNOSIS — I340 Nonrheumatic mitral (valve) insufficiency: Secondary | ICD-10-CM | POA: Diagnosis not present

## 2017-06-14 DIAGNOSIS — I472 Ventricular tachycardia: Principal | ICD-10-CM | POA: Diagnosis present

## 2017-06-14 DIAGNOSIS — Z8674 Personal history of sudden cardiac arrest: Secondary | ICD-10-CM

## 2017-06-14 LAB — CBC
HCT: 35 % — ABNORMAL LOW (ref 39.0–52.0)
Hemoglobin: 11.5 g/dL — ABNORMAL LOW (ref 13.0–17.0)
MCH: 29.6 pg (ref 26.0–34.0)
MCHC: 32.9 g/dL (ref 30.0–36.0)
MCV: 90.2 fL (ref 78.0–100.0)
PLATELETS: 185 10*3/uL (ref 150–400)
RBC: 3.88 MIL/uL — ABNORMAL LOW (ref 4.22–5.81)
RDW: 14.1 % (ref 11.5–15.5)
WBC: 5.8 10*3/uL (ref 4.0–10.5)

## 2017-06-14 LAB — COMPREHENSIVE METABOLIC PANEL
ALBUMIN: 4.1 g/dL (ref 3.5–5.0)
ALK PHOS: 115 U/L (ref 38–126)
ALT: 25 U/L (ref 17–63)
AST: 31 U/L (ref 15–41)
Anion gap: 9 (ref 5–15)
BILIRUBIN TOTAL: 0.5 mg/dL (ref 0.3–1.2)
BUN: 7 mg/dL (ref 6–20)
CO2: 25 mmol/L (ref 22–32)
Calcium: 9.7 mg/dL (ref 8.9–10.3)
Chloride: 104 mmol/L (ref 101–111)
Creatinine, Ser: 0.96 mg/dL (ref 0.61–1.24)
GFR calc Af Amer: >60 mL/min (ref >60)
GFR calc non Af Amer: >60 mL/min (ref >60)
Glucose, Bld: 114 mg/dL — ABNORMAL HIGH (ref 65–99)
POTASSIUM: 4.3 mmol/L (ref 3.5–5.1)
SODIUM: 138 mmol/L (ref 135–145)
Total Protein: 6.8 g/dL (ref 6.5–8.1)

## 2017-06-14 LAB — CBC WITH DIFFERENTIAL/PLATELET
Basophils Absolute: 0 10*3/uL (ref 0.0–0.1)
Basophils Relative: 0 %
EOS PCT: 3 %
Eosinophils Absolute: 0.2 10*3/uL (ref 0.0–0.7)
HCT: 40.2 % (ref 39.0–52.0)
HEMOGLOBIN: 13.3 g/dL (ref 13.0–17.0)
LYMPHS PCT: 27 %
Lymphs Abs: 1.8 10*3/uL (ref 0.7–4.0)
MCH: 30 pg (ref 26.0–34.0)
MCHC: 33.1 g/dL (ref 30.0–36.0)
MCV: 90.7 fL (ref 78.0–100.0)
MONOS PCT: 8 %
Monocytes Absolute: 0.5 10*3/uL (ref 0.1–1.0)
Neutro Abs: 4.1 10*3/uL (ref 1.7–7.7)
Neutrophils Relative %: 62 %
Platelets: 205 10*3/uL (ref 150–400)
RBC: 4.43 MIL/uL (ref 4.22–5.81)
RDW: 14.1 % (ref 11.5–15.5)
WBC: 6.6 10*3/uL (ref 4.0–10.5)

## 2017-06-14 LAB — BASIC METABOLIC PANEL
Anion gap: 6 (ref 5–15)
BUN: 6 mg/dL (ref 6–20)
CO2: 23 mmol/L (ref 22–32)
CREATININE: 0.79 mg/dL (ref 0.61–1.24)
Calcium: 7.9 mg/dL — ABNORMAL LOW (ref 8.9–10.3)
Chloride: 112 mmol/L — ABNORMAL HIGH (ref 101–111)
GFR calc Af Amer: >60 mL/min (ref >60)
GFR calc non Af Amer: >60 mL/min (ref >60)
GLUCOSE: 86 mg/dL (ref 65–99)
POTASSIUM: 3.3 mmol/L — AB (ref 3.5–5.1)
Sodium: 141 mmol/L (ref 135–145)

## 2017-06-14 LAB — BRAIN NATRIURETIC PEPTIDE: B NATRIURETIC PEPTIDE 5: 186.8 pg/mL — AB (ref 0.0–100.0)

## 2017-06-14 LAB — I-STAT TROPONIN, ED: Troponin i, poc: 0.02 ng/mL (ref 0.00–0.08)

## 2017-06-14 LAB — MAGNESIUM: Magnesium: 2.1 mg/dL (ref 1.7–2.4)

## 2017-06-14 LAB — TSH: TSH: 62.224 u[IU]/mL — ABNORMAL HIGH (ref 0.350–4.500)

## 2017-06-14 MED ORDER — CLOPIDOGREL BISULFATE 75 MG PO TABS
75.0000 mg | ORAL_TABLET | Freq: Every day | ORAL | Status: DC
  Administered 2017-06-15 – 2017-06-17 (×3): 75 mg via ORAL
  Filled 2017-06-14 (×3): qty 1

## 2017-06-14 MED ORDER — GABAPENTIN 300 MG PO CAPS
300.0000 mg | ORAL_CAPSULE | Freq: Three times a day (TID) | ORAL | Status: DC
  Administered 2017-06-15 – 2017-06-19 (×8): 300 mg via ORAL
  Filled 2017-06-14 (×10): qty 1

## 2017-06-14 MED ORDER — HEPARIN BOLUS VIA INFUSION
4000.0000 [IU] | Freq: Once | INTRAVENOUS | Status: AC
  Administered 2017-06-14: 4000 [IU] via INTRAVENOUS
  Filled 2017-06-14: qty 4000

## 2017-06-14 MED ORDER — PANTOPRAZOLE SODIUM 40 MG PO TBEC
40.0000 mg | DELAYED_RELEASE_TABLET | Freq: Every day | ORAL | Status: DC
  Administered 2017-06-15 – 2017-06-19 (×5): 40 mg via ORAL
  Filled 2017-06-14 (×5): qty 1

## 2017-06-14 MED ORDER — HEPARIN (PORCINE) IN NACL 100-0.45 UNIT/ML-% IJ SOLN
1600.0000 [IU]/h | INTRAMUSCULAR | Status: DC
  Administered 2017-06-15: 1600 [IU]/h via INTRAVENOUS
  Filled 2017-06-14 (×2): qty 250

## 2017-06-14 MED ORDER — CITALOPRAM HYDROBROMIDE 20 MG PO TABS
40.0000 mg | ORAL_TABLET | Freq: Every day | ORAL | Status: DC
  Administered 2017-06-15 – 2017-06-19 (×5): 40 mg via ORAL
  Filled 2017-06-14 (×5): qty 2

## 2017-06-14 MED ORDER — POTASSIUM CHLORIDE CRYS ER 20 MEQ PO TBCR
40.0000 meq | EXTENDED_RELEASE_TABLET | Freq: Once | ORAL | Status: AC
  Administered 2017-06-14: 40 meq via ORAL
  Filled 2017-06-14: qty 2

## 2017-06-14 MED ORDER — AMIODARONE HCL IN DEXTROSE 360-4.14 MG/200ML-% IV SOLN: 30.0000 mg/h | INTRAVENOUS | Status: DC

## 2017-06-14 MED ORDER — ONDANSETRON HCL 4 MG/2ML IJ SOLN: 4.0000 mg | Freq: Four times a day (QID) | INTRAMUSCULAR | Status: DC | PRN

## 2017-06-14 MED ORDER — LEVOTHYROXINE SODIUM 75 MCG PO TABS
150.0000 ug | ORAL_TABLET | Freq: Every day | ORAL | Status: DC
  Administered 2017-06-15 – 2017-06-19 (×5): 150 ug via ORAL
  Filled 2017-06-14 (×5): qty 2

## 2017-06-14 MED ORDER — TRAZODONE HCL 100 MG PO TABS
200.0000 mg | ORAL_TABLET | Freq: Every day | ORAL | Status: DC
  Administered 2017-06-14 – 2017-06-18 (×5): 200 mg via ORAL
  Filled 2017-06-14 (×4): qty 1
  Filled 2017-06-14: qty 2

## 2017-06-14 MED ORDER — ASPIRIN 300 MG RE SUPP: 300.0000 mg | RECTAL | Status: AC

## 2017-06-14 MED ORDER — NITROGLYCERIN 0.4 MG SL SUBL: 0.4000 mg | SUBLINGUAL_TABLET | SUBLINGUAL | Status: DC | PRN

## 2017-06-14 MED ORDER — ALPRAZOLAM 0.25 MG PO TABS
0.2500 mg | ORAL_TABLET | Freq: Two times a day (BID) | ORAL | Status: DC | PRN
  Administered 2017-06-15: 0.25 mg via ORAL
  Filled 2017-06-14: qty 1

## 2017-06-14 MED ORDER — ASPIRIN EC 81 MG PO TBEC: 81.0000 mg | DELAYED_RELEASE_TABLET | Freq: Every day | ORAL | Status: DC

## 2017-06-14 MED ORDER — AMIODARONE LOAD VIA INFUSION
150.0000 mg | Freq: Once | INTRAVENOUS | Status: AC
  Administered 2017-06-14: 150 mg via INTRAVENOUS
  Filled 2017-06-14: qty 83.34

## 2017-06-14 MED ORDER — AMIODARONE HCL IN DEXTROSE 360-4.14 MG/200ML-% IV SOLN: 60.0000 mg/h | INTRAVENOUS | Status: DC

## 2017-06-14 MED ORDER — AMIODARONE HCL IN DEXTROSE 360-4.14 MG/200ML-% IV SOLN
60.0000 mg/h | INTRAVENOUS | Status: AC
  Administered 2017-06-14: 60 mg/h via INTRAVENOUS
  Filled 2017-06-14 (×2): qty 200

## 2017-06-14 MED ORDER — AMIODARONE LOAD VIA INFUSION
150.0000 mg | Freq: Once | INTRAVENOUS | Status: DC
  Filled 2017-06-14: qty 83.34

## 2017-06-14 MED ORDER — ACETAMINOPHEN 325 MG PO TABS
650.0000 mg | ORAL_TABLET | ORAL | Status: DC | PRN
  Administered 2017-06-15 – 2017-06-17 (×2): 650 mg via ORAL
  Filled 2017-06-14 (×3): qty 2

## 2017-06-14 MED ORDER — ASPIRIN 81 MG PO CHEW: 324.0000 mg | CHEWABLE_TABLET | ORAL | Status: AC

## 2017-06-14 MED ORDER — METOPROLOL TARTRATE 25 MG PO TABS
25.0000 mg | ORAL_TABLET | Freq: Two times a day (BID) | ORAL | Status: DC
  Administered 2017-06-16 – 2017-06-19 (×4): 25 mg via ORAL
  Filled 2017-06-14 (×6): qty 1

## 2017-06-14 MED ORDER — ATORVASTATIN CALCIUM 80 MG PO TABS
80.0000 mg | ORAL_TABLET | Freq: Every day | ORAL | Status: DC
  Administered 2017-06-14 – 2017-06-19 (×6): 80 mg via ORAL
  Filled 2017-06-14 (×6): qty 1

## 2017-06-14 MED ORDER — ASPIRIN EC 81 MG PO TBEC
81.0000 mg | DELAYED_RELEASE_TABLET | Freq: Every day | ORAL | Status: DC
  Administered 2017-06-15 – 2017-06-16 (×2): 81 mg via ORAL
  Filled 2017-06-14 (×3): qty 1

## 2017-06-14 MED ORDER — BUPROPION HCL ER (XL) 300 MG PO TB24
300.0000 mg | ORAL_TABLET | Freq: Every day | ORAL | Status: DC
  Administered 2017-06-15 – 2017-06-19 (×5): 300 mg via ORAL
  Filled 2017-06-14 (×2): qty 2
  Filled 2017-06-14: qty 1
  Filled 2017-06-14 (×2): qty 2

## 2017-06-14 NOTE — Progress Notes (Signed)
[place] for heparin Indication: chest pain/ACS  Allergies  Allergen Reactions  . Fish Allergy Nausea And Vomiting  . Shellfish Allergy Nausea And Vomiting  . Tape Other (See Comments)    Plastic tape pulls off top layer of skin.  Paper/cloth tape ok    Patient Measurements: Height: 6' (182.9 cm) Weight: 271 lb (122.9 kg) IBW/kg (Calculated) : 77.6 Heparin Dosing Weight: 104 kg  Vital Signs: BP: 128/70 (06/22 1930) Pulse Rate: 52 (06/22 1930)  Labs:  Recent Labs  06/14/17 1606  HGB 11.5*  HCT 35.0*  PLT 185  CREATININE 0.79    Estimated Creatinine Clearance: 131.3 mL/min (by C-G formula based on SCr of 0.79 mg/dL).  Assessment: 61 yo male to start heparin for ACS workup. No known a/c PTA. CBC stable.  Goal of Therapy:  Heparin level 0.3-0.7 units/ml Monitor platelets by anticoagulation protocol: Yes   Plan:  1. Give 4000 units bolus x 1 2. Start heparin infusion at 1600 units/hr 3. Check anti-Xa level in 8 hours and daily while on heparin 4. Continue to monitor H&H and platelets  Vincenza Hews, PharmD, BCPS 06/14/2017, 8:27 PM

## 2017-06-14 NOTE — ED Triage Notes (Signed)
Pt states he has been having fatigue on exertion for the past month. Today pt was walking with dog started have mid sternum chest pain with left arm numbness. Pt had chest pain at 9/10 and took 2 nitros before ems arrived. Ems arrived gave 324 asa and was having SVT and when arrived to ED it converted back to sinus rhythm. No chest pain at this time.

## 2017-06-14 NOTE — ED Provider Notes (Signed)
[facility] DEPT Provider Note   CSN: [id number] Arrival date & time: 06/14/17  1547     History   Chief Complaint Chief Complaint  Patient presents with  . Chest Pain    HPI Michael Singleton is a 61 y.o. male.CC:  Chest pain, weakness, tachyarrhythmia-converted  HPI:  61 year old male with significant past cardiac history including multiple previous MI, and PTCA procedures. Per cardiology note 1 prior cardiac arrest.  Patient reports awakening at 8 AM. He was standing outside watching his dog a few minutes later. He felt substernal chest pain rating to his left arm. He felt extremely weak. He states he was "soaking wet". He went back inside. He states that he has been dysfunctional through most of the day with similar episodes although they have come and gone. He is not subjectively aware of any rapid heart rate. Ultimately his pain and symptoms worsened. He called 911. Paramedics noted him to be in a wide-complex tachycardic rhythm. She describes a "strange sudden feeling in my chest". And he converted to a narrow complex sinus based rhythm.  Per old chart has history of PTCA, prior cardiac arrest, carotid artery disease, hypertension, hypothyroidism, hyperlipidemia, sleep apnea, B cell lymphoma.  Follows at cardiology with [facility], as well as [facility] cardiology. Last procedure was 04/2016 a stenting of his  LAD, and RCA.   Past Medical History:  Diagnosis Date  . Allergic rhinitis, cause unspecified   . Anginal pain ([place])   . Anxiety   . Arthritis    "hands, neck, back, hips, knees" (05/17/2016)  . CAD (coronary artery disease)    a. history of sudden cardiac arrest, s/p MI's in 1995 and 1997 with prior stenting;  b. 07/2013 Cath/PCI: LM nl, LAD 50p/m, D1 nl, LCX nl, RI nl, RCA dominant, 99ost (3.0x20 Promus Premier DES)/34m(3.0x12 Promus Premier DES), EF 55% w/ inf HK.c. 04/2016 3.0 x 12 mm Promus Premier DES x 1 mid LAD   . Carotid artery disease  ([place])    a. Dopplers 01/2012: 60-79% bilat carotid dz;  b. 07/2013 60-79% RICA stenosis & 18-29% LICA stenosis.  . Chronic lower back pain   . Closed TBI (traumatic brain injury) ([place]) 1974   "memory issues since" (08/04/2013)  . Depression   . 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 s/p radical neck dissection. b. followed once a year - last check 09/2012 was OK.  . Diverticulosis of colon (without mention of hemorrhage)   . Falls frequently    "here lately" (05/17/2016)  . GERD (gastroesophageal reflux disease)   . Heart murmur   . Hyperlipemia   . Lumbago   . Lumbar disc disease    a. with chronic LBP  . Male infertility, unspecified   . Migraines    "monthly at least" (05/17/2016)  . Myocardial infarction ([place]) ~ 1995  . NHL (non-Hodgkin's lymphoma) ([place]) 10/09/2013  . OSA (obstructive sleep apnea)    a. not using CPAP (05/17/2016)  . Posttraumatic stress disorder   . Unspecified essential hypertension   . Unspecified hypothyroidism   . Unspecified vitamin D deficiency 10/12/2013  . Ventricular bigeminy    a. pseudobradycardia 07/2013 - bigeminy tends to register low on HR monitors.    Patient Active Problem List   Diagnosis Date Noted  . Chest pain 06/05/2016  . Systolic murmur 93/71/6967  . Unspecified vitamin D deficiency 10/12/2013  . NHL (non-Hodgkin's lymphoma) ([place]) 10/09/2013  . Dizziness 08/12/2013  . Fatigue  08/12/2013  . Unstable angina ([place]) 08/06/2013  . CAD (coronary artery disease)   . Intermediate coronary syndrome ([place]) 08/04/2013  . Ventricular bigeminy 08/04/2013  . Hypogonadism, male 06/28/2011  . LYMPHOMA, HEAD/NECK/FACE 11/01/2010  . POSTTRAUMATIC STRESS DISORDER 09/04/2010  . INGUINAL HERNIA, LEFT 09/04/2010  . [place] DISEASE, LUMBAR 09/04/2010  . OCCLUSION&STENOS CAROTID ART W/O MENTION INFARCT 05/31/2010  . ALLERGIC RHINITIS CAUSE UNSPECIFIED 12/19/2009  . ELBOW PAIN, RIGHT 03/08/2009  . KNEE PAIN, BILATERAL 03/08/2009  . POLYARTHRITIS  03/08/2009  . LOW BACK PAIN 03/08/2009  . MALE INFERTILITY 05/06/2008  . HYPOTHYROIDISM 02/02/2008  . OBSTRUCTIVE SLEEP APNEA 02/02/2008  . MYOCARDIAL INFARCTION, HX OF 02/02/2008  . DIVERTICULOSIS, COLON 02/02/2008  . HYPERLIPIDEMIA 07/23/2007  . Essential hypertension 07/23/2007  . CORONARY ARTERY DISEASE 07/23/2007    Past Surgical History:  Procedure Laterality Date  . CARDIAC CATHETERIZATION  05/04/2016  . CARDIAC CATHETERIZATION N/A 05/17/2016   Procedure: Coronary Stent Intervention;  Surgeon: Burnell Blanks, MD;  Location: [facility] CV LAB;  Service: Cardiovascular;  Laterality: N/A;  . CARDIAC CATHETERIZATION N/A 05/17/2016   Procedure: Left Heart Cath and Coronary Angiography;  Surgeon: Burnell Blanks, MD;  Location: [facility] CV LAB;  Service: Cardiovascular;  Laterality: N/A;  . CARDIAC CATHETERIZATION  05/17/2016   Procedure: Intravascular Pressure Wire/FFR Study;  Surgeon: Burnell Blanks, MD;  Location: [facility] CV LAB;  Service: Cardiovascular;;  . [place]; 08/05/2013; 05/17/2016   "1 + 2 + 1"   . DEEP NECK LYMPH NODE BIOPSY / EXCISION Right 07/2010  . FINGER AMPUTATION Left 1980's   thumb  . INGUINAL HERNIA REPAIR Left 2013  . LEFT HEART CATHETERIZATION WITH CORONARY ANGIOGRAM N/A 08/05/2013   Procedure: LEFT HEART CATHETERIZATION WITH CORONARY ANGIOGRAM;  Surgeon: Burnell Blanks, MD;  Location: [facility] CATH LAB;  Service: Cardiovascular;  Laterality: N/A;  . RADICAL NECK DISSECTION Right ~ 09/2010   "'for lymphoma;  (Dr Erik Obey)" (6/64/4034)  . SALIVARY GLAND SURGERY Right ~ 09/2010  . SHOULDER ARTHROSCOPY W/ ROTATOR CUFF REPAIR Right   . SHOULDER ARTHROSCOPY W/ ROTATOR CUFF REPAIR Left 2000's  . TONSILLECTOMY Right ~ 09/2010  . UMBILICAL HERNIA REPAIR  ?1990's       Home Medications    Prior to Admission medications   Medication Sig Start Date End Date Taking? Authorizing Provider  aspirin  81 MG tablet Take 1 tablet (81 mg total) by mouth daily. 08/06/13  Yes Rogelia Mire, NP  atorvastatin (LIPITOR) 80 MG tablet Take 1 tablet (80 mg total) by mouth daily. 08/06/13  Yes Rogelia Mire, NP  buPROPion (WELLBUTRIN XL) 300 MG 24 hr tablet Take 300 mg by mouth daily.   Yes [provider]  cholecalciferol 2000 UNITS TABS Take 2,000 Units by mouth daily. 10/12/13  Yes Gorsuch, Ni, MD  citalopram (CELEXA) 40 MG tablet Take 40 mg by mouth daily.   Yes [provider]  clopidogrel (PLAVIX) 75 MG tablet Take 1 tablet (75 mg total) by mouth daily with breakfast. 08/06/13  Yes Rogelia Mire, NP  levothyroxine (SYNTHROID, LEVOTHROID) 150 MCG tablet Take 1 tablet (150 mcg total) by mouth daily before breakfast. 08/06/13  Yes Rogelia Mire, NP  metoprolol tartrate (LOPRESSOR) 25 MG tablet Take 1 tablet (25 mg total) by mouth 2 (two) times daily. 05/18/16  Yes Arbutus Leas, NP  Multiple Vitamin (MULTIVITAMIN WITH MINERALS) TABS tablet Take 1 tablet by mouth daily.   Yes [provider]  nitroGLYCERIN (NITROSTAT) 0.4 MG SL tablet Place 1 tablet (0.4 mg total) under the tongue every 5 (five) minutes x 3 doses as needed for chest pain. 06/05/16  Yes Imogene Burn, PA-C  pantoprazole (PROTONIX) 40 MG tablet Take 1 tablet (40 mg total) by mouth daily. 08/14/13  Yes Rogelia Mire, NP  POTASSIUM CHLORIDE PO Take 1 tablet by mouth daily.   Yes [provider]  PRESCRIPTION MEDICATION Apply 1 application topically at bedtime. Cream from [place] for dermatitis   Yes [provider]  PRESCRIPTION MEDICATION Place 1 drop into both eyes daily as needed (itchy eyes).   Yes [provider]  traZODone (DESYREL) 100 MG tablet Take 200 mg by mouth at bedtime.   Yes [provider]  gabapentin (NEURONTIN) 300 MG capsule Take 300 mg by mouth 3 (three) times daily.    [provider]    Family History Family History    Problem Relation Age of Onset  . Heart attack Father        3 heart attacks   . Cancer Mother   . Heart disease Mother   . Alcohol abuse Other     Social History Social History  Substance Use Topics  . Smoking status: Former Smoker    Packs/day: 1.50    Years: 35.00    Types: Cigarettes    Quit date: 04/11/2004  . Smokeless tobacco: Never Used  . Alcohol use Yes     Comment: Hx abuse, quit 2005     Allergies   Fish allergy; Shellfish allergy; and Tape   Review of Systems Review of Systems  Constitutional: Positive for diaphoresis and fatigue. Negative for appetite change, chills and fever.  HENT: Negative for mouth sores, sore throat and trouble swallowing.   Eyes: Negative for visual disturbance.  Respiratory: Positive for chest tightness and shortness of breath. Negative for cough and wheezing.   Cardiovascular: Positive for chest pain.  Gastrointestinal: Negative for abdominal distention, abdominal pain, diarrhea, nausea and vomiting.  Endocrine: Negative for polydipsia, polyphagia and polyuria.  Genitourinary: Negative for dysuria, frequency and hematuria.  Musculoskeletal: Negative for gait problem.  Skin: Negative for color change, pallor and rash.  Neurological: Positive for weakness. Negative for dizziness, syncope, light-headedness and headaches.  Hematological: Does not bruise/bleed easily.  Psychiatric/Behavioral: Negative for behavioral problems and confusion.     Physical Exam Updated Vital Signs BP 108/65   Pulse (!) 57   Resp 16   Ht 6' (1.829 m)   Wt 122.9 kg (271 lb)   SpO2 96%   BMI 36.75 kg/m   Physical Exam  Constitutional: He is oriented to person, place, and time. He appears well-developed and well-nourished. No distress.  Awake and alert. Currently subjectively symptom-free. Not diaphoretic.  HENT:  Head: Normocephalic.  Eyes: Conjunctivae are normal. Pupils are equal, round, and reactive to light. No scleral icterus.  Neck: Normal  range of motion. Neck supple. No thyromegaly present.  Cardiovascular: Normal rate and regular rhythm.  Exam reveals no gallop and no friction rub.   No murmur heard. Pulmonary/Chest: Effort normal and breath sounds normal. No respiratory distress. He has no wheezes. He has no rales.  Abdominal: Soft. Bowel sounds are normal. He exhibits no distension. There is no tenderness. There is no rebound.  Musculoskeletal: Normal range of motion.  Neurological: He is alert and oriented to person, place, and time.  Skin: Skin is warm and dry. No rash noted.  Psychiatric: He has  a normal mood and affect. His behavior is normal.     ED Treatments / Results  Labs (all labs ordered are listed, but only abnormal results are displayed) Labs Reviewed  BASIC METABOLIC PANEL - Abnormal; Notable for the following:       Result Value   Potassium 3.3 (*)    Chloride 112 (*)    Calcium 7.9 (*)    All other components within normal limits  CBC - Abnormal; Notable for the following:    RBC 3.88 (*)    Hemoglobin 11.5 (*)    HCT 35.0 (*)    All other components within normal limits  I-STAT TROPOININ, ED    EKG  EKG Interpretation  Date/Time:  Friday June 14 2017 15:48:22 EDT Ventricular Rate:  67 PR Interval:    QRS Duration: 100 QT Interval:  371 QTC Calculation: 392 R Axis:   69 Text Interpretation:  Sinus rhythm Multiple ventricular premature complexes Borderline repolarization abnormality Confirmed by Tanna Furry [phone]) on 06/14/2017 3:51:43 PM       Radiology No results found.  Procedures Procedures (including critical care time)  Medications Ordered in ED Medications  amiodarone (NEXTERONE) 1.8 mg/mL load via infusion 150 mg (not administered)    Followed by  amiodarone (NEXTERONE PREMIX) 360-4.14 MG/200ML-% (1.8 mg/mL) IV infusion (not administered)    Followed by  amiodarone (NEXTERONE PREMIX) 360-4.14 MG/200ML-% (1.8 mg/mL) IV infusion (not administered)  amiodarone  (NEXTERONE) 1.8 mg/mL load via infusion 150 mg (not administered)  potassium chloride SA (K-DUR,KLOR-CON) CR tablet 40 mEq (not administered)     Initial Impression / Assessment and Plan / ED Course  I have reviewed the triage vital signs and the nursing notes.  Pertinent labs & imaging results that were available during my care of the patient were reviewed by me and considered in my medical decision making (see chart for details).   EKG taken prehospital while symptomatic shows wide complex tachycardia. Appearance favors SVT with aberrancy. There is notching of upsloping and downslope.However, patient is high risk for ventricular tachyarrhythmias with multiple cardiac history and echo showing inferior apical hypokinesis. Will load with amiodarone as patient with history of cardiac arrest. Pt given 150mg  iv load, then infusion protocol started.  17:00:  Reexamined. Pt pain free. Was given 324 ASA by EMS. Cardiologist with patient.  Patient unable to provide details of previous cardiac arrest due to history of traumatic brain injury. Awaiting arrival of family for further details.  Final Clinical Impressions(s) / ED Diagnoses   Final diagnoses:  Wide-complex tachycardia ([place])    CRITICAL CARE Performed by: Tanna Furry JOSEPH   Total critical care time: 30 minutes  Critical care time was exclusive of separately billable procedures and treating other patients.  Critical care was necessary to treat or prevent imminent or life-threatening deterioration.  Critical care was time spent personally by me on the following activities: development of treatment plan with patient and/or surrogate as well as nursing, discussions with consultants, evaluation of patient's response to treatment, examination of patient, obtaining history from patient or surrogate, ordering and performing treatments and interventions, ordering and review of laboratory studies, ordering and review of radiographic studies,  pulse oximetry and re-evaluation of patient's condition.   New Prescriptions New Prescriptions   No medications on file     Tanna Furry, MD 06/14/17 1734

## 2017-06-14 NOTE — H&P (Signed)
History & Physical    Patient ID: Chais Fehringer MRN: [id number], DOB/AGE: 1956/10/08   Admit date: 06/14/2017   Primary Physician: [facility] Primary Cardiologist: Dr. Angelena Form  Patient Profile    Mr. 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 He presents after an episode of chest pain, SOB, diaphoresis, and fatigue. Vtach noted with EMS that spontaneously converted to NSR.  Past Medical History    Past Medical History:  Diagnosis Date  . Allergic rhinitis, cause unspecified   . Anginal pain ([place])   . Anxiety   . Arthritis    "hands, neck, back, hips, knees" (05/17/2016)  . CAD (coronary artery disease)    a. history of sudden cardiac arrest, s/p MI's in 1995 and 1997 with prior stenting;  b. 07/2013 Cath/PCI: LM nl, LAD 50p/m, D1 nl, LCX nl, RI nl, RCA dominant, 99ost (3.0x20 Promus Premier DES)/41m(3.0x12 Promus Premier DES), EF 55% w/ inf HK.c. 04/2016 3.0 x 12 mm Promus Premier DES x 1 mid LAD   . Carotid artery disease ([place])    a. Dopplers 01/2012: 60-79% bilat carotid dz;  b. 07/2013 60-79% RICA stenosis & 25-63% LICA stenosis.  . Chronic lower back pain   . Closed TBI (traumatic brain injury) ([place]) 1974   "memory issues since" (08/04/2013)  . Depression   . 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 s/p radical neck dissection. b. followed once a year - last check 09/2012 was OK.  . Diverticulosis of colon (without mention of hemorrhage)   . Falls frequently    "here lately" (05/17/2016)  . GERD (gastroesophageal reflux disease)   . Heart murmur   . Hyperlipemia   . Lumbago   . Lumbar disc disease    a. with chronic LBP  . Male infertility, unspecified   . Migraines    "monthly at least" (05/17/2016)  . Myocardial infarction ([place]) ~ 1995  . NHL (non-Hodgkin's  lymphoma) ([place]) 10/09/2013  . OSA (obstructive sleep apnea)    a. not using CPAP (05/17/2016)  . Posttraumatic stress disorder   . Unspecified essential hypertension   . Unspecified hypothyroidism   . Unspecified vitamin D deficiency 10/12/2013  . Ventricular bigeminy    a. pseudobradycardia 07/2013 - bigeminy tends to register low on HR monitors.    Past Surgical History:  Procedure Laterality Date  . CARDIAC CATHETERIZATION  05/04/2016  . CARDIAC CATHETERIZATION N/A 05/17/2016   Procedure: Coronary Stent Intervention;  Surgeon: Burnell Blanks, MD;  Location: [place] [facility];  Service: Cardiovascular;  Laterality: N/A;  . CARDIAC CATHETERIZATION N/A 05/17/2016   Procedure: Left Heart Cath and Coronary Angiography;  Surgeon: Burnell Blanks, MD;  Location: [place] [facility];  Service: Cardiovascular;  Laterality: N/A;  . CARDIAC CATHETERIZATION  05/17/2016   Procedure: Intravascular Pressure Wire/FFR Study;  Surgeon: Burnell Blanks, MD;  Location: [place] [facility];  Service: Cardiovascular;;  . [place]; 08/05/2013; 05/17/2016   "1 + 2 + 1"   . DEEP NECK LYMPH NODE BIOPSY / EXCISION Right 07/2010  . FINGER AMPUTATION Left 1980's   thumb  . INGUINAL HERNIA REPAIR Left 2013  . LEFT HEART CATHETERIZATION WITH CORONARY ANGIOGRAM N/A 08/05/2013   Procedure: LEFT HEART CATHETERIZATION WITH CORONARY ANGIOGRAM;  Surgeon: Harrell Gave  Santina Evans, MD;  Location: [place] [facility];  Service: Cardiovascular;  Laterality: N/A;  . RADICAL NECK DISSECTION Right ~ 09/2010   "'for lymphoma;  (Dr Erik Obey)" (5/36/6440)  . SALIVARY GLAND SURGERY Right ~ 09/2010  . SHOULDER ARTHROSCOPY W/ ROTATOR CUFF REPAIR Right   . SHOULDER ARTHROSCOPY W/ ROTATOR CUFF REPAIR Left 2000's  . TONSILLECTOMY Right ~ 09/2010  . UMBILICAL HERNIA REPAIR  ?1990's     Allergies  Allergies  Allergen Reactions  . Fish Allergy Nausea And Vomiting  . Shellfish Allergy  Nausea And Vomiting  . Tape Other (See Comments)    Plastic tape pulls off top layer of skin.  Paper/cloth tape ok    History of Present Illness    Mr. Huguley is known to our service and last saw Estella Husk PA-C in clinic on 06/05/16.  He has a history of sudden cardiac arrest and is s/p MI in 1995 and 1997 with prior PCI. He had a left heart cath 07/2013 with stengin to the ostial and mid RCA. [facility]. He underwent repeat cath 05/04/16 at [facility] with 75% mid LAD stenosis and 60% mid RCA stenosis. No PCI at that time. He underwent successful PTCA/DES 1 to the mid LAD 05/17/16.He had residual nonobstructive CAD and a distal 99% LAD lesion, moderate stenosis of the mid RCA 50% with patent stents in the ostial and mid RCA.  He reports a history of DOE, but denies orthopnea, lower extremity edema, and chest pain with exertion. With exertion he gets tired and SOB. Today, he felt bad after walking his dog. He became suddenly tired, SOB, and chest pain. The chest pain was left-sided, described as a chest heaviness that radiated to his left arm. He was cold and clammy with associated diaphoresis. He denies palpitations. He became dizzy and sat down. He did not have a syncopal episode. This episode started at 12:30 this afternoon. EMS was called at 1430. EMS noted VTach on the way [facility], which converted to NSR spontaneously. Currently, he denies chest pain/heaviness, SOB, and fatigue. Symptoms have largely resolved on our interview.  He quit smoking in 2006. He does not drink alcohol. He is allergic to shellfish but has not had a reaction to dye/contrast.  Rhythm strips from EMS with Vtach and EKG confirms conversion to NSR with PVCs.   Home Medications    Prior to Admission medications   Medication Sig Start Date End Date Taking? Authorizing Provider  aspirin 81 MG tablet Take 1 tablet (81 mg total) by mouth daily. 08/06/13   Rogelia Mire, NP  atorvastatin (LIPITOR) 80 MG tablet  Take 1 tablet (80 mg total) by mouth daily. 08/06/13   Rogelia Mire, NP  buPROPion (WELLBUTRIN XL) 300 MG 24 hr tablet Take 300 mg by mouth daily.    [provider]  cholecalciferol 2000 UNITS TABS Take 2,000 Units by mouth daily. 10/12/13   Heath Lark, MD  citalopram (CELEXA) 40 MG tablet Take 40 mg by mouth daily.    [provider]  clopidogrel (PLAVIX) 75 MG tablet Take 1 tablet (75 mg total) by mouth daily with breakfast. 08/06/13   Rogelia Mire, NP  gabapentin (NEURONTIN) 300 MG capsule Take 300 mg by mouth 3 (three) times daily.    [provider]  levothyroxine (SYNTHROID, LEVOTHROID) 150 MCG tablet Take 1 tablet (150 mcg total) by mouth daily before breakfast. 08/06/13   Rogelia Mire, NP  metoprolol tartrate (LOPRESSOR) 25  MG tablet Take 1 tablet (25 mg total) by mouth 2 (two) times daily. 05/18/16   Arbutus Leas, NP  Multiple Vitamin (MULTIVITAMIN WITH MINERALS) TABS tablet Take 1 tablet by mouth daily.    [provider]  nitroGLYCERIN (NITROSTAT) 0.4 MG SL tablet Place 1 tablet (0.4 mg total) under the tongue every 5 (five) minutes x 3 doses as needed for chest pain. 06/05/16   Imogene Burn, PA-C  pantoprazole (PROTONIX) 40 MG tablet Take 1 tablet (40 mg total) by mouth daily. 08/14/13   Rogelia Mire, NP  PRESCRIPTION MEDICATION Apply 1 application topically at bedtime. Cream from [place] for dermatitis    [provider]  traZODone (DESYREL) 100 MG tablet Take 200 mg by mouth at bedtime.    [provider]    Family History    Family History  Problem Relation Age of Onset  . Heart attack Father        3 heart attacks   . Cancer Mother   . Heart disease Mother   . Alcohol abuse Other     Social History    Social History   Social History  . Marital status: Divorced    Spouse name: N/A  . Number of children: N/A  . Years of education: N/A   Occupational History  . Disabled    Social  History Main Topics  . Smoking status: Former Smoker    Packs/day: 1.50    Years: 35.00    Types: Cigarettes    Quit date: 04/11/2004  . Smokeless tobacco: Never Used  . Alcohol use Yes     Comment: Hx abuse, quit 2005  . Drug use: Yes    Types: Cocaine     Comment: 05/17/2016 "used all kinds of drugs; stopped in 2004"  . Sexual activity: Not Currently   Other Topics Concern  . 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  Lives with wife. They  lost her son in a motorcycle accident by a drunk driver.  Close to his 16 year old granddaughter.      Review of Systems    General:  No chills, fever, night sweats or weight changes.  Cardiovascular:  No chest pain, dyspnea on exertion, edema, orthopnea, palpitations, paroxysmal nocturnal dyspnea. Dermatological: No rash, lesions/masses Respiratory: No cough, dyspnea Urologic: No hematuria, dysuria Abdominal:   No nausea, vomiting, diarrhea, bright red blood per rectum, melena, or hematemesis Neurologic:  No visual changes, wkns, changes in mental status. All other systems reviewed and are otherwise negative except as noted above.  Physical Exam    Blood pressure 127/78, pulse 62, resp. rate 12, height 6' (1.829 m), weight 271 lb (122.9 kg), SpO2 97 %.  General: Pleasant, NAD Psych: Normal affect. Neuro: Alert and oriented X 3. Moves all extremities spontaneously. HEENT: Normal  Neck: Supple without bruits or JVD. Lungs:  Resp regular and unlabored, CTA. Heart: RRR no s3, s4, or murmurs. Abdomen: Soft, non-tender, non-distended, BS + x 4.  Extremities: No clubbing, cyanosis or edema. DP/PT/Radials 2+ and equal bilaterally.  Labs    Troponin [facility] Test)  Recent Labs  06/14/17 1613  TROPIPOC 0.02   No results for input(s): CKTOTAL, CKMB, TROPONINI in the last 72 hours. Lab Results  Component Value Date   WBC 6.3 05/18/2016    HGB 11.9 (L) 05/18/2016   HCT 37.8 (L)  05/18/2016   MCV 87.5 05/18/2016   PLT 200 05/18/2016   No results for input(s): NA, K, CL, CO2, BUN, CREATININE, CALCIUM, PROT, BILITOT, ALKPHOS, ALT, AST, GLUCOSE in the last 168 hours.  Invalid input(s): LABALBU Lab Results  Component Value Date   CHOL 274 (H) 08/05/2013   HDL 31 (L) 08/05/2013   LDLCALC 188 (H) 08/05/2013   TRIG 277 (H) 08/05/2013   No results found for: [facility]   Radiology Studies    No results found.  ECG & Cardiac Imaging    Rhythm strips from EMS with VTach. EKG confirms NSR with PVCs  LHC 05/17/16:  [place] lesion, 20% stenosed.  Prox Cx to Mid Cx lesion, 20% stenosed.  Ost LAD to Prox LAD lesion, 20% stenosed.  Dist LAD lesion, 99% stenosed.  Mid RCA-1 lesion, 50% stenosed.  Prox RCA lesion, 50% stenosed.  Dist RCA lesion, 30% stenosed.  RPDA lesion, 40% stenosed.  Mid LAD lesion, 75% stenosed. Post intervention, there is a 0% residual stenosis. The lesion was not previously treated.   1. Severe stenosis mid LAD.  2. Successful PTCA/DES x 1 mid LAD 3. Moderate stenosis mid RCA. Patent stents ostial and mid RCA.  4. Unstable angina  Recommendations: Continue ASA and Plavix for at least one year. Continue statin and beta blocker.    Echocardiogram 08/05/13 Study Conclusions - Left ventricle: Inferobasal hypokinesis The cavity size was normal. Systolic function was normal. The estimated ejection fraction was in the range of 50% to 55%. Wall motion was normal; there were no regional wall motion abnormalities. - Left atrium: The atrium was mildly dilated.   Left heart cath 08/05/13 Procedure Performed:  1. Left Heart Catheterization 2. Selective Coronary Angiography 3. Left ventricular angiogram 4.   PTCA/DES x 1 ostial RCA 5.   PTCA/DES x 1 mid RCA 6.   Angioseal RFA  Assessment & Plan    1. Chest pain, CAD      Signed, Ledora Bottcher, PA-C 06/14/2017, 4:34 PM  As  above, patient seen and examined. Briefly he is a 61 year old male with past medical history of coronary artery disease, carotid artery disease, hypertension, hyperlipidemia, prior large B-cell lymphoma, obstructive sleep apnea with ventricular tachycardia. Patient does have dyspnea on exertion at baseline but denies orthopnea, PND, pedal edema, exertional chest pain, palpitations or syncope. Today after walking his dog he developed sudden weakness, dizziness, dyspnea and chest heaviness. The chest pain radiated to his left upper extremity. He did not have syncope. He sat down and his symptoms continued for 2-1/2 hours and did not resolve. EMS was called and let echocardiogram showed probable ventricular tachycardia at a rate of 174. He broke in route to sinus rhythm with PVCs.  ECG-ventricular tachycardia FU ECG-NSR, PVC, NSST Laboratories show potassium 3.3, hemoglobin 11.5. Initial troponin 0.02.  1 ventricular tachycardia-patient presents with VT. He is now in sinus rhythm. Will add IV amiodarone and continue metoprolol. Will supplement potassium. Check magnesium. We will admit to CCU for close monitoring. Check echocardiogram for LV function. Plan cardiac catheterization Monday to exclude progressive coronary disease. I have discussed the patient with Dr. Curt Bears and EP will consult. Cycle enzymes. Continue ASA and add heparin until enzymes back.  2 coronary artery disease-continue aspirin, statin and beta blocker. Cycle enzymes as outlined above. Cardiac catheterization on Monday.   3 hyperlipidemia-continue statin.  4 hypertension continue preadmission blood pressure medications.  Kirk Ruths, MD

## 2017-06-15 ENCOUNTER — Inpatient Hospital Stay (HOSPITAL_COMMUNITY): Payer: Non-veteran care

## 2017-06-15 ENCOUNTER — Encounter (HOSPITAL_COMMUNITY): Payer: Self-pay | Admitting: *Deleted

## 2017-06-15 DIAGNOSIS — I200 Unstable angina: Secondary | ICD-10-CM

## 2017-06-15 DIAGNOSIS — I214 Non-ST elevation (NSTEMI) myocardial infarction: Secondary | ICD-10-CM

## 2017-06-15 DIAGNOSIS — I340 Nonrheumatic mitral (valve) insufficiency: Secondary | ICD-10-CM

## 2017-06-15 DIAGNOSIS — E7800 Pure hypercholesterolemia, unspecified: Secondary | ICD-10-CM

## 2017-06-15 LAB — ECHOCARDIOGRAM COMPLETE
AO mean calculated velocity dopler: 154 cm/s
AOPV: 0.4 m/s
AV Area VTI index: 0.52 cm2/m2
AV Area VTI: 1.25 cm2
AV Mean grad: 11 mmHg
AV Peak grad: 19 mmHg
AV VEL mean LVOT/AV: 0.38
AV pk vel: 218 cm/s
AVAREAMEANV: 1.2 cm2
AVAREAMEANVIN: 0.47 cm2/m2
AVLVOTPG: 3 mmHg
CHL CUP AV PEAK INDEX: 0.49
CHL CUP AV VALUE AREA INDEX: 0.52
CHL CUP AV VEL: 1.35
CHL CUP STROKE VOLUME: 44 mL
E decel time: 274 msec
EERAT: 6.51
FS: 24 % — AB (ref 28–44)
HEIGHTINCHES: 72 in
IV/PV OW: 0.93
LA diam index: 1.67 cm/m2
LA vol index: 28.3 mL/m2
LASIZE: 43 mm
LAVOL: 72.9 mL
LAVOLA4C: 76.9 mL
LDCA: 3.14 cm2
LEFT ATRIUM END SYS DIAM: 43 mm
LV E/e' medial: 6.51
LV E/e'average: 6.51
LV dias vol index: 42 mL/m2
LV dias vol: 109 mL (ref 62–150)
LV e' LATERAL: 11 cm/s
LV sys vol index: 25 mL/m2
LV sys vol: 65 mL — AB (ref 21–61)
LVOT SV: 74 mL
LVOT VTI: 23.6 cm
LVOT diameter: 20 mm
LVOT peak VTI: 0.43 cm
LVOTPV: 86.8 cm/s
Lateral S' vel: 12 cm/s
MV Dec: 274
MV Peak grad: 2 mmHg
MVPKAVEL: 68.6 m/s
MVPKEVEL: 71.6 m/s
P 1/2 time: 670 ms
PW: 11.3 mm — AB (ref 0.6–1.1)
Simpson's disk: 41
TAPSE: 23.8 mm
TDI e' lateral: 11
TDI e' medial: 7.4
VTI: 54.8 cm
Valve area: 1.35 cm2
WEIGHTICAEL: 4440.95 [oz_av]

## 2017-06-15 LAB — MRSA PCR SCREENING: MRSA BY PCR: NEGATIVE

## 2017-06-15 LAB — CBC
HCT: 37.9 % — ABNORMAL LOW (ref 39.0–52.0)
Hemoglobin: 12.4 g/dL — ABNORMAL LOW (ref 13.0–17.0)
MCH: 29.7 pg (ref 26.0–34.0)
MCHC: 32.7 g/dL (ref 30.0–36.0)
MCV: 90.7 fL (ref 78.0–100.0)
PLATELETS: 180 10*3/uL (ref 150–400)
RBC: 4.18 MIL/uL — AB (ref 4.22–5.81)
RDW: 14.2 % (ref 11.5–15.5)
WBC: 6.7 10*3/uL (ref 4.0–10.5)

## 2017-06-15 LAB — BASIC METABOLIC PANEL
Anion gap: 8 (ref 5–15)
BUN: 8 mg/dL (ref 6–20)
CALCIUM: 9.3 mg/dL (ref 8.9–10.3)
CHLORIDE: 106 mmol/L (ref 101–111)
CO2: 26 mmol/L (ref 22–32)
CREATININE: 1.07 mg/dL (ref 0.61–1.24)
Glucose, Bld: 97 mg/dL (ref 65–99)
Potassium: 4.2 mmol/L (ref 3.5–5.1)
SODIUM: 140 mmol/L (ref 135–145)

## 2017-06-15 LAB — HEPARIN LEVEL (UNFRACTIONATED)
HEPARIN UNFRACTIONATED: 0.69 [IU]/mL (ref 0.30–0.70)
Heparin Unfractionated: 1.08 IU/mL — ABNORMAL HIGH (ref 0.30–0.70)
Heparin Unfractionated: 0.78 IU/mL — ABNORMAL HIGH (ref 0.30–0.70)

## 2017-06-15 LAB — TROPONIN I
TROPONIN I: 0.06 ng/mL — AB (ref <0.03)
Troponin I: 0.1 ng/mL (ref <0.03)
Troponin I: 0.07 ng/mL (ref <0.03)

## 2017-06-15 MED ORDER — HEPARIN (PORCINE) IN NACL 100-0.45 UNIT/ML-% IJ SOLN: 1400.0000 [IU]/h | INTRAMUSCULAR | Status: DC

## 2017-06-15 MED ORDER — HEPARIN (PORCINE) IN NACL 100-0.45 UNIT/ML-% IJ SOLN
1100.0000 [IU]/h | INTRAMUSCULAR | Status: DC
  Administered 2017-06-16 – 2017-06-17 (×2): 1100 [IU]/h via INTRAVENOUS
  Filled 2017-06-15 (×2): qty 250

## 2017-06-15 NOTE — Consult Note (Signed)
EP Consultation:   Patient ID: Michael Singleton; [id number]; 21-Aug-1956   Admit date: 06/14/2017 Date of Consult: 06/15/2017  Primary Care Provider: [facility] Primary Cardiologist: Julianne Handler Primary Electrophysiologist:  New   Patient Profile:   Michael Singleton is a 61 y.o. male with a hx of CAD who is being seen today for the evaluation of VT at the request of Dr. Stanford Breed.  History of Present Illness:   Michael Singleton is referred today by Dr. Stanford Breed for evaluation of a wide QRS tachycardia presumed to be VT. He has an extensive cardiac history including A cardiac arrest in 1995 when he presented with a myocardial infarction and again in 1997. He underwent heart catheterization in 2014 with tight RCA stenosis which was treated with stenting. He underwent catheterization in May 2017 with a 75% LAD stenosis with no intervention at that time. A repeat cath 2 weeks later resulted in angioplasty of the LAD. The patient was in his usual state of health yesterday when he developed shortness of breath while walking the dog associated with a cold clammy sweat with diaphoresis and palpitations. He did not have syncope. EMS was called and the initial telemetry strip was described as ventricular tachycardia though I had not been able to review this personally. He converted spontaneously and his symptoms improved. The patient was admitted to the hospital and begun on intravenous amiodarone. He is pending cardiac catheterization. He has not had any recent episodes of similar symptoms. He denies syncope. Previously his ejection fraction was 50%. He denies anginal symptoms and medical noncompliance.  Past Medical History:  Diagnosis Date  . Allergic rhinitis, cause unspecified   . Anginal pain ([place])   . Anxiety   . Arthritis    "hands, neck, back, hips, knees" (05/17/2016)  . CAD (coronary artery disease)    a. history of sudden cardiac arrest, s/p MI's in 1995 and 1997 with prior stenting;  b.  07/2013 Cath/PCI: LM nl, LAD 50p/m, D1 nl, LCX nl, RI nl, RCA dominant, 99ost (3.0x20 Promus Premier DES)/62m(3.0x12 Promus Premier DES), EF 55% w/ inf HK.c. 04/2016 3.0 x 12 mm Promus Premier DES x 1 mid LAD   . Carotid artery disease ([place])    a. Dopplers 01/2012: 60-79% bilat carotid dz;  b. 07/2013 60-79% RICA stenosis & 08-65% LICA stenosis.  . Chronic lower back pain   . Closed TBI (traumatic brain injury) ([place]) 1974   "memory issues since" (08/04/2013)  . Depression   . 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 s/p radical neck dissection. b. followed once a year - last check 09/2012 was OK.  . Diverticulosis of colon (without mention of hemorrhage)   . Falls frequently    "here lately" (05/17/2016)  . GERD (gastroesophageal reflux disease)   . Heart murmur   . Hyperlipemia   . Lumbago   . Lumbar disc disease    a. with chronic LBP  . Male infertility, unspecified   . Migraines    "monthly at least" (05/17/2016)  . Myocardial infarction ([place]) ~ 1995  . NHL (non-Hodgkin's lymphoma) ([place]) 10/09/2013  . OSA (obstructive sleep apnea)    a. not using CPAP (05/17/2016)  . Posttraumatic stress disorder   . Unspecified essential hypertension   . Unspecified hypothyroidism   . Unspecified vitamin D deficiency 10/12/2013  . Ventricular bigeminy    a. pseudobradycardia 07/2013 - bigeminy tends to register low on HR monitors.    Past Surgical History:  Procedure Laterality Date  .  CARDIAC CATHETERIZATION  05/04/2016  . CARDIAC CATHETERIZATION N/A 05/17/2016   Procedure: Coronary Stent Intervention;  Surgeon: Burnell Blanks, MD;  Location: [place] [facility];  Service: Cardiovascular;  Laterality: N/A;  . CARDIAC CATHETERIZATION N/A 05/17/2016   Procedure: Left Heart Cath and Coronary Angiography;  Surgeon: Burnell Blanks, MD;  Location: [place] [facility] LAB;  Service: Cardiovascular;  Laterality: N/A;  . CARDIAC CATHETERIZATION  05/17/2016   Procedure: Intravascular Pressure  Wire/FFR Study;  Surgeon: Burnell Blanks, MD;  Location: [place] [facility] LAB;  Service: Cardiovascular;;  . [place]; 08/05/2013; 05/17/2016   "1 + 2 + 1"   . DEEP NECK LYMPH NODE BIOPSY / EXCISION Right 07/2010  . FINGER AMPUTATION Left 1980's   thumb  . INGUINAL HERNIA REPAIR Left 2013  . LEFT HEART CATHETERIZATION WITH CORONARY ANGIOGRAM N/A 08/05/2013   Procedure: LEFT HEART CATHETERIZATION WITH CORONARY ANGIOGRAM;  Surgeon: Burnell Blanks, MD;  Location: [facility] CATH LAB;  Service: Cardiovascular;  Laterality: N/A;  . RADICAL NECK DISSECTION Right ~ 09/2010   "'for lymphoma;  (Dr Erik Obey)" (1/61/0960)  . SALIVARY GLAND SURGERY Right ~ 09/2010  . SHOULDER ARTHROSCOPY W/ ROTATOR CUFF REPAIR Right   . SHOULDER ARTHROSCOPY W/ ROTATOR CUFF REPAIR Left 2000's  . TONSILLECTOMY Right ~ 09/2010  . UMBILICAL HERNIA REPAIR  ?1990's     Inpatient Medications: Scheduled Meds: . aspirin  324 mg Oral NOW   Or  . aspirin  300 mg Rectal NOW  . aspirin EC  81 mg Oral Daily  . atorvastatin  80 mg Oral Daily  . buPROPion  300 mg Oral Daily  . citalopram  40 mg Oral Daily  . clopidogrel  75 mg Oral Q breakfast  . gabapentin  300 mg Oral TID  . levothyroxine  150 mcg Oral QAC breakfast  . metoprolol tartrate  25 mg Oral BID  . pantoprazole  40 mg Oral Daily  . traZODone  200 mg Oral QHS   Continuous Infusions: . amiodarone 30 mg/hr (06/15/17 0015)  . heparin 1,600 Units/hr (06/14/17 2030)   PRN Meds: acetaminophen, ALPRAZolam, nitroGLYCERIN, ondansetron (ZOFRAN) IV  Allergies:    Allergies  Allergen Reactions  . Fish Allergy Nausea And Vomiting  . Shellfish Allergy Nausea And Vomiting  . Tape Other (See Comments)    Plastic tape pulls off top layer of skin.  Paper/cloth tape ok    Social History:   Social History   Social History  . Marital status: Divorced    Spouse name: N/A  . Number of children: N/A  . Years of education: N/A     Occupational History  . Disabled    Social History Main Topics  . Smoking status: Former Smoker    Packs/day: 1.50    Years: 35.00    Types: Cigarettes    Quit date: 04/11/2004  . Smokeless tobacco: Never Used  . Alcohol use Yes     Comment: Hx abuse, quit 2005  . Drug use: Yes    Types: Cocaine     Comment: 05/17/2016 "used all kinds of drugs; stopped in 2004"  . Sexual activity: Not Currently   Other Topics Concern  . 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  Lives with wife. They  lost her son in a motorcycle accident by a drunk driver.  Close to his 2  year old granddaughter.     Family History:   The patient's family history includes Alcohol abuse in his other; Cancer in his mother; Heart attack in his father; Heart disease in his mother.  ROS:  Please see the history of present illness.  ROS  All other ROS reviewed and negative.     Physical Exam/Data:   Vitals:   06/15/17 0406 06/15/17 0500 06/15/17 0600 06/15/17 0700  BP:  108/69 (!) 110/53   Pulse:  (!) 46 (!) 45   Resp:  (!) 0 10   Temp: 97.4 F (36.3 C)   97.5 F (36.4 C)  TempSrc: Oral   Oral  SpO2:  98% 98%   Weight:      Height:        Intake/Output Summary (Last 24 hours) at 06/15/17 0922 Last data filed at 06/15/17 0600  Gross per 24 hour  Intake          1091.86 ml  Output             1625 ml  Net          -533.14 ml   Filed Weights   06/14/17 1551 06/15/17 0000  Weight: 271 lb (122.9 kg) 277 lb 9 oz (125.9 kg)   Body mass index is 37.64 kg/m.  General:  Well nourished, well developed, in no acute distress HEENT: Changes consistent with extensive surgery on the right side of his neck with removal of muscles  Lymph: no adenopathy Neck: 7 cm on left JVD Endocrine:  No thryomegaly Vascular: No carotid bruits; FA pulses 2+ bilaterally without bruits  Cardiac:  normal S1, S2; RRR; no murmur   Lungs:  clear to auscultation bilaterally, no wheezing, rhonchi or rales  Abd: soft, nontender, no hepatomegaly  Ext: no edema Musculoskeletal:  No deformities, BUE and BLE strength normal and equal Skin: warm and dry  Neuro:  CNs 2-12 intact, no focal abnormalities noted Psych:  Normal affect   EKG:  The EKG was personally reviewed and demonstrates:  Normal sinus rhythm with nonspecific ST T wave abnormality  Telemetry:  Telemetry was personally reviewed and demonstrates:  Normal sinus rhythm  Relevant CV Studies: 2-D echo is pending  Laboratory Data:  Chemistry Recent Labs Lab 06/14/17 1606 06/14/17 2106 06/15/17 0218  NA 141 138 140  K 3.3* 4.3 4.2  CL 112* 104 106  CO2 23 25 26   GLUCOSE 86 114* 97  BUN 6 7 8   CREATININE 0.79 0.96 1.07  CALCIUM 7.9* 9.7 9.3  GFRNONAA >60 >60 >60  GFRAA >60 >60 >60  ANIONGAP 6 9 8      Recent Labs Lab 06/14/17 2106  PROT 6.8  ALBUMIN 4.1  AST 31  ALT 25  ALKPHOS 115  BILITOT 0.5   Hematology Recent Labs Lab 06/14/17 1606 06/14/17 2106 06/15/17 0218  WBC 5.8 6.6 6.7  RBC 3.88* 4.43 4.18*  HGB 11.5* 13.3 12.4*  HCT 35.0* 40.2 37.9*  MCV 90.2 90.7 90.7  MCH 29.6 30.0 29.7  MCHC 32.9 33.1 32.7  RDW 14.1 14.1 14.2  PLT 185 205 180   Cardiac Enzymes Recent Labs Lab 06/15/17 0743  TROPONINI 0.10*    Recent Labs Lab 06/14/17 1613  TROPIPOC 0.02    BNP Recent Labs Lab 06/14/17 2106  BNP 186.8*    DDimer No results for input(s): DDIMER in the last 168 hours.  Radiology/Studies:  Dg Chest 2 View  Result Date: 06/14/2017 CLINICAL DATA:  Left-sided chest pain EXAM:  CHEST  2 VIEW COMPARISON:  Chest radiograph 08/04/2013 FINDINGS: Mild cardiomegaly. Lungs are clear. No pleural effusion or pneumothorax. IMPRESSION: No active cardiopulmonary disease.  Unchanged mild cardiomegaly. Electronically Signed   By: Ulyses Jarred M.D.   On: 06/14/2017 17:43    Assessment and Plan:   1. Wide QRS tachycardia - this most  likely represents ventricular tachycardia. Following catheterization, I would suggest EP study and if he has inducible SVT consider catheter ablation. If he has inducible VT, or is not inducible, ICD implantation for hemodynamically unstable ventricular tachycardia would be recommended. We will hold his amiodarone at this time. 2. Coronary artery disease - his troponins are minimally increased and this does not represent an acute coronary syndrome. Progressive coronary disease however could be responsible for his tachycardia and would need to be treated if appropriate. 3. Dyslipidemia - he will continue his statin therapy 4. Hypertensive heart disease - continue medical therapy.   Signed, Cristopher Peru, MD  06/15/2017 9:22 AM

## 2017-06-15 NOTE — Progress Notes (Signed)
  Echocardiogram 2D Echocardiogram has been performed.  Johny Chess 06/15/2017, 9:21 AM

## 2017-06-15 NOTE — Progress Notes (Signed)
ANTICOAGULATION CONSULT NOTE - Follow Up Consult  Pharmacy Consult for Heparin Indication: chest pain/ACS  Allergies  Allergen Reactions  . Fish Allergy Nausea And Vomiting  . Shellfish Allergy Nausea And Vomiting  . Tape Other (See Comments)    Plastic tape pulls off top layer of skin.  Paper/cloth tape ok    Patient Measurements: Height: 6' (182.9 cm) Weight: 277 lb 9 oz (125.9 kg) IBW/kg (Calculated) : 77.6 Heparin Dosing Weight: 104kg  Vital Signs: Temp: 98.4 F (36.9 C) (06/23 1100) Temp Source: Oral (06/23 1100) BP: 139/70 (06/23 1200) Pulse Rate: 55 (06/23 1200)  Labs:  Recent Labs  06/14/17 1606 06/14/17 2106 06/15/17 0218 06/15/17 0743 06/15/17 1232  HGB 11.5* 13.3 12.4*  --   --   HCT 35.0* 40.2 37.9*  --   --   PLT 185 205 180  --   --   HEPARINUNFRC  --   --  0.69  --  1.08*  CREATININE 0.79 0.96 1.07  --   --   TROPONINI  --   --   --  0.10* 0.07*    Estimated Creatinine Clearance: 99.4 mL/min (by C-G formula based on SCr of 1.07 mg/dL).   Medications:  Heparin @ 1600 units/hr  Assessment: 60yom continues on heparin for NSTEMI with plan for cath Monday. Confirmatory heparin level is supratherapeutic at 1.08. CBC is stable. No bleeding.  Goal of Therapy:  Heparin level 0.3-0.7 units/ml Monitor platelets by anticoagulation protocol: Yes   Plan:  1) Reduce heparin to 1400 units/hr 2) Check 6 hour heparin level  Deboraha Sprang 06/15/2017,2:00 PM

## 2017-06-15 NOTE — Progress Notes (Signed)
ANTICOAGULATION CONSULT NOTE - Follow Up Consult  Pharmacy Consult for Heparin  Indication: chest pain/ACS  Allergies  Allergen Reactions  . Fish Allergy Nausea And Vomiting  . Shellfish Allergy Nausea And Vomiting  . Tape Other (See Comments)    Plastic tape pulls off top layer of skin.  Paper/cloth tape ok    Patient Measurements: Height: 6' (182.9 cm) Weight: 277 lb 9 oz (125.9 kg) IBW/kg (Calculated) : 77.6  Vital Signs: Temp: 98.5 F (36.9 C) (06/22 2348) Temp Source: Oral (06/22 2348) BP: 115/58 (06/23 0200) Pulse Rate: 43 (06/23 0200)  Labs:  Recent Labs  06/14/17 1606 06/14/17 2106 06/15/17 0218  HGB 11.5* 13.3 12.4*  HCT 35.0* 40.2 37.9*  PLT 185 205 180  HEPARINUNFRC  --   --  0.69  CREATININE 0.79 0.96 1.07    Estimated Creatinine Clearance: 99.4 mL/min (by C-G formula based on SCr of 1.07 mg/dL).  Assessment: 61 y/o M on heparin for CP, plan for cath Monday, initial heparin level is therapeutic   Goal of Therapy:  Heparin level 0.3-0.7 units/ml Monitor platelets by anticoagulation protocol: Yes   Plan:  -Cont heparin 1600 units/hr -1200 HL  Michael Singleton 06/15/2017,3:48 AM

## 2017-06-15 NOTE — Progress Notes (Signed)
Progress Note  Patient Name: Michael Singleton Date of Encounter: 06/15/2017  Primary Cardiologist: Dr. Julianne Handler  Subjective   Feeling well.  Denies chest pain or shortness of breath. No recurrent palpitations.  Inpatient Medications    Scheduled Meds: . aspirin  324 mg Oral NOW   Or  . aspirin  300 mg Rectal NOW  . aspirin EC  81 mg Oral Daily  . atorvastatin  80 mg Oral Daily  . buPROPion  300 mg Oral Daily  . citalopram  40 mg Oral Daily  . clopidogrel  75 mg Oral Q breakfast  . gabapentin  300 mg Oral TID  . levothyroxine  150 mcg Oral QAC breakfast  . metoprolol tartrate  25 mg Oral BID  . pantoprazole  40 mg Oral Daily  . traZODone  200 mg Oral QHS   Continuous Infusions: . amiodarone 30 mg/hr (06/15/17 0015)  . heparin 1,600 Units/hr (06/14/17 2030)   PRN Meds: acetaminophen, ALPRAZolam, nitroGLYCERIN, ondansetron (ZOFRAN) IV   Vital Signs    Vitals:   06/15/17 0406 06/15/17 0500 06/15/17 0600 06/15/17 0700  BP:  108/69 (!) 110/53   Pulse:  (!) 46 (!) 45   Resp:  (!) 0 10   Temp: 97.4 F (36.3 C)   97.5 F (36.4 C)  TempSrc: Oral   Oral  SpO2:  98% 98%   Weight:      Height:        Intake/Output Summary (Last 24 hours) at 06/15/17 0852 Last data filed at 06/15/17 0600  Gross per 24 hour  Intake          1091.86 ml  Output             1625 ml  Net          -533.14 ml   Filed Weights   06/14/17 1551 06/15/17 0000  Weight: 122.9 kg (271 lb) 125.9 kg (277 lb 9 oz)    Telemetry    PVCs, ventricular trigeminy.  Ventricular couplets. - Personally Reviewed  ECG    Sinus bradycardia. Rate 52 bpm.  Cannot rule out prior inferior infarct.  - Personally Reviewed  Physical Exam   GEN: No acute distress.   Neck: No JVD Cardiac: RRR, no murmurs, rubs, or gallops.  Respiratory: Clear to auscultation bilaterally. GI: Soft, nontender, non-distended  MS: No edema; No deformity. Neuro:  Nonfocal  Psych: Normal affect   Labs    Chemistry Recent  Labs Lab 06/14/17 1606 06/14/17 2106 06/15/17 0218  NA 141 138 140  K 3.3* 4.3 4.2  CL 112* 104 106  CO2 23 25 26   GLUCOSE 86 114* 97  BUN 6 7 8   CREATININE 0.79 0.96 1.07  CALCIUM 7.9* 9.7 9.3  PROT  --  6.8  --   ALBUMIN  --  4.1  --   AST  --  31  --   ALT  --  25  --   ALKPHOS  --  115  --   BILITOT  --  0.5  --   GFRNONAA >60 >60 >60  GFRAA >60 >60 >60  ANIONGAP 6 9 8      Hematology Recent Labs Lab 06/14/17 1606 06/14/17 2106 06/15/17 0218  WBC 5.8 6.6 6.7  RBC 3.88* 4.43 4.18*  HGB 11.5* 13.3 12.4*  HCT 35.0* 40.2 37.9*  MCV 90.2 90.7 90.7  MCH 29.6 30.0 29.7  MCHC 32.9 33.1 32.7  RDW 14.1 14.1 14.2  PLT 185 205 180  Cardiac Enzymes Recent Labs Lab 06/15/17 0743  TROPONINI 0.10*    Recent Labs Lab 06/14/17 1613  TROPIPOC 0.02     BNP Recent Labs Lab 06/14/17 2106  BNP 186.8*     DDimer No results for input(s): DDIMER in the last 168 hours.   Radiology    Dg Chest 2 View  Result Date: 06/14/2017 CLINICAL DATA:  Left-sided chest pain EXAM: CHEST  2 VIEW COMPARISON:  Chest radiograph 08/04/2013 FINDINGS: Mild cardiomegaly. Lungs are clear. No pleural effusion or pneumothorax. IMPRESSION: No active cardiopulmonary disease.  Unchanged mild cardiomegaly. Electronically Signed   By: Ulyses Jarred M.D.   On: 06/14/2017 17:43    Cardiac Studies   Rhythm strips from EMS with VTach. EKG confirms NSR with PVCs  LHC 05/17/16:  Ramus lesion, 20% stenosed.  Prox Cx to Mid Cx lesion, 20% stenosed.  Ost LAD to Prox LAD lesion, 20% stenosed.  Dist LAD lesion, 99% stenosed.  Mid RCA-1 lesion, 50% stenosed.  Prox RCA lesion, 50% stenosed.  Dist RCA lesion, 30% stenosed.  RPDA lesion, 40% stenosed.  Mid LAD lesion, 75% stenosed. Post intervention, there is a 0% residual stenosis. The lesion was not previously treated.  1. Severe stenosis mid LAD.  2. Successful PTCA/DES x 1 mid LAD 3. Moderate stenosis mid RCA. Patent stents ostial  and mid RCA.  4. Unstable angina  Recommendations: Continue ASA and Plavix for at least one year. Continue statin and beta blocker.   Echocardiogram 08/05/13 Study Conclusions - Left ventricle: Inferobasal hypokinesis The cavity size was normal. Systolic function was normal. The estimated ejection fraction was in the range of 50% to 55%. Wall motion was normal; there were no regional wall motion abnormalities. - Left atrium: The atrium was mildly dilated.   Left heart cath 08/05/13 Procedure Performed:  1. Left Heart Catheterization 2. Selective Coronary Angiography 3. Left ventricular angiogram 4. PTCA/DES x 1 ostial RCA 5. PTCA/DES x 1 mid RCA 6. Angioseal RFA  Patient Profile     61 y.o. male with CAD s/p multiple PCIs, Hypertension, hyperlipidemia, prior large B-cell lymphoma, and.  OSA here with VT.  Assessment & Plan    # VT: Symptoms lasted 2.5 hours with VT rate of 174 bpm.  He returned to sinus rhythm enroute to the hospital.  Currently in sinus rhythm with occasional PVCs, ventricular trigeminy, and ventricular couplets. Continue IV amiodarone and metoprolol. Plan for left heart catheterization on Monday.  Echo pending.   # NSTEMI: Troponin mildly elevated to 0.1.  Continue heparin, clopidogrel, aspirin, metoprolol and atorvastatin.   # Hypertension: Controlled.  Continue metoprolol.  # Hyperlipidemia: Continue atorvastatin.   Signed, Skeet Latch, MD  06/15/2017, 8:52 AM

## 2017-06-15 NOTE — Progress Notes (Addendum)
Patient's HR 45-48, he is on Amio drip. His TSH is 62. Dr. Eula Fried notified, amio stopped at this time.

## 2017-06-15 NOTE — Progress Notes (Signed)
ANTICOAGULATION CONSULT NOTE - Follow Up Consult  Pharmacy Consult for Heparin Indication: chest pain/ACS  Allergies  Allergen Reactions  . Fish Allergy Nausea And Vomiting  . Shellfish Allergy Nausea And Vomiting  . Tape Other (See Comments)    Plastic tape pulls off top layer of skin.  Paper/cloth tape ok    Patient Measurements: Height: 6' (182.9 cm) Weight: 277 lb 9 oz (125.9 kg) IBW/kg (Calculated) : 77.6 Heparin Dosing Weight: 104kg  Vital Signs: Temp: 97.7 F (36.5 C) (06/23 1900) Temp Source: Oral (06/23 1900) BP: 125/93 (06/23 2000) Pulse Rate: 61 (06/23 2000)  Labs:  Recent Labs  06/14/17 1606 06/14/17 2106 06/15/17 0218 06/15/17 0743 06/15/17 1232 06/15/17 1953  HGB 11.5* 13.3 12.4*  --   --   --   HCT 35.0* 40.2 37.9*  --   --   --   PLT 185 205 180  --   --   --   HEPARINUNFRC  --   --  0.69  --  1.08* 0.78*  CREATININE 0.79 0.96 1.07  --   --   --   TROPONINI  --   --   --  0.10* 0.07* 0.06*    Estimated Creatinine Clearance: 99.4 mL/min (by C-G formula based on SCr of 1.07 mg/dL).   Medications:  Heparin @ 1400 units/hr  Assessment: 60yom continues on heparin for NSTEMI with plan for cath Monday 06/17/17. Heparin level is 0.78, decreased but remains supratherapeutic on heparin 1400 units/ml No bleeding noted  Goal of Therapy:  Heparin level 0.3-0.7 units/ml Monitor platelets by anticoagulation protocol: Yes   Plan:  1) Reduce heparin to 1300 units/hr 2) Check heparin level in AM and CBC.   Nicole Cella, RPh Clinical Pharmacist Pager: [phone] 06/15/2017,9:06 PM

## 2017-06-16 LAB — CBC
HEMATOCRIT: 39 % (ref 39.0–52.0)
Hemoglobin: 13.2 g/dL (ref 13.0–17.0)
MCH: 30.8 pg (ref 26.0–34.0)
MCHC: 33.8 g/dL (ref 30.0–36.0)
MCV: 90.9 fL (ref 78.0–100.0)
PLATELETS: 211 10*3/uL (ref 150–400)
RBC: 4.29 MIL/uL (ref 4.22–5.81)
RDW: 14.3 % (ref 11.5–15.5)
WBC: 6.8 10*3/uL (ref 4.0–10.5)

## 2017-06-16 LAB — HEMOGLOBIN A1C
Hgb A1c MFr Bld: 5.7 % — ABNORMAL HIGH (ref 4.8–5.6)
Mean Plasma Glucose: 117 mg/dL

## 2017-06-16 LAB — HEPARIN LEVEL (UNFRACTIONATED)
HEPARIN UNFRACTIONATED: 0.84 [IU]/mL — AB (ref 0.30–0.70)
Heparin Unfractionated: 0.59 IU/mL (ref 0.30–0.70)

## 2017-06-16 MED ORDER — SODIUM CHLORIDE 0.9% FLUSH: 3.0000 mL | INTRAVENOUS | Status: DC | PRN

## 2017-06-16 MED ORDER — SODIUM CHLORIDE 0.9 % WEIGHT BASED INFUSION
1.0000 mL/kg/h | INTRAVENOUS | Status: DC
  Administered 2017-06-16: 1 mL/kg/h via INTRAVENOUS

## 2017-06-16 MED ORDER — SODIUM CHLORIDE 0.9% FLUSH
3.0000 mL | Freq: Two times a day (BID) | INTRAVENOUS | Status: DC
  Administered 2017-06-16 – 2017-06-17 (×2): 3 mL via INTRAVENOUS

## 2017-06-16 MED ORDER — SODIUM CHLORIDE 0.9 % IV SOLN: 250.0000 mL | INTRAVENOUS | Status: DC | PRN

## 2017-06-16 MED ORDER — ASPIRIN 81 MG PO CHEW
81.0000 mg | CHEWABLE_TABLET | ORAL | Status: AC
  Administered 2017-06-17: 81 mg via ORAL

## 2017-06-16 NOTE — Progress Notes (Signed)
ANTICOAGULATION CONSULT NOTE - Follow Up Consult  Pharmacy Consult for Heparin  Indication: chest pain/ACS  Allergies  Allergen Reactions  . Fish Allergy Nausea And Vomiting  . Shellfish Allergy Nausea And Vomiting  . Tape Other (See Comments)    Plastic tape pulls off top layer of skin.  Paper/cloth tape ok    Patient Measurements: Height: 6' (182.9 cm) Weight: 277 lb 9 oz (125.9 kg) IBW/kg (Calculated) : 77.6  Vital Signs: Temp: 97.7 F (36.5 C) (06/24 0300) Temp Source: Oral (06/24 0300) BP: 110/56 (06/24 0500) Pulse Rate: 48 (06/24 0500)  Labs:  Recent Labs  06/14/17 1606 06/14/17 2106  06/15/17 0218 06/15/17 0743 06/15/17 1232 06/15/17 1953 06/16/17 0507  HGB 11.5* 13.3  --  12.4*  --   --   --  13.2  HCT 35.0* 40.2  --  37.9*  --   --   --  39.0  PLT 185 205  --  180  --   --   --  211  HEPARINUNFRC  --   --   < > 0.69  --  1.08* 0.78* 0.84*  CREATININE 0.79 0.96  --  1.07  --   --   --   --   TROPONINI  --   --   --   --  0.10* 0.07* 0.06*  --   < > = values in this interval not displayed.  Estimated Creatinine Clearance: 99.4 mL/min (by C-G formula based on SCr of 1.07 mg/dL).  Assessment: 61 y/o M on heparin for CP, plan for cath Monday, heparin level elevated despite rate decreases  Goal of Therapy:  Heparin level 0.3-0.7 units/ml Monitor platelets by anticoagulation protocol: Yes   Plan:  -Dec heparin to 1100 units/hr -1500 HL  Narda Bonds 06/16/2017,6:20 AM

## 2017-06-16 NOTE — Progress Notes (Signed)
ANTICOAGULATION CONSULT NOTE - Follow Up Consult  Pharmacy Consult for Heparin Indication: chest pain/ACS  Allergies  Allergen Reactions  . Fish Allergy Nausea And Vomiting  . Shellfish Allergy Nausea And Vomiting  . Tape Other (See Comments)    Plastic tape pulls off top layer of skin.  Paper/cloth tape ok    Patient Measurements: Height: 6' (182.9 cm) Weight: 256 lb 2.8 oz (116.2 kg) IBW/kg (Calculated) : 77.6 Heparin Dosing Weight: 104kg  Vital Signs: Temp: 98.6 F (37 C) (06/24 1100) Temp Source: Oral (06/24 1100) BP: 121/89 (06/24 1300) Pulse Rate: 53 (06/24 1300)  Labs:  Recent Labs  06/14/17 1606 06/14/17 2106  06/15/17 0218 06/15/17 0743 06/15/17 1232 06/15/17 1953 06/16/17 0507 06/16/17 1413  HGB 11.5* 13.3  --  12.4*  --   --   --  13.2  --   HCT 35.0* 40.2  --  37.9*  --   --   --  39.0  --   PLT 185 205  --  180  --   --   --  211  --   HEPARINUNFRC  --   --   < > 0.69  --  1.08* 0.78* 0.84* 0.59  CREATININE 0.79 0.96  --  1.07  --   --   --   --   --   TROPONINI  --   --   --   --  0.10* 0.07* 0.06*  --   --   < > = values in this interval not displayed.  Estimated Creatinine Clearance: 95.4 mL/min (by C-G formula based on SCr of 1.07 mg/dL).   Medications:  Heparin @ 1100 units/hr  Assessment: 60yom continues on heparin for NSTEMI with plan for cath tomorrow. Heparin level is therapeutic at 0.59. CBC stable. No bleeding.  Goal of Therapy:  Heparin level 0.3-0.7 units/ml Monitor platelets by anticoagulation protocol: Yes   Plan:  1) Continue heparin at 1100 units/hr 2) Daily heparin level and CBC  Deboraha Sprang 06/16/2017,3:19 PM

## 2017-06-16 NOTE — Progress Notes (Signed)
Progress Note  Patient Name: Michael Singleton Date of Encounter: 06/16/2017  Primary Cardiologist: Julianne Handler  Subjective   No chest pain, sob, or palpitations  Inpatient Medications    Scheduled Meds: . aspirin EC  81 mg Oral Daily  . atorvastatin  80 mg Oral Daily  . buPROPion  300 mg Oral Daily  . citalopram  40 mg Oral Daily  . clopidogrel  75 mg Oral Q breakfast  . gabapentin  300 mg Oral TID  . levothyroxine  150 mcg Oral QAC breakfast  . metoprolol tartrate  25 mg Oral BID  . pantoprazole  40 mg Oral Daily  . traZODone  200 mg Oral QHS   Continuous Infusions: . amiodarone 30 mg/hr (06/15/17 0015)  . heparin 1,100 Units/hr (06/16/17 0600)   PRN Meds: acetaminophen, ALPRAZolam, nitroGLYCERIN, ondansetron (ZOFRAN) IV   Vital Signs    Vitals:   06/16/17 0600 06/16/17 0700 06/16/17 0800 06/16/17 0900  BP: 124/63 112/68 (!) 141/65 (!) 161/84  Pulse: (!) 49 (!) 47 67 65  Resp: 10   18  Temp:  98.1 F (36.7 C)    TempSrc:      SpO2: 100% 99% 99% 95%  Weight: 256 lb 2.8 oz (116.2 kg)     Height:        Intake/Output Summary (Last 24 hours) at 06/16/17 1008 Last data filed at 06/16/17 0600  Gross per 24 hour  Intake           1158.5 ml  Output             3350 ml  Net          -2191.5 ml   Filed Weights   06/14/17 1551 06/15/17 0000 06/16/17 0600  Weight: 271 lb (122.9 kg) 277 lb 9 oz (125.9 kg) 256 lb 2.8 oz (116.2 kg)    Telemetry    nsr - Personally Reviewed  ECG    none - Personally Reviewed  Physical Exam   GEN: No acute distress.   Neck: 6 cm JVD Cardiac: RRR, no murmurs, rubs, or gallops.  Respiratory: Clear to auscultation bilaterally. GI: Soft, nontender, non-distended  MS: No edema; No deformity. Neuro:  Nonfocal  Psych: Normal affect   Labs    Chemistry Recent Labs Lab 06/14/17 1606 06/14/17 2106 06/15/17 0218  NA 141 138 140  K 3.3* 4.3 4.2  CL 112* 104 106  CO2 23 25 26   GLUCOSE 86 114* 97  BUN 6 7 8   CREATININE 0.79  0.96 1.07  CALCIUM 7.9* 9.7 9.3  PROT  --  6.8  --   ALBUMIN  --  4.1  --   AST  --  31  --   ALT  --  25  --   ALKPHOS  --  115  --   BILITOT  --  0.5  --   GFRNONAA >60 >60 >60  GFRAA >60 >60 >60  ANIONGAP 6 9 8      Hematology Recent Labs Lab 06/14/17 2106 06/15/17 0218 06/16/17 0507  WBC 6.6 6.7 6.8  RBC 4.43 4.18* 4.29  HGB 13.3 12.4* 13.2  HCT 40.2 37.9* 39.0  MCV 90.7 90.7 90.9  MCH 30.0 29.7 30.8  MCHC 33.1 32.7 33.8  RDW 14.1 14.2 14.3  PLT 205 180 211    Cardiac Enzymes Recent Labs Lab 06/15/17 0743 06/15/17 1232 06/15/17 1953  TROPONINI 0.10* 0.07* 0.06*    Recent Labs Lab 06/14/17 1613  TROPIPOC 0.02  BNP Recent Labs Lab 06/14/17 2106  BNP 186.8*     DDimer No results for input(s): DDIMER in the last 168 hours.   Radiology    Dg Chest 2 View  Result Date: 06/14/2017 CLINICAL DATA:  Left-sided chest pain EXAM: CHEST  2 VIEW COMPARISON:  Chest radiograph 08/04/2013 FINDINGS: Mild cardiomegaly. Lungs are clear. No pleural effusion or pneumothorax. IMPRESSION: No active cardiopulmonary disease.  Unchanged mild cardiomegaly. Electronically Signed   By: Ulyses Jarred M.D.   On: 06/14/2017 17:43    Cardiac Studies   2D echo - EF 50%.   Patient Profile     61 y.o. male admitted with sustained wide qrs tachycardia, ruled out.   Assessment & Plan    1. Probable VT - he is pending left heart cath. Likely ICD but may also need cardiac MRI. He is off of IV amiodarone. Will hold off on oral amio.  2. CAD - await left heart cath.  3. HTN - his blood pressure has been fairly well controlled. Will follow.  Signed, Michael Peru, MD  06/16/2017, 10:08 AM  Patient ID: Michael Singleton, male   DOB: 1956/04/29, 61 y.o.   MRN: [id number]

## 2017-06-17 ENCOUNTER — Encounter (HOSPITAL_COMMUNITY)
Admission: EM | Disposition: A | Discharge status: Home / Self Care | Payer: Self-pay | Source: Home / Self Care | Attending: Cardiology

## 2017-06-17 DIAGNOSIS — R000 Tachycardia, unspecified: Secondary | ICD-10-CM

## 2017-06-17 DIAGNOSIS — I472 Ventricular tachycardia: Secondary | ICD-10-CM

## 2017-06-17 HISTORY — PX: CORONARY ANGIOGRAPHY: CATH118303

## 2017-06-17 LAB — PROTIME-INR
INR: 0.99
PROTHROMBIN TIME: 13 s (ref 11.4–15.2)

## 2017-06-17 LAB — HEPARIN LEVEL (UNFRACTIONATED): Heparin Unfractionated: 0.37 IU/mL (ref 0.30–0.70)

## 2017-06-17 LAB — CBC
HCT: 38 % — ABNORMAL LOW (ref 39.0–52.0)
HEMOGLOBIN: 12.3 g/dL — AB (ref 13.0–17.0)
MCH: 29.4 pg (ref 26.0–34.0)
MCHC: 32.4 g/dL (ref 30.0–36.0)
MCV: 90.9 fL (ref 78.0–100.0)
PLATELETS: 171 10*3/uL (ref 150–400)
RBC: 4.18 MIL/uL — AB (ref 4.22–5.81)
RDW: 14.3 % (ref 11.5–15.5)
WBC: 5.9 10*3/uL (ref 4.0–10.5)

## 2017-06-17 SURGERY — CORONARY ANGIOGRAPHY (CATH LAB)
Anesthesia: LOCAL

## 2017-06-17 MED ORDER — ACETAMINOPHEN 325 MG PO TABS
650.0000 mg | ORAL_TABLET | ORAL | Status: DC | PRN
Start: 2017-06-17 — End: 2017-06-19
  Administered 2017-06-18: 650 mg via ORAL
  Filled 2017-06-17 (×2): qty 2

## 2017-06-17 MED ORDER — ONDANSETRON HCL 4 MG/2ML IJ SOLN: 4.0000 mg | Freq: Four times a day (QID) | INTRAMUSCULAR | Status: DC | PRN

## 2017-06-17 MED ORDER — MORPHINE SULFATE (PF) 4 MG/ML IV SOLN
2.0000 mg | INTRAVENOUS | Status: DC | PRN
  Administered 2017-06-19: 2 mg via INTRAVENOUS
  Filled 2017-06-17: qty 1

## 2017-06-17 MED ORDER — FENTANYL CITRATE (PF) 100 MCG/2ML IJ SOLN
INTRAMUSCULAR | Status: AC
  Filled 2017-06-17: qty 2

## 2017-06-17 MED ORDER — SODIUM CHLORIDE 0.9 % IV SOLN: INTRAVENOUS | Status: AC

## 2017-06-17 MED ORDER — CHLORHEXIDINE GLUCONATE 4 % EX LIQD
60.0000 mL | Freq: Once | CUTANEOUS | Status: AC
  Administered 2017-06-17: 4 via TOPICAL
  Filled 2017-06-17: qty 60

## 2017-06-17 MED ORDER — VANCOMYCIN HCL 10 G IV SOLR: 1500.0000 mg | INTRAVENOUS | Status: DC

## 2017-06-17 MED ORDER — IOPAMIDOL (ISOVUE-370) INJECTION 76%
INTRAVENOUS | Status: DC | PRN
  Administered 2017-06-17: 57 mL via INTRA_ARTERIAL

## 2017-06-17 MED ORDER — LIDOCAINE HCL (PF) 1 % IJ SOLN
INTRAMUSCULAR | Status: AC
  Filled 2017-06-17: qty 30

## 2017-06-17 MED ORDER — SODIUM CHLORIDE 0.9 % IR SOLN: 80.0000 mg | Status: DC

## 2017-06-17 MED ORDER — IOPAMIDOL (ISOVUE-370) INJECTION 76%
INTRAVENOUS | Status: AC
  Filled 2017-06-17: qty 100

## 2017-06-17 MED ORDER — FENTANYL CITRATE (PF) 100 MCG/2ML IJ SOLN
INTRAMUSCULAR | Status: DC | PRN
  Administered 2017-06-17: 25 ug via INTRAVENOUS

## 2017-06-17 MED ORDER — DEXTROSE 5 % IV SOLN
3.0000 g | INTRAVENOUS | Status: AC
  Administered 2017-06-18: 3 g via INTRAVENOUS
  Filled 2017-06-17: qty 3000
  Filled 2017-06-17: qty 3

## 2017-06-17 MED ORDER — SODIUM CHLORIDE 0.9% FLUSH: 3.0000 mL | INTRAVENOUS | Status: DC | PRN

## 2017-06-17 MED ORDER — MIDAZOLAM HCL 2 MG/2ML IJ SOLN
INTRAMUSCULAR | Status: DC | PRN
  Administered 2017-06-17: 1 mg via INTRAVENOUS

## 2017-06-17 MED ORDER — CLOPIDOGREL BISULFATE 75 MG PO TABS
75.0000 mg | ORAL_TABLET | Freq: Every day | ORAL | Status: DC
Start: 2017-06-18 — End: 2017-06-18
  Filled 2017-06-17: qty 1

## 2017-06-17 MED ORDER — SODIUM CHLORIDE 0.9 % IV SOLN
INTRAVENOUS | Status: DC
  Administered 2017-06-18: 03:00:00 via INTRAVENOUS

## 2017-06-17 MED ORDER — ASPIRIN 81 MG PO CHEW
81.0000 mg | CHEWABLE_TABLET | Freq: Every day | ORAL | Status: DC
  Administered 2017-06-18 – 2017-06-19 (×2): 81 mg via ORAL
  Filled 2017-06-17 (×2): qty 1

## 2017-06-17 MED ORDER — LIDOCAINE HCL (PF) 1 % IJ SOLN
INTRAMUSCULAR | Status: DC | PRN
  Administered 2017-06-17: 26 mL via INTRADERMAL

## 2017-06-17 MED ORDER — HEPARIN (PORCINE) IN NACL 2-0.9 UNIT/ML-% IJ SOLN
INTRAMUSCULAR | Status: AC
  Filled 2017-06-17: qty 1000

## 2017-06-17 MED ORDER — HEPARIN (PORCINE) IN NACL 2-0.9 UNIT/ML-% IJ SOLN
INTRAMUSCULAR | Status: AC | PRN
  Administered 2017-06-17: 1000 mL

## 2017-06-17 MED ORDER — SODIUM CHLORIDE 0.9 % IV SOLN: 250.0000 mL | INTRAVENOUS | Status: DC | PRN

## 2017-06-17 MED ORDER — MIDAZOLAM HCL 2 MG/2ML IJ SOLN
INTRAMUSCULAR | Status: AC
  Filled 2017-06-17: qty 2

## 2017-06-17 MED ORDER — SODIUM CHLORIDE 0.9% FLUSH
3.0000 mL | Freq: Two times a day (BID) | INTRAVENOUS | Status: DC
  Administered 2017-06-18 (×2): 3 mL via INTRAVENOUS

## 2017-06-17 MED ORDER — CHLORHEXIDINE GLUCONATE 4 % EX LIQD
60.0000 mL | Freq: Once | CUTANEOUS | Status: AC
  Administered 2017-06-18: 4 via TOPICAL
  Filled 2017-06-17: qty 15

## 2017-06-17 MED ORDER — SODIUM CHLORIDE 0.9 % IV SOLN: INTRAVENOUS | Status: DC

## 2017-06-17 SURGICAL SUPPLY — 10 items
CATH INFINITI 5FR MULTPACK ANG (CATHETERS) ×1 IMPLANT
DEVICE CLOSURE MYNXGRIP 5F (Vascular Products) ×1 IMPLANT
HOVERMATT SINGLE USE (MISCELLANEOUS) ×1 IMPLANT
KIT HEART LEFT (KITS) ×2 IMPLANT
PACK CARDIAC CATHETERIZATION (CUSTOM PROCEDURE TRAY) ×2 IMPLANT
SHEATH PINNACLE 5F 10CM (SHEATH) ×1 IMPLANT
SYR MEDRAD MARK V 150ML (SYRINGE) ×2 IMPLANT
TRANSDUCER W/STOPCOCK (MISCELLANEOUS) ×2 IMPLANT
TUBING CIL FLEX 10 FLL-RA (TUBING) ×2 IMPLANT
WIRE EMERALD 3MM-J .035X150CM (WIRE) ×1 IMPLANT

## 2017-06-17 NOTE — Progress Notes (Signed)
ANTICOAGULATION CONSULT NOTE - Follow Up Consult  Pharmacy Consult for Heparin Indication: chest pain/ACS  Allergies  Allergen Reactions  . Fish Allergy Nausea And Vomiting  . Shellfish Allergy Nausea And Vomiting  . Tape Other (See Comments)    Plastic tape pulls off top layer of skin.  Paper/cloth tape ok    Patient Measurements: Height: 6' (182.9 cm) Weight: 271 lb 2.7 oz (123 kg) IBW/kg (Calculated) : 77.6 Heparin Dosing Weight: 104kg  Vital Signs: Temp: 98.1 F (36.7 C) (06/24 2300) Temp Source: Oral (06/24 2300) BP: 99/49 (06/25 0600) Pulse Rate: 47 (06/25 0600)  Labs:  Recent Labs  06/14/17 1606 06/14/17 2106  06/15/17 0218 06/15/17 0743 06/15/17 1232 06/15/17 1953 06/16/17 0507 06/16/17 1413 06/17/17 0259  HGB 11.5* 13.3  --  12.4*  --   --   --  13.2  --  12.3*  HCT 35.0* 40.2  --  37.9*  --   --   --  39.0  --  38.0*  PLT 185 205  --  180  --   --   --  211  --  171  LABPROT  --   --   --   --   --   --   --   --   --  13.0  INR  --   --   --   --   --   --   --   --   --  0.99  HEPARINUNFRC  --   --   < > 0.69  --  1.08* 0.78* 0.84* 0.59 0.37  CREATININE 0.79 0.96  --  1.07  --   --   --   --   --   --   TROPONINI  --   --   --   --  0.10* 0.07* 0.06*  --   --   --   < > = values in this interval not displayed.  Estimated Creatinine Clearance: 98.2 mL/min (by C-G formula based on SCr of 1.07 mg/dL).   Medications:  Heparin @ 1100 units/hr  Assessment: 60yom continues on heparin for NSTEMI with plan for cath tomorrow. Heparin level remains therapeutic at 0.37. CBC stable. No bleeding or complications noted.  Goal of Therapy:  Heparin level 0.3-0.7 units/ml Monitor platelets by anticoagulation protocol: Yes   Plan:  1) Continue heparin at 1100 units/hr 2) Daily heparin level and CBC 3) F/u plans for heparin after cath today.  Uvaldo Rising, BCPS  Clinical Pharmacist Pager [phone]  06/17/2017 8:33 AM

## 2017-06-17 NOTE — Interval H&P Note (Signed)
Cath Lab Visit (complete for each Cath Lab visit)  Clinical Evaluation Leading to the Procedure:   ACS: Yes.    Non-ACS:    Anginal Classification: CCS III  Anti-ischemic medical therapy: Minimal Therapy (1 class of medications)  Non-Invasive Test Results: No non-invasive testing performed  Prior CABG: No previous CABG      History and Physical Interval Note:  06/17/2017 1:35 PM  Michael Singleton  has presented today for surgery, with the diagnosis of NSTEMI  The various methods of treatment have been discussed with the patient and family. After consideration of risks, benefits and other options for treatment, the patient has consented to  Procedure(s): Left Heart Cath and Coronary Angiography (N/A) as a surgical intervention .  The patient's history has been reviewed, patient examined, no change in status, stable for surgery.  I have reviewed the patient's chart and labs.  Questions were answered to the patient's satisfaction.     Quay Burow

## 2017-06-17 NOTE — H&P (View-Only) (Signed)
Progress Note  Patient Name: Michael Singleton Date of Encounter: 06/17/2017  Primary Cardiologist: Julianne Handler  Subjective   No chest pain or sob. No other complaints. Awaiting heart cath.   Inpatient Medications    Scheduled Meds: . aspirin EC  81 mg Oral Daily  . atorvastatin  80 mg Oral Daily  . buPROPion  300 mg Oral Daily  . citalopram  40 mg Oral Daily  . clopidogrel  75 mg Oral Q breakfast  . gabapentin  300 mg Oral TID  . levothyroxine  150 mcg Oral QAC breakfast  . metoprolol tartrate  25 mg Oral BID  . pantoprazole  40 mg Oral Daily  . sodium chloride flush  3 mL Intravenous Q12H  . traZODone  200 mg Oral QHS   Continuous Infusions: . sodium chloride    . sodium chloride 1 mL/kg/hr (06/17/17 2035)  . heparin 1,100 Units/hr (06/17/17 5974)   PRN Meds: sodium chloride, acetaminophen, ALPRAZolam, nitroGLYCERIN, ondansetron (ZOFRAN) IV, sodium chloride flush   Vital Signs    Vitals:   06/17/17 0300 06/17/17 0400 06/17/17 0500 06/17/17 0600  BP: 113/69 99/78 (!) 97/44 (!) 99/49  Pulse: (!) 51 (!) 44 (!) 49 (!) 47  Resp: (!) 4 14 12  (!) 7  Temp:      TempSrc:      SpO2: 100% 99% 100% 100%  Weight:   271 lb 2.7 oz (123 kg)   Height:        Intake/Output Summary (Last 24 hours) at 06/17/17 0900 Last data filed at 06/17/17 1638  Gross per 24 hour  Intake          2290.26 ml  Output             2100 ml  Net           190.26 ml   Filed Weights   06/15/17 0000 06/16/17 0600 06/17/17 0500  Weight: 277 lb 9 oz (125.9 kg) 256 lb 2.8 oz (116.2 kg) 271 lb 2.7 oz (123 kg)    Telemetry    Nsr/sinus bradycardia - Personally Reviewed  ECG    nsr - Personally Reviewed  Physical Exam   GEN: No acute distress.   Neck: 6 cm JVD Cardiac: RRR, no murmurs, rubs, or gallops.  Respiratory: Clear to auscultation bilaterally. GI: Soft, nontender, non-distended  MS: No edema; No deformity. Neuro:  Nonfocal  Psych: Normal affect   Labs    Chemistry Recent  Labs Lab 06/14/17 1606 06/14/17 2106 06/15/17 0218  NA 141 138 140  K 3.3* 4.3 4.2  CL 112* 104 106  CO2 23 25 26   GLUCOSE 86 114* 97  BUN 6 7 8   CREATININE 0.79 0.96 1.07  CALCIUM 7.9* 9.7 9.3  PROT  --  6.8  --   ALBUMIN  --  4.1  --   AST  --  31  --   ALT  --  25  --   ALKPHOS  --  115  --   BILITOT  --  0.5  --   GFRNONAA >60 >60 >60  GFRAA >60 >60 >60  ANIONGAP 6 9 8      Hematology Recent Labs Lab 06/15/17 0218 06/16/17 0507 06/17/17 0259  WBC 6.7 6.8 5.9  RBC 4.18* 4.29 4.18*  HGB 12.4* 13.2 12.3*  HCT 37.9* 39.0 38.0*  MCV 90.7 90.9 90.9  MCH 29.7 30.8 29.4  MCHC 32.7 33.8 32.4  RDW 14.2 14.3 14.3  PLT 180 211 171  Cardiac Enzymes Recent Labs Lab 06/15/17 0743 06/15/17 1232 06/15/17 1953  TROPONINI 0.10* 0.07* 0.06*    Recent Labs Lab 06/14/17 1613  TROPIPOC 0.02     BNP Recent Labs Lab 06/14/17 2106  BNP 186.8*     DDimer No results for input(s): DDIMER in the last 168 hours.   Radiology    No results found.  Cardiac Studies   None, cath pending. ECG on admit reviewed by me shows a Right Bundle VT with a northwest axis, and r/s less than one in V6 confirming a diagnosis of VT  Patient Profile     61 y.o. 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 His wife stated that his blood pressure cuff would not register a blood pressure it was so low during VT. He has a inferior and septal scar. I have discussed the treatment options. If no obvious revascularization, then we will proceed with ICD insertion.  2. CAD - he is s/p multiple stents, s/p remote MI. Left heart cath later today.  3. HTN - his blood pressure is well controlled. Continue current meds.  4. 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   Signed, Cristopher Peru, MD  06/17/2017, 9:00 AM

## 2017-06-17 NOTE — Plan of Care (Signed)
Problem: Pain Managment: Goal: General experience of comfort will improve Outcome: Progressing Pt is experiencing no pain.  Problem: Activity: Goal: Ability to return to baseline activity level will improve Outcome: Progressing Pt is able to ambulate to bathroom and get up without difficulty

## 2017-06-17 NOTE — Consult Note (Signed)
Progress Note  Patient Name: Michael Singleton Date of Encounter: 06/17/2017  Primary Cardiologist: Julianne Handler  Subjective   No chest pain or sob. No other complaints. Awaiting heart cath.   Inpatient Medications    Scheduled Meds: . aspirin EC  81 mg Oral Daily  . atorvastatin  80 mg Oral Daily  . buPROPion  300 mg Oral Daily  . citalopram  40 mg Oral Daily  . clopidogrel  75 mg Oral Q breakfast  . gabapentin  300 mg Oral TID  . levothyroxine  150 mcg Oral QAC breakfast  . metoprolol tartrate  25 mg Oral BID  . pantoprazole  40 mg Oral Daily  . sodium chloride flush  3 mL Intravenous Q12H  . traZODone  200 mg Oral QHS   Continuous Infusions: . sodium chloride    . sodium chloride 1 mL/kg/hr (06/17/17 3614)  . heparin 1,100 Units/hr (06/17/17 4315)   PRN Meds: sodium chloride, acetaminophen, ALPRAZolam, nitroGLYCERIN, ondansetron (ZOFRAN) IV, sodium chloride flush   Vital Signs    Vitals:   06/17/17 0300 06/17/17 0400 06/17/17 0500 06/17/17 0600  BP: 113/69 99/78 (!) 97/44 (!) 99/49  Pulse: (!) 51 (!) 44 (!) 49 (!) 47  Resp: (!) 4 14 12  (!) 7  Temp:      TempSrc:      SpO2: 100% 99% 100% 100%  Weight:   271 lb 2.7 oz (123 kg)   Height:        Intake/Output Summary (Last 24 hours) at 06/17/17 0900 Last data filed at 06/17/17 4008  Gross per 24 hour  Intake          2290.26 ml  Output             2100 ml  Net           190.26 ml   Filed Weights   06/15/17 0000 06/16/17 0600 06/17/17 0500  Weight: 277 lb 9 oz (125.9 kg) 256 lb 2.8 oz (116.2 kg) 271 lb 2.7 oz (123 kg)    Telemetry    Nsr/sinus bradycardia - Personally Reviewed  ECG    nsr - Personally Reviewed  Physical Exam   GEN: No acute distress.   Neck: 6 cm JVD Cardiac: RRR, no murmurs, rubs, or gallops.  Respiratory: Clear to auscultation bilaterally. GI: Soft, nontender, non-distended  MS: No edema; No deformity. Neuro:  Nonfocal  Psych: Normal affect   Labs    Chemistry Recent  Labs Lab 06/14/17 1606 06/14/17 2106 06/15/17 0218  NA 141 138 140  K 3.3* 4.3 4.2  CL 112* 104 106  CO2 23 25 26   GLUCOSE 86 114* 97  BUN 6 7 8   CREATININE 0.79 0.96 1.07  CALCIUM 7.9* 9.7 9.3  PROT  --  6.8  --   ALBUMIN  --  4.1  --   AST  --  31  --   ALT  --  25  --   ALKPHOS  --  115  --   BILITOT  --  0.5  --   GFRNONAA >60 >60 >60  GFRAA >60 >60 >60  ANIONGAP 6 9 8      Hematology Recent Labs Lab 06/15/17 0218 06/16/17 0507 06/17/17 0259  WBC 6.7 6.8 5.9  RBC 4.18* 4.29 4.18*  HGB 12.4* 13.2 12.3*  HCT 37.9* 39.0 38.0*  MCV 90.7 90.9 90.9  MCH 29.7 30.8 29.4  MCHC 32.7 33.8 32.4  RDW 14.2 14.3 14.3  PLT 180 211 171  Cardiac Enzymes Recent Labs Lab 06/15/17 0743 06/15/17 1232 06/15/17 1953  TROPONINI 0.10* 0.07* 0.06*    Recent Labs Lab 06/14/17 1613  TROPIPOC 0.02     BNP Recent Labs Lab 06/14/17 2106  BNP 186.8*     DDimer No results for input(s): DDIMER in the last 168 hours.   Radiology    No results found.  Cardiac Studies   None, cath pending. ECG on admit reviewed by me shows a Right Bundle VT with a northwest axis, and r/s less than one in V6 confirming a diagnosis of VT  Patient Profile     61 y.o. 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 His wife stated that his blood pressure cuff would not register a blood pressure it was so low during VT. He has a inferior and septal scar. I have discussed the treatment options. If no obvious revascularization, then we will proceed with ICD insertion.  2. CAD - he is s/p multiple stents, s/p remote MI. Left heart cath later today.  3. HTN - his blood pressure is well controlled. Continue current meds.  4. 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   Signed, Cristopher Peru, MD  06/17/2017, 9:00 AM

## 2017-06-18 ENCOUNTER — Inpatient Hospital Stay (HOSPITAL_COMMUNITY)
Admission: EM | Disposition: A | Discharge status: Home / Self Care | Payer: Self-pay | Source: Home / Self Care | Attending: Cardiology

## 2017-06-18 ENCOUNTER — Encounter (HOSPITAL_COMMUNITY): Payer: Self-pay | Admitting: Cardiovascular Disease

## 2017-06-18 HISTORY — PX: ICD IMPLANT: EP1208

## 2017-06-18 LAB — CBC
HCT: 38.4 % — ABNORMAL LOW (ref 39.0–52.0)
HEMOGLOBIN: 12 g/dL — AB (ref 13.0–17.0)
MCH: 29.1 pg (ref 26.0–34.0)
MCHC: 31.3 g/dL (ref 30.0–36.0)
MCV: 93 fL (ref 78.0–100.0)
Platelets: 178 10*3/uL (ref 150–400)
RBC: 4.13 MIL/uL — AB (ref 4.22–5.81)
RDW: 14.4 % (ref 11.5–15.5)
WBC: 6.8 10*3/uL (ref 4.0–10.5)

## 2017-06-18 LAB — BASIC METABOLIC PANEL
Anion gap: 6 (ref 5–15)
BUN: 9 mg/dL (ref 6–20)
CHLORIDE: 105 mmol/L (ref 101–111)
CO2: 29 mmol/L (ref 22–32)
Calcium: 8.9 mg/dL (ref 8.9–10.3)
Creatinine, Ser: 1.15 mg/dL (ref 0.61–1.24)
GFR calc non Af Amer: >60 mL/min (ref >60)
Glucose, Bld: 85 mg/dL (ref 65–99)
POTASSIUM: 4.6 mmol/L (ref 3.5–5.1)
SODIUM: 140 mmol/L (ref 135–145)

## 2017-06-18 LAB — SURGICAL PCR SCREEN
MRSA, PCR: NEGATIVE
STAPHYLOCOCCUS AUREUS: POSITIVE — AB

## 2017-06-18 SURGERY — ICD IMPLANT

## 2017-06-18 MED ORDER — SODIUM CHLORIDE 0.9 % IR SOLN
Status: AC
  Filled 2017-06-18: qty 2

## 2017-06-18 MED ORDER — CHLORHEXIDINE GLUCONATE CLOTH 2 % EX PADS: 6.0000 | MEDICATED_PAD | Freq: Every day | CUTANEOUS | Status: DC

## 2017-06-18 MED ORDER — CEFAZOLIN SODIUM-DEXTROSE 2-4 GM/100ML-% IV SOLN
2.0000 g | Freq: Four times a day (QID) | INTRAVENOUS | Status: AC
  Administered 2017-06-18 – 2017-06-19 (×3): 2 g via INTRAVENOUS
  Filled 2017-06-18 (×4): qty 100

## 2017-06-18 MED ORDER — HEPARIN (PORCINE) IN NACL 2-0.9 UNIT/ML-% IJ SOLN
INTRAMUSCULAR | Status: AC | PRN
  Administered 2017-06-18: 500 mL via INTRA_ARTERIAL

## 2017-06-18 MED ORDER — MIDAZOLAM HCL 5 MG/5ML IJ SOLN
INTRAMUSCULAR | Status: AC
  Filled 2017-06-18: qty 5

## 2017-06-18 MED ORDER — ONDANSETRON HCL 4 MG/2ML IJ SOLN: 4.0000 mg | Freq: Four times a day (QID) | INTRAMUSCULAR | Status: DC | PRN

## 2017-06-18 MED ORDER — ORAL CARE MOUTH RINSE
15.0000 mL | Freq: Two times a day (BID) | OROMUCOSAL | Status: DC
  Administered 2017-06-18: 15 mL via OROMUCOSAL

## 2017-06-18 MED ORDER — IOPAMIDOL (ISOVUE-370) INJECTION 76%
INTRAVENOUS | Status: DC | PRN
  Administered 2017-06-18: 15 mL via INTRAVENOUS

## 2017-06-18 MED ORDER — MUPIROCIN 2 % EX OINT
1.0000 "application " | TOPICAL_OINTMENT | Freq: Two times a day (BID) | CUTANEOUS | Status: DC
  Administered 2017-06-18 (×2): 1 via NASAL
  Filled 2017-06-18 (×3): qty 22

## 2017-06-18 MED ORDER — FENTANYL CITRATE (PF) 100 MCG/2ML IJ SOLN
INTRAMUSCULAR | Status: DC | PRN
Start: 2017-06-18 — End: 2017-06-18
  Administered 2017-06-18 (×3): 25 ug via INTRAVENOUS

## 2017-06-18 MED ORDER — SODIUM CHLORIDE 0.9 % IR SOLN
Status: DC | PRN
  Administered 2017-06-18: 500 mL

## 2017-06-18 MED ORDER — FENTANYL CITRATE (PF) 100 MCG/2ML IJ SOLN
INTRAMUSCULAR | Status: AC
  Filled 2017-06-18: qty 2

## 2017-06-18 MED ORDER — ACETAMINOPHEN 325 MG PO TABS: 325.0000 mg | ORAL_TABLET | ORAL | Status: DC | PRN

## 2017-06-18 MED ORDER — MIDAZOLAM HCL 5 MG/5ML IJ SOLN
INTRAMUSCULAR | Status: DC | PRN
  Administered 2017-06-18 (×3): 2 mg via INTRAVENOUS

## 2017-06-18 SURGICAL SUPPLY — 8 items
CABLE SURGICAL S-101-97-12 (CABLE) ×2 IMPLANT
ICD ELLIPSE DR CD2411-36C (ICD Generator) ×2 IMPLANT
LEAD DURATA 7122-65CM (Lead) ×2 IMPLANT
LEAD TENDRIL MRI 52CM LPA1200M (Lead) ×2 IMPLANT
PAD DEFIB LIFELINK (PAD) ×2 IMPLANT
SHEATH CLASSIC 7F (SHEATH) ×2 IMPLANT
SHEATH CLASSIC 8F (SHEATH) ×2 IMPLANT
TRAY PACEMAKER INSERTION (PACKS) ×2 IMPLANT

## 2017-06-18 NOTE — Progress Notes (Signed)
Progress Note  Patient Name: Michael Singleton Date of Encounter: 06/18/2017  Primary Cardiologist: Dr. Angelena Singleton  Subjective   Feels well this morning, denies any CP, palpitations or SOB  Inpatient Medications    Scheduled Meds: . aspirin  81 mg Oral Daily  . atorvastatin  80 mg Oral Daily  . buPROPion  300 mg Oral Daily  . citalopram  40 mg Oral Daily  . gabapentin  300 mg Oral TID  . gentamicin irrigation  80 mg Irrigation On Call  . levothyroxine  150 mcg Oral QAC breakfast  . mouth rinse  15 mL Mouth Rinse BID  . metoprolol tartrate  25 mg Oral BID  . pantoprazole  40 mg Oral Daily  . sodium chloride flush  3 mL Intravenous Q12H  . traZODone  200 mg Oral QHS   Continuous Infusions: . sodium chloride 50 mL/hr at 06/18/17 0800  . sodium chloride    . sodium chloride    .  ceFAZolin (ANCEF) IV     PRN Meds: sodium chloride, acetaminophen, ALPRAZolam, morphine injection, nitroGLYCERIN, ondansetron (ZOFRAN) IV, sodium chloride flush   Vital Signs    Vitals:   06/18/17 0500 06/18/17 0600 06/18/17 0700 06/18/17 0800  BP: (!) 152/140 (!) 129/59 (!) 93/43 132/64  Pulse: (!) 50 (!) 46 (!) 48 (!) 50  Resp: (!) 9 11 10 11   Temp:    97.8 F (36.6 C)  TempSrc:      SpO2: 98% 100% 99% 100%  Weight:      Height:        Intake/Output Summary (Last 24 hours) at 06/18/17 1046 Last data filed at 06/18/17 0930  Gross per 24 hour  Intake           1101.6 ml  Output             2875 ml  Net          -1773.4 ml   Filed Weights   06/16/17 0600 06/17/17 0500 06/18/17 0400  Weight: 256 lb 2.8 oz (116.2 kg) 271 lb 2.7 oz (123 kg) 271 lb 9.7 oz (123.2 kg)    Telemetry    SB/SR 40's-70's - Personally Reviewed  ECG    No new EKGs  Physical Exam   GEN: No acute distress.   Neck: No JVD Cardiac: RRR, 1/6 SM, rubs, or gallops.  Respiratory: Clear to auscultation bilaterally. GI: Soft, nontender, non-distended  MS: No edema; No deformity. Neuro:  Nonfocal  Psych:  Normal affect   Labs    Chemistry Recent Labs Lab 06/14/17 2106 06/15/17 0218 06/18/17 0403  NA 138 140 140  K 4.3 4.2 4.6  CL 104 106 105  CO2 25 26 29   GLUCOSE 114* 97 85  BUN 7 8 9   CREATININE 0.96 1.07 1.15  CALCIUM 9.7 9.3 8.9  PROT 6.8  --   --   ALBUMIN 4.1  --   --   AST 31  --   --   ALT 25  --   --   ALKPHOS 115  --   --   BILITOT 0.5  --   --   GFRNONAA >60 >60 >60  GFRAA >60 >60 >60  ANIONGAP 9 8 6      Hematology Recent Labs Lab 06/16/17 0507 06/17/17 0259 06/18/17 0403  WBC 6.8 5.9 6.8  RBC 4.29 4.18* 4.13*  HGB 13.2 12.3* 12.0*  HCT 39.0 38.0* 38.4*  MCV 90.9 90.9 93.0  MCH 30.8 29.4 29.1  MCHC 33.8 32.4 31.3  RDW 14.3 14.3 14.4  PLT 211 171 178    Cardiac Enzymes Recent Labs Lab 06/15/17 0743 06/15/17 1232 06/15/17 1953  TROPONINI 0.10* 0.07* 0.06*    Recent Labs Lab 06/14/17 1613  TROPIPOC 0.02     BNP Recent Labs Lab 06/14/17 2106  BNP 186.8*     DDimer No results for input(s): DDIMER in the last 168 hours.   Radiology    No results found.  Cardiac Studies   LHC yesterday, 06/17/17  Mid LAD lesion, 0 %stenosed.  Prox LAD lesion, 30 %stenosed.  Ost RCA to Prox RCA lesion, 0 %stenosed.  Mid RCA lesion, 0 %stenosed.  Prox RCA lesion, 40 %stenosed.   06/15/17: TTE Study Conclusions - Left ventricle: Distal septal and inferior basal hypokinesis The   cavity size was normal. Wall thickness was normal. Systolic   function was normal. The estimated ejection fraction was in the   range of 50% to 55%. Left ventricular diastolic function   parameters were normal. - Aortic valve: There was mild stenosis. There was mild   regurgitation. Valve area (VTI): 1.35 cm^2. Valve area (Vmax):   1.25 cm^2. Valve area (Vmean): 1.2 cm^2. - Mitral valve: There was mild regurgitation. - Left atrium: The atrium was mildly dilated. - Atrial septum: No defect or patent foramen ovale was identified.  Patient Profile     61 y.o.  male admitted with sustained monomorphic VT   Assessment & Plan    1. Probable VT      No further off IV amiodarone, continue to hold off PO     Dr. Lovena Singleton noted: ECG on admit showed a Right Bundle VT with a northwest axis, and r/s less than one in V6 confirming a diagnosis of VT  2. CAD       Patent stents, no  New CAD      Last PCI was May 2017, will stop Plavix  3. HTN      his blood pressure has been fairly well controlled. Will follow.  Signed, Michael Jamaica, PA-C  06/18/2017, 10:46 AM    EP Attending  Patient seen and examined. Agree with above. He is stable to proceed with ICD insertion today. Will plan a venogram to be sure his subclavian vein is patent.  Michael Singleton.D.

## 2017-06-18 NOTE — Interval H&P Note (Signed)
History and Physical Interval Note:  06/18/2017 12:09 PM  Michael Singleton  has presented today for surgery, with the diagnosis of VT  The various methods of treatment have been discussed with the patient and family. After consideration of risks, benefits and other options for treatment, the patient has consented to  Procedure(s): ICD Implant (N/A) as a surgical intervention .  The patient's history has been reviewed, patient examined, no change in status, stable for surgery.  I have reviewed the patient's chart and labs.  Questions were answered to the patient's satisfaction.     Cristopher Peru

## 2017-06-19 ENCOUNTER — Inpatient Hospital Stay (HOSPITAL_COMMUNITY): Payer: Non-veteran care

## 2017-06-19 MED ORDER — SOTALOL HCL 80 MG PO TABS: 80.0000 mg | ORAL_TABLET | Freq: Two times a day (BID) | ORAL | 6 refills | Status: DC

## 2017-06-19 MED ORDER — MUPIROCIN 2 % EX OINT: 1.0000 "application " | TOPICAL_OINTMENT | Freq: Two times a day (BID) | CUTANEOUS | 0 refills | Status: AC

## 2017-06-19 MED FILL — Sodium Chloride Irrigation Soln 0.9%: Qty: 500 | Status: AC

## 2017-06-19 MED FILL — Gentamicin Sulfate Inj 40 MG/ML: INTRAMUSCULAR | Qty: 2 | Status: AC

## 2017-06-19 NOTE — Discharge Instructions (Signed)
° ° °  Post cardiac cath instructions (right groin) No lifting over 5 lbs for 1 week. No vigorous or sexual activity for 1 week. You may return to work on 06/24/17. Keep procedure site clean & dry. If you notice increased pain, swelling, bleeding or pus, call/return!  You may shower, but no soaking baths/hot tubs/pools for 1 week.      Supplemental Discharge Instructions for  Pacemaker/Defibrillator Patients  Activity No heavy lifting or vigorous activity with your left/right arm for 6 to 8 weeks.  Do not raise your left/right arm above your head for one week.  Gradually raise your affected arm as drawn below.              06/21/17                    06/22/17                    06/23/17                    06/24/17 __  NO DRIVING for 6 months.  WOUND CARE - Keep the wound area clean and dry.  Do not get this area wet for one week. No showers for one week; you may shower on 06/24/17 . - The tape/steri-strips on your wound will fall off; do not pull them off.  No bandage is needed on the site.  DO  NOT apply any creams, oils, or ointments to the wound area. - If you notice any drainage or discharge from the wound, any swelling or bruising at the site, or you develop a fever > 101? F after you are discharged home, call the office at once.  Special Instructions - You are still able to use cellular telephones; use the ear opposite the side where you have your pacemaker/defibrillator.  Avoid carrying your cellular phone near your device. - When traveling through airports, show security personnel your identification card to avoid being screened in the metal detectors.  Ask the security personnel to use the hand wand. - Avoid arc welding equipment, MRI testing (magnetic resonance imaging), TENS units (transcutaneous nerve stimulators).  Call the office for questions about other devices. - Avoid electrical appliances that are in poor condition or are not properly grounded. - Microwave ovens are safe to be  near or to operate.  Additional information for defibrillator patients should your device go off: - If your device goes off ONCE and you feel fine afterward, notify the device clinic nurses. - If your device goes off ONCE and you do not feel well afterward, call 911. - If your device goes off TWICE, call 911. - If your device goes off THREE times in one day, call 911.  DO NOT DRIVE YOURSELF OR A FAMILY MEMBER WITH A DEFIBRILLATOR TO THE HOSPITAL--CALL 911.

## 2017-06-19 NOTE — Progress Notes (Signed)
Returned from xray

## 2017-06-19 NOTE — Consult Note (Addendum)
           [facility] Primary Care Navigator  06/19/2017  Aniketh Huberty [birth year] [phone]   Went to see patientearlier at the bedsideto identify possible discharge needs but he was alreadydischarged.  Patient was discharged home today per staff report.  Primary care provider's officecalled (Tracy)confirmed that Dr. Dierdre Searles is patient's PCP and notified of patient's discharge and need for post hospital follow-up and transition of care. Also reminded of patient's health issues needing follow-up. Olivia Mackie states that patient is scheduled to see  PCP on 7/9.  Made aware to refer patient to [facility] care management if deemed appropriatefor services.    For questions, please contact:  Dannielle Huh, BSN, RN- [facility] Primary Care Navigator  Telephone: [phone] [place]

## 2017-06-19 NOTE — Care Management Note (Signed)
Case Management Note Marvetta Gibbons RN, BSN Unit 2W-Case Manager [phone]  Patient Details  Name: Michael Singleton MRN: [id number] Date of Birth: [birth year]  Subjective/Objective:  Pt admitted with chest pain, VT- s/p PPM on 6/26                  Action/Plan: PTA pt lived at home, independent- no CM needs noted for discharge.   Expected Discharge Date:  06/19/17               Expected Discharge Plan:  Home/Self Care  In-House Referral:     Discharge planning Services  CM Consult  Post Acute Care Choice:  NA Choice offered to:  NA  DME Arranged:    DME Agency:     HH Arranged:    HH Agency:     Status of Service:  Completed, signed off  If discussed at [place] of Stay Meetings, dates discussed:    Discharge Disposition: home/self care   Additional Comments:  Dawayne Patricia, RN 06/19/2017, 9:48 AM

## 2017-06-19 NOTE — Discharge Summary (Signed)
ELECTROPHYSIOLOGY PROCEDURE DISCHARGE SUMMARY    Patient ID: Michael Singleton,  MRN: [id number], DOB/AGE: [birth year] 62 y.o.  Admit date: 06/14/2017 Discharge date: 06/19/2017  Primary Care Physician: [facility]  Primary Cardiologist: Dr. McAlhaney/[facility] Electrophysiologist: Dr. Lovena Le  Primary Discharge Diagnosis:  1. Near syncope, VT     S/p  ICD implant this admission  Secondary Discharge Diagnosis:  1. CAD 2. HTN  Allergies  Allergen Reactions  . Fish Allergy Nausea And Vomiting  . Shellfish Allergy Nausea And Vomiting  . Tape Other (See Comments)    Plastic tape pulls off top layer of skin.  Paper/cloth tape ok     Procedures This Admission:   [place] 06/17/17, Dr. Gwenlyn Found  Mid LAD lesion, 0 %stenosed.  Prox LAD lesion, 30 %stenosed.  Ost RCA to Prox RCA lesion, 0 %stenosed.  Mid RCA lesion, 0 %stenosed.  Prox RCA lesion, 40 %stenosed.   1.  Implantation of a SJM dual chamber ICD on 06/18/17 by Dr Lovena Le.  The patient received a St. Jude (serial Number [id number]) ICD.  St. Jude (serial # [id number] ) right atrial lead and a St. Jude (serial number [id number]) right ventricular defibrillator lead  DFT's were successful at 20 J.   2.  No inducible VT or VF with PES There were no immediate post procedure complications. 3.  CXR on 06/19/17 demonstrated no pneumothorax status post device implantation.   Brief HPI: Michael Singleton is a 61 y.o. male was brought to [facility] via EMS with sudden onset of feeling tired, SOB, and chest pain after walking the dog. The chest pain was left-sided, described as a chest heaviness that radiated to his left arm. He was cold and clammy with associated diaphoresis. He denied palpitations. He became dizzy and sat down .  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  He was found in VT,spontaneously converting  enroute.  Hospital Course:  The patient was admitted and started on IV amiodarone, without further VT was stopped.  EP service consulted, Dr. Lovena Le noted was a Right Bundle VT with a northwest axis, and r/s less than one in V6 confirming a diagnosis of VT.    LVEF by echo was 50-55%, he underwent cardiac cath noting patent stents and no new CAD, yesterday underwent implantation of an ICD with details as outlined above. He was monitored on telemetry overnight which demonstrated A pacing/V sensing.  Left chest was without hematoma or ecchymosis.  The device was interrogated and found to be functioning normally.  CXR was obtained and demonstrated no pneumothorax status post device implantation.  Wound care, arm mobility, and restrictions were reviewed with the patient.  He is being started on Sotalol 80mg  BID, given ICD, no need to stay for initiation.  The patient has been made aware of [place] law, no driving 6 months given unstable VT.  The patient was examined by Dr. Lovena Le and considered stable for discharge to home.    Physical Exam: Vitals:   06/18/17 1445 06/18/17 1540 06/18/17 2100 06/19/17 0509  BP: (!) 144/70 (!) 118/57 135/63 (!) 116/53  Pulse: (!) 59 (!) 55 60 (!) 59  Resp: 10 16 18 18   Temp:  97.7 F (36.5 C) 97.8 F (36.6 C) 98.2 F (36.8 C)  TempSrc:  Oral Oral Oral  SpO2: 99% 90% 98% 94%  Weight:    272 lb 6.4 oz (123.6  kg)  Height:  6' (1.829 m)      GEN- The patient is well appearing, alert and oriented x 3 today.   HEENT: normocephalic, atraumatic; sclera clear, conjunctiva pink; hearing intact; oropharynx clear Lungs- CTA b/l, normal work of breathing.  No wheezes, rales, rhonchi Heart- RRR, no murmurs, rubs or gallops, PMI not laterally displaced GI- soft, non-tender, non-distended, bowel sounds present Extremities- no clubbing, cyanosis, or edema.  R groin (cath site) is soft, nontender, no hematoma, no bleeding MS- no significant deformity or atrophy Skin- warm and  dry, no rash or lesion, left chest without hematoma/ecchymosis Psych- euthymic mood, full affect Neuro- no gross defecits  Labs:   Lab Results  Component Value Date   WBC 6.8 06/18/2017   HGB 12.0 (L) 06/18/2017   HCT 38.4 (L) 06/18/2017   MCV 93.0 06/18/2017   PLT 178 06/18/2017    Recent Labs Lab 06/14/17 2106  06/18/17 0403  NA 138  < > 140  K 4.3  < > 4.6  CL 104  < > 105  CO2 25  < > 29  BUN 7  < > 9  CREATININE 0.96  < > 1.15  CALCIUM 9.7  < > 8.9  PROT 6.8  --   --   BILITOT 0.5  --   --   ALKPHOS 115  --   --   ALT 25  --   --   AST 31  --   --   GLUCOSE 114*  < > 85  < > = values in this interval not displayed.  Discharge Medications:  Allergies as of 06/19/2017      Reactions   Fish Allergy Nausea And Vomiting   Shellfish Allergy Nausea And Vomiting   Tape Other (See Comments)   Plastic tape pulls off top layer of skin. Paper/cloth tape ok      Medication List    TAKE these medications   aspirin 81 MG tablet Take 1 tablet (81 mg total) by mouth daily.   atorvastatin 80 MG tablet Commonly known as:  LIPITOR Take 1 tablet (80 mg total) by mouth daily.   buPROPion 300 MG 24 hr tablet Commonly known as:  WELLBUTRIN XL Take 300 mg by mouth daily.   citalopram 40 MG tablet Commonly known as:  CELEXA Take 40 mg by mouth daily.   clopidogrel 75 MG tablet Commonly known as:  PLAVIX Take 1 tablet (75 mg total) by mouth daily with breakfast.   gabapentin 300 MG capsule Commonly known as:  NEURONTIN Take 300 mg by mouth 3 (three) times daily.   levothyroxine 150 MCG tablet Commonly known as:  SYNTHROID, LEVOTHROID Take 1 tablet (150 mcg total) by mouth daily before breakfast.   metoprolol tartrate 25 MG tablet Commonly known as:  LOPRESSOR Take 1 tablet (25 mg total) by mouth 2 (two) times daily.   multivitamin with minerals Tabs tablet Take 1 tablet by mouth daily.   mupirocin ointment 2 % Commonly known as:  BACTROBAN Place 1  application into the nose 2 (two) times daily.   nitroGLYCERIN 0.4 MG SL tablet Commonly known as:  NITROSTAT Place 1 tablet (0.4 mg total) under the tongue every 5 (five) minutes x 3 doses as needed for chest pain.   pantoprazole 40 MG tablet Commonly known as:  PROTONIX Take 1 tablet (40 mg total) by mouth daily.   POTASSIUM CHLORIDE PO Take 1 tablet by mouth daily.   PRESCRIPTION MEDICATION Place 1 drop into  both eyes daily as needed (itchy eyes).   PRESCRIPTION MEDICATION Apply 1 application topically at bedtime. Cream from [place] for dermatitis   sotalol 80 MG tablet Commonly known as:  BETAPACE Take 1 tablet (80 mg total) by mouth 2 (two) times daily.   traZODone 100 MG tablet Commonly known as:  DESYREL Take 200 mg by mouth at bedtime.   Vitamin D3 2000 units Tabs Take 2,000 Units by mouth daily.       Disposition: Home  Discharge Instructions    AMB Referral to Cardiac Rehabilitation - Phase II    Complete by:  As directed    Diagnosis:  NSTEMI   Diet - low sodium heart healthy    Complete by:  As directed    Increase activity slowly    Complete by:  As directed      Follow-up Information    [place] [facility] Follow up on 07/03/2017.   Specialty:  Cardiology Why:  4:30PM, wound check Contact information: [address], [address] [place]       Evans Lance, MD Follow up on 09/17/2017.   Specialty:  Cardiology Why:  10:00AM Contact information: [address] [address] [phone]           Duration of Discharge Encounter: Greater than 30 minutes including physician time.  Venetia Night, PA-C 06/19/2017 8:58 AM  EP Attending   Patient seen and examined. Agree with above. He is doing well after ICD Insertion. His device has been interogated under my direct supervision.   Cristopher Peru, M.D.

## 2017-06-19 NOTE — Progress Notes (Signed)
EKG peformed as ordered.

## 2017-06-25 ENCOUNTER — Telehealth: Payer: Self-pay | Admitting: Internal Medicine

## 2017-06-25 NOTE — Telephone Encounter (Signed)
Pts daughter stated that she could not bring pt on 7/11. Apt made for 7/10 at 10am.

## 2017-06-25 NOTE — Telephone Encounter (Signed)
New message     Pt is changing his wound check appt , Michael Singleton out of office

## 2017-07-02 ENCOUNTER — Ambulatory Visit: Payer: Non-veteran care | Admitting: *Deleted

## 2017-07-03 ENCOUNTER — Ambulatory Visit: Payer: No Typology Code available for payment source

## 2017-07-05 ENCOUNTER — Ambulatory Visit (INDEPENDENT_AMBULATORY_CARE_PROVIDER_SITE_OTHER): Payer: Self-pay | Admitting: *Deleted

## 2017-07-05 DIAGNOSIS — I472 Ventricular tachycardia: Secondary | ICD-10-CM

## 2017-07-05 DIAGNOSIS — R000 Tachycardia, unspecified: Secondary | ICD-10-CM

## 2017-07-05 LAB — CUP PACEART INCLINIC DEVICE CHECK
Battery Remaining Longevity: 66 mo
Brady Statistic RA Percent Paced: 87 %
Date Time Interrogation Session: 20180710141642
HighPow Impedance: 64.125
Implantable Lead Implant Date: 20180626
Implantable Lead Implant Date: 20180626
Lead Channel Pacing Threshold Amplitude: 0.25 V
Lead Channel Pacing Threshold Amplitude: 0.75 V
Lead Channel Pacing Threshold Amplitude: 0.75 V
Lead Channel Pacing Threshold Pulse Width: 0.5 ms
Lead Channel Pacing Threshold Pulse Width: 0.5 ms
Lead Channel Pacing Threshold Pulse Width: 0.5 ms
Lead Channel Setting Sensing Sensitivity: 0.5 mV
MDC IDC LEAD LOCATION: 753859
MDC IDC LEAD LOCATION: 753860
MDC IDC MSMT LEADCHNL RA IMPEDANCE VALUE: 475 Ohm
MDC IDC MSMT LEADCHNL RA SENSING INTR AMPL: 5 mV
MDC IDC MSMT LEADCHNL RA SENSING INTR AMPL: 5 mV
MDC IDC MSMT LEADCHNL RV IMPEDANCE VALUE: 550 Ohm
MDC IDC MSMT LEADCHNL RV PACING THRESHOLD AMPLITUDE: 0.25 V
MDC IDC MSMT LEADCHNL RV PACING THRESHOLD PULSEWIDTH: 0.5 ms
MDC IDC MSMT LEADCHNL RV SENSING INTR AMPL: 11.7 mV
MDC IDC MSMT LEADCHNL RV SENSING INTR AMPL: 11.7 mV
MDC IDC PG IMPLANT DT: 20180626
MDC IDC SET LEADCHNL RA PACING AMPLITUDE: 3.5 V
MDC IDC SET LEADCHNL RV PACING AMPLITUDE: 3.5 V
MDC IDC SET LEADCHNL RV PACING PULSEWIDTH: 0.5 ms
MDC IDC STAT BRADY RV PERCENT PACED: 2.8 %
Pulse Gen Serial Number: 9763122

## 2017-07-10 DIAGNOSIS — I743 Embolism and thrombosis of arteries of the lower extremities: Secondary | ICD-10-CM | POA: Insufficient documentation

## 2017-07-10 LAB — CUP PACEART INCLINIC DEVICE CHECK
Battery Remaining Longevity: 69 mo
Brady Statistic RA Percent Paced: 87 %
Brady Statistic RV Percent Paced: 2.7 %
HighPow Impedance: 65.25 Ohm
Implantable Lead Implant Date: 20180626
Implantable Lead Location: 753860
Implantable Lead Model: 7122
Lead Channel Impedance Value: 575 Ohm
Lead Channel Sensing Intrinsic Amplitude: 5 mV
Lead Channel Setting Pacing Amplitude: 3.5 V
Lead Channel Setting Pacing Pulse Width: 0.5 ms
MDC IDC LEAD IMPLANT DT: 20180626
MDC IDC LEAD LOCATION: 753859
MDC IDC MSMT LEADCHNL RV IMPEDANCE VALUE: 550 Ohm
MDC IDC MSMT LEADCHNL RV SENSING INTR AMPL: 11.7 mV
MDC IDC PG IMPLANT DT: 20180626
MDC IDC PG SERIAL: 9763122
MDC IDC SESS DTM: 20180713140919
MDC IDC SET LEADCHNL RV PACING AMPLITUDE: 3.5 V
MDC IDC SET LEADCHNL RV SENSING SENSITIVITY: 0.5 mV

## 2017-07-10 NOTE — Progress Notes (Signed)
Patient presents to the office for reprogramming per GT. RA pulse width changed to 0.23ms, atrial sensitivity changed from "Auto" to 0.30mV. Patient to follow up as scheduled.

## 2017-07-16 ENCOUNTER — Telehealth: Payer: Self-pay | Admitting: Internal Medicine

## 2017-07-16 NOTE — Telephone Encounter (Signed)
[name] (pt fiance) calling, she would like to make Dr. Lovena Le aware that patient had ICD installed. 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 [name] states that patient was misdiagnosed by the [place].

## 2017-07-16 NOTE — Telephone Encounter (Signed)
Patient's fiancee calling and wanted to make Dr. 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 She states that he is scheduled for an arteriogram tomorrow to determine if he will need to have surgery on his right leg. Celesta Gentile made aware that the information would be forwarded to Dr. Lovena Le to make him aware.

## 2017-07-17 NOTE — Telephone Encounter (Signed)
Thank you. GT 

## 2017-08-05 DIAGNOSIS — Z89612 Acquired absence of left leg above knee: Secondary | ICD-10-CM | POA: Insufficient documentation

## 2017-09-06 ENCOUNTER — Encounter: Payer: Self-pay | Admitting: Internal Medicine

## 2017-09-17 ENCOUNTER — Ambulatory Visit (INDEPENDENT_AMBULATORY_CARE_PROVIDER_SITE_OTHER): Payer: Non-veteran care | Admitting: Internal Medicine

## 2017-09-17 ENCOUNTER — Encounter: Payer: Self-pay | Admitting: Internal Medicine

## 2017-09-17 VITALS — BP 118/70 | HR 65 | Ht 72.0 in | Wt 239.4 lb

## 2017-09-17 DIAGNOSIS — R000 Tachycardia, unspecified: Secondary | ICD-10-CM

## 2017-09-17 DIAGNOSIS — I472 Ventricular tachycardia: Secondary | ICD-10-CM

## 2017-09-17 LAB — CUP PACEART INCLINIC DEVICE CHECK
Battery Remaining Longevity: 86 mo
Brady Statistic RA Percent Paced: 87 %
Date Time Interrogation Session: 20180925104524
HIGH POWER IMPEDANCE MEASURED VALUE: 64.125
Implantable Lead Implant Date: 20180626
Implantable Lead Implant Date: 20180626
Implantable Lead Location: 753860
Lead Channel Impedance Value: 475 Ohm
Lead Channel Pacing Threshold Amplitude: 0.75 V
Lead Channel Pacing Threshold Amplitude: 0.75 V
Lead Channel Pacing Threshold Pulse Width: 0.5 ms
Lead Channel Pacing Threshold Pulse Width: 0.5 ms
Lead Channel Pacing Threshold Pulse Width: 0.5 ms
Lead Channel Sensing Intrinsic Amplitude: 11.7 mV
Lead Channel Setting Sensing Sensitivity: 0.5 mV
MDC IDC LEAD LOCATION: 753859
MDC IDC MSMT LEADCHNL RA IMPEDANCE VALUE: 487.5 Ohm
MDC IDC MSMT LEADCHNL RA SENSING INTR AMPL: 5 mV
MDC IDC MSMT LEADCHNL RV PACING THRESHOLD AMPLITUDE: 0.75 V
MDC IDC MSMT LEADCHNL RV PACING THRESHOLD AMPLITUDE: 0.75 V
MDC IDC MSMT LEADCHNL RV PACING THRESHOLD PULSEWIDTH: 0.5 ms
MDC IDC PG IMPLANT DT: 20180626
MDC IDC SET LEADCHNL RA PACING AMPLITUDE: 2 V
MDC IDC SET LEADCHNL RV PACING AMPLITUDE: 2.5 V
MDC IDC SET LEADCHNL RV PACING PULSEWIDTH: 0.5 ms
MDC IDC STAT BRADY RV PERCENT PACED: 1.8 %
Pulse Gen Serial Number: 9763122

## 2017-09-17 NOTE — Progress Notes (Signed)
HPI Michael Singleton returns today for ongoing evaluation and management of ventricular tachycardia. 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 He has undergone extensive rehab. He has had no ICD shocks. Denies chest pain.  Allergies  Allergen Reactions  . Fish Allergy Nausea And Vomiting  . Shellfish Allergy Nausea And Vomiting  . Tape Other (See Comments)    Plastic tape pulls off top layer of skin.  Paper/cloth tape ok  . Fish-Derived Products Nausea And Vomiting  . Morphine     Other reaction(s): Delusions (intolerance)     Current Outpatient Prescriptions  Medication Sig Dispense Refill  . aspirin 81 MG tablet Take 1 tablet (81 mg total) by mouth daily. 30 tablet   . atorvastatin (LIPITOR) 80 MG tablet Take 1 tablet (80 mg total) by mouth daily. 30 tablet 6  . buPROPion (WELLBUTRIN XL) 300 MG 24 hr tablet Take 300 mg by mouth daily.    . cholecalciferol 2000 UNITS TABS Take 2,000 Units by mouth daily. 90 tablet 3  . clopidogrel (PLAVIX) 75 MG tablet Take 1 tablet (75 mg total) by mouth daily with breakfast. 30 tablet 6  . levothyroxine (SYNTHROID, LEVOTHROID) 200 MCG tablet Take 200 mcg by mouth daily before breakfast.    . levothyroxine (SYNTHROID, LEVOTHROID) 25 MCG tablet Take 50 mcg by mouth daily before breakfast.    . metoprolol tartrate (LOPRESSOR) 25 MG tablet Take 1 tablet (25 mg total) by mouth 2 (two) times daily. 60 tablet 12  . Multiple Vitamin (MULTIVITAMIN WITH MINERALS) TABS tablet Take 1 tablet by mouth daily.    . nitroGLYCERIN (NITROSTAT) 0.4 MG SL tablet Place 1 tablet (0.4 mg total) under the tongue every 5 (five) minutes x 3 doses as needed for chest pain. 25 tablet 3  . pantoprazole  (PROTONIX) 40 MG tablet Take 1 tablet (40 mg total) by mouth daily. 30 tablet 6  . POTASSIUM CHLORIDE PO Take 1 tablet by mouth daily.    Marland Kitchen PRESCRIPTION MEDICATION Apply 1 application topically at bedtime. Cream from [place] for dermatitis    . PRESCRIPTION MEDICATION Place 1 drop into both eyes daily as needed (itchy eyes).    . sotalol (BETAPACE) 80 MG tablet Take 1 tablet (80 mg total) by mouth 2 (two) times daily. 60 tablet 6  . traZODone (DESYREL) 100 MG tablet Take 200 mg by mouth at bedtime.     No current facility-administered medications for this visit.      Past Medical History:  Diagnosis Date  . Allergic rhinitis, cause unspecified   . Anginal pain ([place])   . Anxiety   . Arthritis    "hands, neck, back, hips, knees" (05/17/2016)  . CAD (coronary artery disease)    a. history of sudden cardiac arrest, s/p MI's in 1995 and 1997 with prior stenting;  b. 07/2013 Cath/PCI: LM nl, LAD 50p/m, D1 nl, LCX nl, RI nl, RCA dominant, 99ost (3.0x20 Promus Premier DES)/39m(3.0x12 Promus Premier DES), EF 55% w/ inf HK.c. 04/2016 3.0 x 12 mm Promus Premier DES x 1 mid LAD   . Carotid artery disease ([place])    a. Dopplers 01/2012: 60-79% bilat carotid dz;  b. 07/2013 60-79% RICA stenosis & 40-08% LICA stenosis.  . Chronic lower back pain   .  Closed TBI (traumatic brain injury) ([place]) 1974   "memory issues since" (08/04/2013)  . Depression   . 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 s/p radical neck dissection. b. followed once a year - last check 09/2012 was OK.  . Diverticulosis of colon (without mention of hemorrhage)   . Falls frequently    "here lately" (05/17/2016)  . GERD (gastroesophageal reflux disease)   . Heart murmur   . Hyperlipemia   . Lumbago   . Lumbar disc disease    a. with chronic LBP  . Male infertility, unspecified   . Migraines    "monthly at least" (05/17/2016)  . Myocardial infarction ([place]) ~ 1995  . NHL (non-Hodgkin's lymphoma) ([place]) 10/09/2013  . OSA (obstructive sleep  apnea)    a. not using CPAP (05/17/2016)  . Posttraumatic stress disorder   . Unspecified essential hypertension   . Unspecified hypothyroidism   . Unspecified vitamin D deficiency 10/12/2013  . Ventricular bigeminy    a. pseudobradycardia 07/2013 - bigeminy tends to register low on HR monitors.    ROS:   All systems reviewed and negative except as noted in the HPI.   Past Surgical History:  Procedure Laterality Date  . CARDIAC CATHETERIZATION  05/04/2016  . CARDIAC CATHETERIZATION N/A 05/17/2016   Procedure: Coronary Stent Intervention;  Surgeon: Burnell Blanks, MD;  Location: [facility];  Service: Cardiovascular;  Laterality: N/A;  . CARDIAC CATHETERIZATION N/A 05/17/2016   Procedure: Left Heart Cath and Coronary Angiography;  Surgeon: Burnell Blanks, MD;  Location: [facility];  Service: Cardiovascular;  Laterality: N/A;  . CARDIAC CATHETERIZATION  05/17/2016   Procedure: Intravascular Pressure Wire/FFR Study;  Surgeon: Burnell Blanks, MD;  Location: [facility];  Service: Cardiovascular;;  . CORONARY ANGIOGRAPHY N/A 06/17/2017   Procedure: CORONARY ANGIOGRAPHY (CATH LAB);  Surgeon: Lorretta Harp, MD;  Location: [facility];  Service: Cardiovascular;  Laterality: N/A;  . [place]; 08/05/2013; 05/17/2016   "1 + 2 + 1"   . DEEP NECK LYMPH NODE BIOPSY / EXCISION Right 07/2010  . FINGER AMPUTATION Left 1980's   thumb  . ICD IMPLANT N/A 06/18/2017   Procedure: ICD Implant;  Surgeon: Evans Lance, MD;  Location: [facility];  Service: Cardiovascular;  Laterality: N/A;  . INGUINAL HERNIA REPAIR Left 2013  . LEFT HEART CATHETERIZATION WITH CORONARY ANGIOGRAM N/A 08/05/2013   Procedure: LEFT HEART CATHETERIZATION WITH CORONARY ANGIOGRAM;  Surgeon: Burnell Blanks, MD;  Location: [facility];  Service: Cardiovascular;  Laterality: N/A;  . RADICAL NECK DISSECTION Right ~ 09/2010   "'for  lymphoma;  (Dr Erik Obey)" (9/70/2637)  . SALIVARY GLAND SURGERY Right ~ 09/2010  . SHOULDER ARTHROSCOPY W/ ROTATOR CUFF REPAIR Right   . SHOULDER ARTHROSCOPY W/ ROTATOR CUFF REPAIR Left 2000's  . TONSILLECTOMY Right ~ 09/2010  . UMBILICAL HERNIA REPAIR  ?1990's     Family History  Problem Relation Age of Onset  . Heart attack Father        3 heart attacks   . Cancer Mother   . Heart disease Mother   . Alcohol abuse Other      Social History   Social History  . Marital status: Divorced    Spouse name: N/A  . Number of children: N/A  . Years of education: N/A   Occupational History  . Disabled    Social History Main Topics  .  Smoking status: Former Smoker    Packs/day: 1.50    Years: 35.00    Types: Cigarettes    Quit date: 04/11/2004  . Smokeless tobacco: Never Used  . Alcohol use Yes     Comment: Hx abuse, quit 2005  . Drug use: Yes    Types: Cocaine     Comment: 05/17/2016 "used all kinds of drugs; stopped in 2004"  . Sexual activity: Not Currently   Other Topics Concern  . 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  Lives with wife. They  lost her son in a motorcycle accident by a drunk driver.  Close to his 51 year old granddaughter.      Ht 6' (1.829 m)   Wt 239 lb 6.4 oz (108.6 kg)   BMI 32.47 kg/m   Physical Exam:  Well appearing 61 yo man, NAD HEENT: Unremarkable Neck:  6 cm JVD, no thyromegally Lymphatics:  No adenopathy Back:  No CVA tenderness Lungs:  Clear, with no wheezes HEART:  Regular rate rhythm, no murmurs, no rubs, no clicks Abd:  soft, positive bowel sounds, no organomegally, no rebound, no guarding Ext:  Right leg is bandaged and left leg with AKA Skin:  No rashes no nodules Neuro:  CN II through XII intact, motor grossly intact  EKG - NSR with PVC's  DEVICE  Normal device function.  See PaceArt for details.   Assess/Plan: 1. VT - he  is status post ICD insertion. He has had no recurrent symptoms. 2. ICD - his St. Jude ICD is working normally except for noted atrial lead noise. We have tried to program around this. His thresholds are good. 3. Peripheral vascular disease - he is status post left above-the-knee amputation. He appears to be healing well. He is pending left leg prosthesis placement.  Michael Singleton, M.D.

## 2017-09-17 NOTE — Patient Instructions (Addendum)
Medication Instructions:  Your physician recommends that you continue on your current medications as directed. Please refer to the Current Medication list given to you today.  Labwork: None ordered.  Testing/Procedures: None ordered.  Follow-Up: Your physician wants you to follow-up in: 9 months with Dr. Lovena Le.   You will receive a reminder letter in the mail two months in advance. If you don't receive a letter, please call our office to schedule the follow-up appointment.  Remote monitoring is used to monitor your ICD from home. This monitoring reduces the number of office visits required to check your device to one time per year. It allows [place] to keep an eye on the functioning of your device to ensure it is working properly. You are scheduled for a device check from home on 12/18/2017. You may send your transmission at any time that day. If you have a wireless device, the transmission will be sent automatically. After your physician reviews your transmission, you will receive a postcard with your next transmission date.    Any Other Special Instructions Will Be Listed Below (If Applicable).     If you need a refill on your cardiac medications before your next appointment, please call your pharmacy.

## 2017-09-27 DIAGNOSIS — M25512 Pain in left shoulder: Secondary | ICD-10-CM | POA: Diagnosis not present

## 2017-11-11 ENCOUNTER — Telehealth: Payer: Self-pay

## 2017-11-11 NOTE — Telephone Encounter (Signed)
Spoke with pt regarding ATP episode from 11/18/ 18 @ 11:00am pt didn't recall any symptoms pt reported that he had taken his Sotalol 80mg  that morning but had forgotten to take the Lopressor 50mg . Stressed importance of compliance to medications, pt aware of driving restrictions.

## 2018-02-17 ENCOUNTER — Telehealth: Payer: Self-pay | Admitting: *Deleted

## 2018-02-17 ENCOUNTER — Ambulatory Visit (INDEPENDENT_AMBULATORY_CARE_PROVIDER_SITE_OTHER): Payer: Non-veteran care | Admitting: Podiatry

## 2018-02-17 DIAGNOSIS — Z89512 Acquired absence of left leg below knee: Secondary | ICD-10-CM | POA: Diagnosis not present

## 2018-02-17 DIAGNOSIS — R0989 Other specified symptoms and signs involving the circulatory and respiratory systems: Secondary | ICD-10-CM

## 2018-02-17 DIAGNOSIS — I739 Peripheral vascular disease, unspecified: Secondary | ICD-10-CM | POA: Diagnosis not present

## 2018-02-17 DIAGNOSIS — I872 Venous insufficiency (chronic) (peripheral): Secondary | ICD-10-CM

## 2018-02-17 NOTE — Telephone Encounter (Signed)
Faxed clinicals, orders and demographics to [facility]:  Green Team for referral or prior authorization.

## 2018-02-17 NOTE — Telephone Encounter (Signed)
-----   Message from Evelina Bucy, DPM sent at 02/17/2018 10:19 AM EST ----- Regarding: Vascular studies Can we order vascular studies for this patient? He is an Technical sales engineer patient - we can do at [facility]  Note done :)

## 2018-02-17 NOTE — Progress Notes (Signed)
Subjective:  Patient ID: Michael Singleton, male    DOB: [birth year],  MRN: [id number]  Chief Complaint  Patient presents with  . Foot Swelling    foot and ankle swelling with neuropathy  hx of AKA left    62 y.o. male presents with the above complaint. Reports swelling to the R leg. Chronic in nature. Reports L AKA from sequela of blood clot to the LLE. On Plavix. Takes Lasix. 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  No other pedal concerns.  Past Medical History:  Diagnosis Date  . Allergic rhinitis, cause unspecified   . Anginal pain ([place])   . Anxiety   . Arthritis    "hands, neck, back, hips, knees" (05/17/2016)  . CAD (coronary artery disease)    a. history of sudden cardiac arrest, s/p MI's in 1995 and 1997 with prior stenting;  b. 07/2013 Cath/PCI: LM nl, LAD 50p/m, D1 nl, LCX nl, RI nl, RCA dominant, 99ost (3.0x20 Promus Premier DES)/57m(3.0x12 Promus Premier DES), EF 55% w/ inf HK.c. 04/2016 3.0 x 12 mm Promus Premier DES x 1 mid LAD   . Carotid artery disease ([place])    a. Dopplers 01/2012: 60-79% bilat carotid dz;  b. 07/2013 60-79% RICA stenosis & 19-37% LICA stenosis.  . Chronic lower back pain   . Closed TBI (traumatic brain injury) ([place]) 1974   "memory issues since" (08/04/2013)  . Depression   . 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 s/p radical neck dissection. b. followed once a year - last check 09/2012 was OK.  . Diverticulosis of colon (without mention of hemorrhage)   . Falls frequently    "here lately" (05/17/2016)  . GERD (gastroesophageal reflux disease)   . Heart murmur   . Hyperlipemia   . Lumbago   . Lumbar disc disease    a. with chronic LBP  . Male infertility, unspecified   . Migraines    "monthly at least" (05/17/2016)  . Myocardial infarction ([place]) ~ 1995  . NHL (non-Hodgkin's lymphoma) ([place]) 10/09/2013  . OSA (obstructive sleep apnea)    a. not using CPAP (05/17/2016)  . Posttraumatic stress disorder   . Unspecified essential hypertension   .  Unspecified hypothyroidism   . Unspecified vitamin D deficiency 10/12/2013  . Ventricular bigeminy    a. pseudobradycardia 07/2013 - bigeminy tends to register low on HR monitors.   Past Surgical History:  Procedure Laterality Date  . CARDIAC CATHETERIZATION  05/04/2016  . CARDIAC CATHETERIZATION N/A 05/17/2016   Procedure: Coronary Stent Intervention;  Surgeon: Burnell Blanks, MD;  Location: [facility];  Service: Cardiovascular;  Laterality: N/A;  . CARDIAC CATHETERIZATION N/A 05/17/2016   Procedure: Left Heart Cath and Coronary Angiography;  Surgeon: Burnell Blanks, MD;  Location: [facility];  Service: Cardiovascular;  Laterality: N/A;  . CARDIAC CATHETERIZATION  05/17/2016   Procedure: Intravascular Pressure Wire/FFR Study;  Surgeon: Burnell Blanks, MD;  Location: [facility];  Service: Cardiovascular;;  . CORONARY ANGIOGRAPHY N/A 06/17/2017   Procedure: CORONARY ANGIOGRAPHY (CATH LAB);  Surgeon: Lorretta Harp, MD;  Location: [facility];  Service: Cardiovascular;  Laterality: N/A;  . [place]; 08/05/2013; 05/17/2016   "1 + 2 + 1"   . DEEP NECK LYMPH NODE BIOPSY / EXCISION Right 07/2010  . FINGER AMPUTATION Left 1980's   thumb  . ICD IMPLANT N/A 06/18/2017   Procedure: ICD Implant;  Surgeon: Evans Lance,  MD;  Location: [place] [facility];  Service: Cardiovascular;  Laterality: N/A;  . INGUINAL HERNIA REPAIR Left 2013  . LEFT HEART CATHETERIZATION WITH CORONARY ANGIOGRAM N/A 08/05/2013   Procedure: LEFT HEART CATHETERIZATION WITH CORONARY ANGIOGRAM;  Surgeon: Burnell Blanks, MD;  Location[facility] C[facility];  Service: Cardiovascular;  Laterality: N/A;  . RADICAL NECK DISSECTION Right ~ 09/2010   "'for lymphoma;  (Dr Erik Obey)" (7/37/1062)  . SALIVARY GLAND SURGERY Right ~ 09/2010  . SHOULDER ARTHROSCOPY W/ ROTATOR CUFF REPAIR Right   . SHOULDER ARTHROSCOPY W/ ROTATOR CUFF REPAIR Left 2000's  .  TONSILLECTOMY Right ~ 09/2010  . UMBILICAL HERNIA REPAIR  ?1990's    Current Outpatient Medications:  .  aspirin 81 MG tablet, Take 1 tablet (81 mg total) by mouth daily., Disp: 30 tablet, Rfl:  .  atorvastatin (LIPITOR) 80 MG tablet, Take 1 tablet (80 mg total) by mouth daily., Disp: 30 tablet, Rfl: 6 .  buPROPion (WELLBUTRIN XL) 300 MG 24 hr tablet, Take 300 mg by mouth daily., Disp: , Rfl:  .  cholecalciferol 2000 UNITS TABS, Take 2,000 Units by mouth daily., Disp: 90 tablet, Rfl: 3 .  clopidogrel (PLAVIX) 75 MG tablet, Take 1 tablet (75 mg total) by mouth daily with breakfast., Disp: 30 tablet, Rfl: 6 .  gabapentin (NEURONTIN) 600 MG tablet, Take 600 mg by mouth 3 (three) times daily., Disp: , Rfl:  .  levothyroxine (SYNTHROID, LEVOTHROID) 200 MCG tablet, Take 200 mcg by mouth daily before breakfast., Disp: , Rfl:  .  levothyroxine (SYNTHROID, LEVOTHROID) 25 MCG tablet, Take 25 mcg by mouth daily before breakfast. , Disp: , Rfl:  .  metoprolol tartrate (LOPRESSOR) 50 MG tablet, Take 50 mg by mouth 2 (two) times daily., Disp: , Rfl:  .  Multiple Vitamin (MULTIVITAMIN WITH MINERALS) TABS tablet, Take 1 tablet by mouth daily., Disp: , Rfl:  .  nitroGLYCERIN (NITROSTAT) 0.4 MG SL tablet, Place 1 tablet (0.4 mg total) under the tongue every 5 (five) minutes x 3 doses as needed for chest pain., Disp: 25 tablet, Rfl: 3 .  pantoprazole (PROTONIX) 40 MG tablet, Take 1 tablet (40 mg total) by mouth daily., Disp: 30 tablet, Rfl: 6 .  POTASSIUM CHLORIDE PO, Take 1 tablet by mouth daily., Disp: , Rfl:  .  PRESCRIPTION MEDICATION, Apply 1 application topically at bedtime. Cream from [place] for dermatitis, Disp: , Rfl:  .  PRESCRIPTION MEDICATION, Place 1 drop into both eyes daily as needed (itchy eyes)., Disp: , Rfl:  .  sotalol (BETAPACE) 80 MG tablet, Take 1 tablet (80 mg total) by mouth 2 (two) times daily., Disp: 60 tablet, Rfl: 6 .  traZODone (DESYREL) 100 MG tablet, Take 200 mg by mouth at bedtime.,  Disp: , Rfl:   Allergies  Allergen Reactions  . Fish Allergy Nausea And Vomiting  . Shellfish Allergy Nausea And Vomiting  . Tape Other (See Comments)    Plastic tape pulls off top layer of skin.  Paper/cloth tape ok  . Fish-Derived Products Nausea And Vomiting  . Morphine     Other reaction(s): Delusions (intolerance)   Review of Systems Objective:  There were no vitals filed for this visit. General AA&O x3. Normal mood and affect.  Vascular Dorsalis pedis pulse 1/4 R, PT pulse faint R. Pitting edema RLE.  Neurologic Epicritic sensation grossly present R.  Dermatologic No open lesions. Interspaces clear of maceration. Nails well groomed and normal in appearance.  Orthopedic: MMT 5/5 in dorsiflexion, plantarflexion, inversion, and  eversion. Normal joint ROM without pain or crepitus. HX L AKA   Assessment & Plan:  Patient was evaluated and treated and all questions answered.  Venous Insuffiency R Leg -Discussed compression therapy -Information about compression socks given.  PAD with Hx of RLE Bypass -Order non-invasive vascular studies.  Return in about 6 weeks (around 03/31/2018) for follow up Vascular Studies.Marland Kitchen

## 2018-02-19 ENCOUNTER — Other Ambulatory Visit: Payer: Self-pay

## 2018-02-19 ENCOUNTER — Telehealth: Payer: Self-pay | Admitting: Podiatry

## 2018-02-19 NOTE — Progress Notes (Signed)
error 

## 2018-02-19 NOTE — Telephone Encounter (Signed)
[facility] scheduling dept called and said they need pts vascular studies order  to be changed to ABI study

## 2018-02-20 NOTE — Telephone Encounter (Signed)
It can be done at the [place]. Whichever is easier

## 2018-03-05 ENCOUNTER — Telehealth: Payer: Self-pay

## 2018-03-05 NOTE — Telephone Encounter (Signed)
Received notes from [facility]. Sent referral to scheduling, attached note to March file.

## 2018-03-10 ENCOUNTER — Encounter: Payer: Self-pay | Admitting: Internal Medicine

## 2018-03-10 ENCOUNTER — Ambulatory Visit (INDEPENDENT_AMBULATORY_CARE_PROVIDER_SITE_OTHER): Payer: No Typology Code available for payment source | Admitting: Internal Medicine

## 2018-03-10 VITALS — BP 142/70 | HR 60 | Ht 72.0 in | Wt 240.0 lb

## 2018-03-10 DIAGNOSIS — R000 Tachycardia, unspecified: Secondary | ICD-10-CM

## 2018-03-10 DIAGNOSIS — Z95810 Presence of automatic (implantable) cardiac defibrillator: Secondary | ICD-10-CM

## 2018-03-10 DIAGNOSIS — I472 Ventricular tachycardia: Secondary | ICD-10-CM

## 2018-03-10 NOTE — Progress Notes (Signed)
HPI [name] returns today for ongoing evaluation and management of ventricular tachycardia. 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 In the interim, he has fallen and noted to have malfunction of his atrial pacing lead and subsequent CXR demonstrates that the atrial lead has retracted into the SVC. I have not been able to see this Xray but his atrial lead is not working correctly. He has class 2-3 CHF symptoms. He has gained weight. He has not had any ICD therapies. Review of his old CXR demonstrates that the leads were in nice position back in June 2018.  Allergies  Allergen Reactions  . Fish Allergy Nausea And Vomiting  . Shellfish Allergy Nausea And Vomiting  . Tape Other (See Comments)    Plastic tape pulls off top layer of skin.  Paper/cloth tape ok  . Fish-Derived Products Nausea And Vomiting  . Morphine     Other reaction(s): Delusions (intolerance)     Current Outpatient Medications  Medication Sig Dispense Refill  . aspirin 81 MG tablet Take 1 tablet (81 mg total) by mouth daily. 30 tablet   . atorvastatin (LIPITOR) 80 MG tablet Take 1 tablet (80 mg total) by mouth daily. 30 tablet 6  . buPROPion (WELLBUTRIN XL) 300 MG 24 hr tablet Take 300 mg by mouth daily.    . cholecalciferol 2000 UNITS TABS Take 2,000 Units by mouth daily. 90 tablet 3  . clopidogrel (PLAVIX) 75 MG tablet Take 1 tablet (75 mg total) by mouth daily with breakfast. 30 tablet 6  . levothyroxine (SYNTHROID, LEVOTHROID) 200 MCG tablet Take 200 mcg by mouth daily before breakfast.    . levothyroxine (SYNTHROID, LEVOTHROID) 25 MCG tablet Take 25 mcg by mouth daily before breakfast.     . metoprolol tartrate (LOPRESSOR) 50 MG tablet Take 50 mg by mouth 2 (two) times daily.    . Multiple Vitamin (MULTIVITAMIN WITH MINERALS) TABS tablet Take 1  tablet by mouth daily.    . nitroGLYCERIN (NITROSTAT) 0.4 MG SL tablet Place 1 tablet (0.4 mg total) under the tongue every 5 (five) minutes x 3 doses as needed for chest pain. 25 tablet 3  . pantoprazole (PROTONIX) 40 MG tablet Take 1 tablet (40 mg total) by mouth daily. 30 tablet 6  . POTASSIUM CHLORIDE PO Take 1 tablet by mouth daily.    Marland Kitchen PRESCRIPTION MEDICATION Apply 1 application topically at bedtime. Cream from [place] for dermatitis    . PRESCRIPTION MEDICATION Place 1 drop into both eyes daily as needed (itchy eyes).    . sotalol (BETAPACE) 80 MG tablet Take 1 tablet (80 mg total) by mouth 2 (two) times daily. 60 tablet 6  . traZODone (DESYREL) 100 MG tablet Take 200 mg by mouth at bedtime.     No current facility-administered medications for this visit.      Past Medical History:  Diagnosis Date  . Allergic rhinitis, cause unspecified   . Anginal pain ([place])   . Anxiety   . Arthritis    "hands, neck, back, hips, knees" (05/17/2016)  . CAD (coronary artery disease)    a. history of sudden cardiac arrest, s/p MI's in 1995 and 1997 with prior stenting;  b. 07/2013 Cath/PCI: LM nl, LAD 50p/m, D1 nl, LCX nl, RI nl, RCA dominant, 99ost (3.0x20 Promus Premier DES)/63m(3.0x12 Promus Premier DES), EF 55% w/ inf HK.c. 04/2016 3.0 x  12 mm Promus Premier DES x 1 mid LAD   . Carotid artery disease ([place])    a. Dopplers 01/2012: 60-79% bilat carotid dz;  b. 07/2013 60-79% RICA stenosis & 09-62% LICA stenosis.  . Chronic lower back pain   . Closed TBI (traumatic brain injury) ([place]) 1974   "memory issues since" (08/04/2013)  . Depression   . 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 s/p radical neck dissection. b. followed once a year - last check 09/2012 was OK.  . Diverticulosis of colon (without mention of hemorrhage)   . Falls frequently    "here lately" (05/17/2016)  . GERD (gastroesophageal reflux disease)   . Heart murmur   . Hyperlipemia   . Lumbago   . Lumbar disc disease    a. with  chronic LBP  . Male infertility, unspecified   . Migraines    "monthly at least" (05/17/2016)  . Myocardial infarction ([place]) ~ 1995  . NHL (non-Hodgkin's lymphoma) ([place]) 10/09/2013  . OSA (obstructive sleep apnea)    a. not using CPAP (05/17/2016)  . Posttraumatic stress disorder   . Unspecified essential hypertension   . Unspecified hypothyroidism   . Unspecified vitamin D deficiency 10/12/2013  . Ventricular bigeminy    a. pseudobradycardia 07/2013 - bigeminy tends to register low on HR monitors.    ROS:   All systems reviewed and negative except as noted in the HPI.   Past Surgical History:  Procedure Laterality Date  . CARDIAC CATHETERIZATION  05/04/2016  . CARDIAC CATHETERIZATION N/A 05/17/2016   Procedure: Coronary Stent Intervention;  Surgeon: Burnell Blanks, MD;  Location: [facility];  Service: Cardiovascular;  Laterality: N/A;  . CARDIAC CATHETERIZATION N/A 05/17/2016   Procedure: Left Heart Cath and Coronary Angiography;  Surgeon: Burnell Blanks, MD;  Location: [facility];  Service: Cardiovascular;  Laterality: N/A;  . CARDIAC CATHETERIZATION  05/17/2016   Procedure: Intravascular Pressure Wire/FFR Study;  Surgeon: Burnell Blanks, MD;  Location: [place] [facility];  Service: Cardiovascular;;  . CORONARY ANGIOGRAPHY N/A 06/17/2017   Procedure: CORONARY ANGIOGRAPHY (CATH LAB);  Surgeon: Lorretta Harp, MD;  Location: [facility];  Service: Cardiovascular;  Laterality: N/A;  . [place]; 08/05/2013; 05/17/2016   "1 + 2 + 1"   . DEEP NECK LYMPH NODE BIOPSY / EXCISION Right 07/2010  . FINGER AMPUTATION Left 1980's   thumb  . ICD IMPLANT N/A 06/18/2017   Procedure: ICD Implant;  Surgeon: Evans Lance, MD;  Location: [facility] [facility];  Service: Cardiovascular;  Laterality: N/A;  . INGUINAL HERNIA REPAIR Left 2013  . LEFT HEART CATHETERIZATION WITH CORONARY ANGIOGRAM N/A 08/05/2013   Procedure:  LEFT HEART CATHETERIZATION WITH CORONARY ANGIOGRAM;  Surgeon: Burnell Blanks, MD;  Location: [facility];  Service: Cardiovascular;  Laterality: N/A;  . RADICAL NECK DISSECTION Right ~ 09/2010   "'for lymphoma;  (Dr Erik Obey)" (8/36/6294)  . SALIVARY GLAND SURGERY Right ~ 09/2010  . SHOULDER ARTHROSCOPY W/ ROTATOR CUFF REPAIR Right   . SHOULDER ARTHROSCOPY W/ ROTATOR CUFF REPAIR Left 2000's  . TONSILLECTOMY Right ~ 09/2010  . UMBILICAL HERNIA REPAIR  ?1990's     Family History  Problem Relation Age of Onset  . Heart attack Father        3 heart attacks   . Cancer Mother   . Heart disease Mother   . Alcohol abuse Other  Social History   Socioeconomic History  . Marital status: Divorced    Spouse name: Not on file  . Number of children: Not on file  . Years of education: Not on file  . Highest education level: Not on file  Social Needs  . Financial resource strain: Not on file  . Food insecurity - worry: Not on file  . Food insecurity - inability: Not on file  . Transportation needs - medical: Not on file  . Transportation needs - non-medical: Not on file  Occupational History  . Occupation: Disabled  Tobacco Use  . Smoking status: Former Smoker    Packs/day: 1.50    Years: 35.00    Pack years: 52.50    Types: Cigarettes    Last attempt to quit: 04/11/2004    Years since quitting: 13.9  . Smokeless tobacco: Never Used  Substance and Sexual Activity  . Alcohol use: Yes    Comment: Hx abuse, quit 2005  . Drug use: Yes    Types: Cocaine    Comment: 05/17/2016 "used all kinds of drugs; stopped in 2004"  . Sexual activity: Not Currently  Other Topics Concern  . 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  Lives with wife. They  lost her son in a motorcycle accident by a drunk driver.  Close to his 64 year old granddaughter.      BP (!) 142/70   Pulse 60   Ht 6'  (1.829 m)   Wt 240 lb (108.9 kg)   BMI 32.55 kg/m   Physical Exam:  stable appearing 61 yo man, NAD HEENT: Unremarkable Neck:  7 cm JVD, no thyromegally Lymphatics:  No adenopathy Back:  No CVA tenderness Lungs:  Clear with no wheezes HEART:  Regular rate rhythm, no murmurs, no rubs, no clicks Abd:  soft, positive bowel sounds, no organomegally, no rebound, no guarding Ext:  2 plus pulses, no edema, no cyanosis, no clubbing Skin:  No rashes no nodules Neuro:  CN II through XII intact, motor grossly intact  EKG - NSR with atrial pacing and intermittent non-capture  DEVICE  Normal device function except for atrial lead dislodgement.  See PaceArt for details.   Assess/Plan: 1. Atrial lead dislodgement - I have discussed the indications/risks/benefits/goals/expectation of PPM lead revision. I plan to place a new atrial lead and remove the old lead. As the lead has been indwelling less than a year, no surgical back up is indicated. 2. VT - he has not had any additional VT. Will follow. 3. Peripheral vascular disease - he is s/p amputation. No additional symptoms. 4. Obesity - once his device has been revised and he has healed, he will be encouraged to lose weight and increase his activity.  [name].D.

## 2018-03-10 NOTE — H&P (View-Only) (Signed)
HPI [name] returns today for ongoing evaluation and management of ventricular tachycardia. 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 In the interim, he has fallen and noted to have malfunction of his atrial pacing lead and subsequent CXR demonstrates that the atrial lead has retracted into the SVC. I have not been able to see this Xray but his atrial lead is not working correctly. He has class 2-3 CHF symptoms. He has gained weight. He has not had any ICD therapies. Review of his old CXR demonstrates that the leads were in nice position back in June 2018.  Allergies  Allergen Reactions  . Fish Allergy Nausea And Vomiting  . Shellfish Allergy Nausea And Vomiting  . Tape Other (See Comments)    Plastic tape pulls off top layer of skin.  Paper/cloth tape ok  . Fish-Derived Products Nausea And Vomiting  . Morphine     Other reaction(s): Delusions (intolerance)     Current Outpatient Medications  Medication Sig Dispense Refill  . aspirin 81 MG tablet Take 1 tablet (81 mg total) by mouth daily. 30 tablet   . atorvastatin (LIPITOR) 80 MG tablet Take 1 tablet (80 mg total) by mouth daily. 30 tablet 6  . buPROPion (WELLBUTRIN XL) 300 MG 24 hr tablet Take 300 mg by mouth daily.    . cholecalciferol 2000 UNITS TABS Take 2,000 Units by mouth daily. 90 tablet 3  . clopidogrel (PLAVIX) 75 MG tablet Take 1 tablet (75 mg total) by mouth daily with breakfast. 30 tablet 6  . levothyroxine (SYNTHROID, LEVOTHROID) 200 MCG tablet Take 200 mcg by mouth daily before breakfast.    . levothyroxine (SYNTHROID, LEVOTHROID) 25 MCG tablet Take 25 mcg by mouth daily before breakfast.     . metoprolol tartrate (LOPRESSOR) 50 MG tablet Take 50 mg by mouth 2 (two) times daily.    . Multiple Vitamin (MULTIVITAMIN WITH MINERALS) TABS tablet Take 1  tablet by mouth daily.    . nitroGLYCERIN (NITROSTAT) 0.4 MG SL tablet Place 1 tablet (0.4 mg total) under the tongue every 5 (five) minutes x 3 doses as needed for chest pain. 25 tablet 3  . pantoprazole (PROTONIX) 40 MG tablet Take 1 tablet (40 mg total) by mouth daily. 30 tablet 6  . POTASSIUM CHLORIDE PO Take 1 tablet by mouth daily.    Marland Kitchen PRESCRIPTION MEDICATION Apply 1 application topically at bedtime. Cream from [place] for dermatitis    . PRESCRIPTION MEDICATION Place 1 drop into both eyes daily as needed (itchy eyes).    . sotalol (BETAPACE) 80 MG tablet Take 1 tablet (80 mg total) by mouth 2 (two) times daily. 60 tablet 6  . traZODone (DESYREL) 100 MG tablet Take 200 mg by mouth at bedtime.     No current facility-administered medications for this visit.      Past Medical History:  Diagnosis Date  . Allergic rhinitis, cause unspecified   . Anginal pain ([place])   . Anxiety   . Arthritis    "hands, neck, back, hips, knees" (05/17/2016)  . CAD (coronary artery disease)    a. history of sudden cardiac arrest, s/p MI's in 1995 and 1997 with prior stenting;  b. 07/2013 Cath/PCI: LM nl, LAD 50p/m, D1 nl, LCX nl, RI nl, RCA dominant, 99ost (3.0x20 Promus Premier DES)/55m(3.0x12 Promus Premier DES), EF 55% w/ inf HK.c. 04/2016 3.0 x  12 mm Promus Premier DES x 1 mid LAD   . Carotid artery disease ([place])    a. Dopplers 01/2012: 60-79% bilat carotid dz;  b. 07/2013 60-79% RICA stenosis & 69-67% LICA stenosis.  . Chronic lower back pain   . Closed TBI (traumatic brain injury) ([place]) 1974   "memory issues since" (08/04/2013)  . Depression   . 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 s/p radical neck dissection. b. followed once a year - last check 09/2012 was OK.  . Diverticulosis of colon (without mention of hemorrhage)   . Falls frequently    "here lately" (05/17/2016)  . GERD (gastroesophageal reflux disease)   . Heart murmur   . Hyperlipemia   . Lumbago   . Lumbar disc disease    a. with  chronic LBP  . Male infertility, unspecified   . Migraines    "monthly at least" (05/17/2016)  . Myocardial infarction ([place]) ~ 1995  . NHL (non-Hodgkin's lymphoma) ([place]) 10/09/2013  . OSA (obstructive sleep apnea)    a. not using CPAP (05/17/2016)  . Posttraumatic stress disorder   . Unspecified essential hypertension   . Unspecified hypothyroidism   . Unspecified vitamin D deficiency 10/12/2013  . Ventricular bigeminy    a. pseudobradycardia 07/2013 - bigeminy tends to register low on HR monitors.    ROS:   All systems reviewed and negative except as noted in the HPI.   Past Surgical History:  Procedure Laterality Date  . CARDIAC CATHETERIZATION  05/04/2016  . CARDIAC CATHETERIZATION N/A 05/17/2016   Procedure: Coronary Stent Intervention;  Surgeon: Burnell Blanks, MD;  Location: [facility];  Service: Cardiovascular;  Laterality: N/A;  . CARDIAC CATHETERIZATION N/A 05/17/2016   Procedure: Left Heart Cath and Coronary Angiography;  Surgeon: Burnell Blanks, MD;  Location: [facility];  Service: Cardiovascular;  Laterality: N/A;  . CARDIAC CATHETERIZATION  05/17/2016   Procedure: Intravascular Pressure Wire/FFR Study;  Surgeon: Burnell Blanks, MD;  Location: [place] [place] [facility];  Service: Cardiovascular;;  . CORONARY ANGIOGRAPHY N/A 06/17/2017   Procedure: CORONARY ANGIOGRAPHY (CATH LAB);  Surgeon: Lorretta Harp, MD;  Location: [facility];  Service: Cardiovascular;  Laterality: N/A;  . [place]; 08/05/2013; 05/17/2016   "1 + 2 + 1"   . DEEP NECK LYMPH NODE BIOPSY / EXCISION Right 07/2010  . FINGER AMPUTATION Left 1980's   thumb  . ICD IMPLANT N/A 06/18/2017   Procedure: ICD Implant;  Surgeon: Evans Lance, MD;  Location: [place] [facility];  Service: Cardiovascular;  Laterality: N/A;  . INGUINAL HERNIA REPAIR Left 2013  . LEFT HEART CATHETERIZATION WITH CORONARY ANGIOGRAM N/A 08/05/2013   Procedure:  LEFT HEART CATHETERIZATION WITH CORONARY ANGIOGRAM;  Surgeon: Burnell Blanks, MD;  Location: [facility] C[facility] LAB;  Service: Cardiovascular;  Laterality: N/A;  . RADICAL NECK DISSECTION Right ~ 09/2010   "'for lymphoma;  (Dr Erik Obey)" (8/93/8101)  . SALIVARY GLAND SURGERY Right ~ 09/2010  . SHOULDER ARTHROSCOPY W/ ROTATOR CUFF REPAIR Right   . SHOULDER ARTHROSCOPY W/ ROTATOR CUFF REPAIR Left 2000's  . TONSILLECTOMY Right ~ 09/2010  . UMBILICAL HERNIA REPAIR  ?1990's     Family History  Problem Relation Age of Onset  . Heart attack Father        3 heart attacks   . Cancer Mother   . Heart disease Mother   . Alcohol abuse Other  Social History   Socioeconomic History  . Marital status: Divorced    Spouse name: Not on file  . Number of children: Not on file  . Years of education: Not on file  . Highest education level: Not on file  Social Needs  . Financial resource strain: Not on file  . Food insecurity - worry: Not on file  . Food insecurity - inability: Not on file  . Transportation needs - medical: Not on file  . Transportation needs - non-medical: Not on file  Occupational History  . Occupation: Disabled  Tobacco Use  . Smoking status: Former Smoker    Packs/day: 1.50    Years: 35.00    Pack years: 52.50    Types: Cigarettes    Last attempt to quit: 04/11/2004    Years since quitting: 13.9  . Smokeless tobacco: Never Used  Substance and Sexual Activity  . Alcohol use: Yes    Comment: Hx abuse, quit 2005  . Drug use: Yes    Types: Cocaine    Comment: 05/17/2016 "used all kinds of drugs; stopped in 2004"  . Sexual activity: Not Currently  Other Topics Concern  . 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  Lives with wife. They  lost her son in a motorcycle accident by a drunk driver.  Close to his 85 year old granddaughter.      BP (!) 142/70   Pulse 60   Ht 6'  (1.829 m)   Wt 240 lb (108.9 kg)   BMI 32.55 kg/m   Physical Exam:  stable appearing 62 yo man, NAD HEENT: Unremarkable Neck:  7 cm JVD, no thyromegally Lymphatics:  No adenopathy Back:  No CVA tenderness Lungs:  Clear with no wheezes HEART:  Regular rate rhythm, no murmurs, no rubs, no clicks Abd:  soft, positive bowel sounds, no organomegally, no rebound, no guarding Ext:  2 plus pulses, no edema, no cyanosis, no clubbing Skin:  No rashes no nodules Neuro:  CN II through XII intact, motor grossly intact  EKG - NSR with atrial pacing and intermittent non-capture  DEVICE  Normal device function except for atrial lead dislodgement.  See PaceArt for details.   Assess/Plan: 1. Atrial lead dislodgement - I have discussed the indications/risks/benefits/goals/expectation of PPM lead revision. I plan to place a new atrial lead and remove the old lead. As the lead has been indwelling less than a year, no surgical back up is indicated. 2. VT - he has not had any additional VT. Will follow. 3. Peripheral vascular disease - he is s/p amputation. No additional symptoms. 4. Obesity - once his device has been revised and he has healed, he will be encouraged to lose weight and increase his activity.  [name].D.

## 2018-03-10 NOTE — Patient Instructions (Signed)
Medication Instructions:  Your physician recommends that you continue on your current medications as directed. Please refer to the Current Medication list given to you today.  Labwork: You will get lab work tomorrow:  BMP and CBC.  Testing/Procedures: You will have your atrial lead removed and replaced.  Follow-Up: You will follow up with device clinic 10-14 days after your procedure for a wound check.  You will follow up with Dr.Taylor 91 days after your procedure.  Any Other Special Instructions Will Be Listed Below (If Applicable).  Please arrive at the [facility] main entrance of [facility] hospital at: 5:30 am on March 17, 2018 Use the CHG surgical scrub as directed Do not eat or drink after midnight prior to procedure Do NOT take your PLAVIX for 2 days prior to your procedure.  Your last dose will be March 14, 2018. Take all the rest of your medications with a sip of water.  Plan for one night stay.  You will need someone to drive you home at discharge  If you need a refill on your cardiac medications before your next appointment, please call your pharmacy.

## 2018-03-11 ENCOUNTER — Other Ambulatory Visit: Payer: Non-veteran care

## 2018-03-11 DIAGNOSIS — Z95810 Presence of automatic (implantable) cardiac defibrillator: Secondary | ICD-10-CM

## 2018-03-11 DIAGNOSIS — R000 Tachycardia, unspecified: Secondary | ICD-10-CM

## 2018-03-11 DIAGNOSIS — I472 Ventricular tachycardia: Secondary | ICD-10-CM

## 2018-03-11 LAB — CUP PACEART INCLINIC DEVICE CHECK
Brady Statistic RV Percent Paced: 27 %
HIGH POWER IMPEDANCE MEASURED VALUE: 77.625
Implantable Lead Implant Date: 20180626
Implantable Lead Location: 753859
Implantable Lead Location: 753860
Implantable Lead Model: 7122
Lead Channel Impedance Value: 487.5 Ohm
Lead Channel Pacing Threshold Amplitude: 1 V
Lead Channel Pacing Threshold Pulse Width: 0.4 ms
Lead Channel Pacing Threshold Pulse Width: 0.8 ms
Lead Channel Sensing Intrinsic Amplitude: 0.9 mV
Lead Channel Sensing Intrinsic Amplitude: 11.7 mV
Lead Channel Setting Pacing Amplitude: 2 V
Lead Channel Setting Pacing Amplitude: 4 V
Lead Channel Setting Pacing Pulse Width: 0.4 ms
MDC IDC LEAD IMPLANT DT: 20180626
MDC IDC MSMT BATTERY REMAINING LONGEVITY: 51 mo
MDC IDC MSMT LEADCHNL RA PACING THRESHOLD AMPLITUDE: 3.5 V
MDC IDC MSMT LEADCHNL RA PACING THRESHOLD AMPLITUDE: 3.5 V
MDC IDC MSMT LEADCHNL RA PACING THRESHOLD PULSEWIDTH: 0.8 ms
MDC IDC MSMT LEADCHNL RV IMPEDANCE VALUE: 525 Ohm
MDC IDC MSMT LEADCHNL RV PACING THRESHOLD AMPLITUDE: 1 V
MDC IDC MSMT LEADCHNL RV PACING THRESHOLD PULSEWIDTH: 0.4 ms
MDC IDC PG IMPLANT DT: 20180626
MDC IDC PG SERIAL: 9763122
MDC IDC SESS DTM: 20190318194439
MDC IDC SET LEADCHNL RV SENSING SENSITIVITY: 0.5 mV
MDC IDC STAT BRADY RA PERCENT PACED: 85 %

## 2018-03-12 LAB — CBC WITH DIFFERENTIAL/PLATELET
BASOS ABS: 0 10*3/uL (ref 0.0–0.2)
Basos: 1 %
EOS (ABSOLUTE): 0.2 10*3/uL (ref 0.0–0.4)
Eos: 3 %
Hematocrit: 34.7 % — ABNORMAL LOW (ref 37.5–51.0)
Hemoglobin: 11.1 g/dL — ABNORMAL LOW (ref 13.0–17.7)
Immature Grans (Abs): 0 10*3/uL (ref 0.0–0.1)
Immature Granulocytes: 0 %
LYMPHS ABS: 1.6 10*3/uL (ref 0.7–3.1)
LYMPHS: 28 %
MCH: 25.6 pg — AB (ref 26.6–33.0)
MCHC: 32 g/dL (ref 31.5–35.7)
MCV: 80 fL (ref 79–97)
Monocytes Absolute: 0.5 10*3/uL (ref 0.1–0.9)
Monocytes: 9 %
NEUTROS ABS: 3.4 10*3/uL (ref 1.4–7.0)
Neutrophils: 59 %
PLATELETS: 219 10*3/uL (ref 150–379)
RBC: 4.33 x10E6/uL (ref 4.14–5.80)
RDW: 16.3 % — ABNORMAL HIGH (ref 12.3–15.4)
WBC: 5.7 10*3/uL (ref 3.4–10.8)

## 2018-03-12 LAB — BASIC METABOLIC PANEL
BUN / CREAT RATIO: 10 (ref 10–24)
BUN: 9 mg/dL (ref 8–27)
CALCIUM: 9 mg/dL (ref 8.6–10.2)
CHLORIDE: 100 mmol/L (ref 96–106)
CO2: 25 mmol/L (ref 20–29)
Creatinine, Ser: 0.88 mg/dL (ref 0.76–1.27)
GFR calc Af Amer: 106 mL/min/{1.73_m2} (ref >59)
GFR calc non Af Amer: 92 mL/min/{1.73_m2} (ref >59)
GLUCOSE: 121 mg/dL — AB (ref 65–99)
POTASSIUM: 4.1 mmol/L (ref 3.5–5.2)
Sodium: 140 mmol/L (ref 134–144)

## 2018-03-17 ENCOUNTER — Encounter (HOSPITAL_COMMUNITY): Payer: Self-pay

## 2018-03-17 ENCOUNTER — Ambulatory Visit (HOSPITAL_COMMUNITY)
Admission: RE | Admit: 2018-03-17 | Discharge: 2018-03-18 | Disposition: A | Discharge status: Home / Self Care | Payer: Non-veteran care | Source: Ambulatory Visit | Attending: Internal Medicine | Admitting: Internal Medicine

## 2018-03-17 ENCOUNTER — Encounter (HOSPITAL_COMMUNITY)
Admission: RE | Disposition: A | Discharge status: Home / Self Care | Payer: Self-pay | Source: Ambulatory Visit | Attending: Internal Medicine

## 2018-03-17 ENCOUNTER — Other Ambulatory Visit: Payer: Self-pay

## 2018-03-17 DIAGNOSIS — I110 Hypertensive heart disease with heart failure: Secondary | ICD-10-CM | POA: Diagnosis not present

## 2018-03-17 DIAGNOSIS — Z006 Encounter for examination for normal comparison and control in clinical research program: Secondary | ICD-10-CM | POA: Diagnosis not present

## 2018-03-17 DIAGNOSIS — T82120A Displacement of cardiac electrode, initial encounter: Secondary | ICD-10-CM

## 2018-03-17 DIAGNOSIS — Z8674 Personal history of sudden cardiac arrest: Secondary | ICD-10-CM | POA: Insufficient documentation

## 2018-03-17 DIAGNOSIS — Z7902 Long term (current) use of antithrombotics/antiplatelets: Secondary | ICD-10-CM | POA: Diagnosis not present

## 2018-03-17 DIAGNOSIS — Z79890 Hormone replacement therapy (postmenopausal): Secondary | ICD-10-CM | POA: Diagnosis not present

## 2018-03-17 DIAGNOSIS — E669 Obesity, unspecified: Secondary | ICD-10-CM | POA: Insufficient documentation

## 2018-03-17 DIAGNOSIS — Z885 Allergy status to narcotic agent status: Secondary | ICD-10-CM | POA: Insufficient documentation

## 2018-03-17 DIAGNOSIS — Z91013 Allergy to seafood: Secondary | ICD-10-CM | POA: Diagnosis not present

## 2018-03-17 DIAGNOSIS — F4310 Post-traumatic stress disorder, unspecified: Secondary | ICD-10-CM | POA: Insufficient documentation

## 2018-03-17 DIAGNOSIS — E039 Hypothyroidism, unspecified: Secondary | ICD-10-CM | POA: Insufficient documentation

## 2018-03-17 DIAGNOSIS — Z6834 Body mass index (BMI) 34.0-34.9, adult: Secondary | ICD-10-CM | POA: Insufficient documentation

## 2018-03-17 DIAGNOSIS — I472 Ventricular tachycardia: Secondary | ICD-10-CM | POA: Diagnosis not present

## 2018-03-17 DIAGNOSIS — I252 Old myocardial infarction: Secondary | ICD-10-CM | POA: Diagnosis not present

## 2018-03-17 DIAGNOSIS — I509 Heart failure, unspecified: Secondary | ICD-10-CM | POA: Insufficient documentation

## 2018-03-17 DIAGNOSIS — E785 Hyperlipidemia, unspecified: Secondary | ICD-10-CM | POA: Insufficient documentation

## 2018-03-17 DIAGNOSIS — Z7982 Long term (current) use of aspirin: Secondary | ICD-10-CM | POA: Diagnosis not present

## 2018-03-17 DIAGNOSIS — I2510 Atherosclerotic heart disease of native coronary artery without angina pectoris: Secondary | ICD-10-CM | POA: Diagnosis not present

## 2018-03-17 DIAGNOSIS — Z87891 Personal history of nicotine dependence: Secondary | ICD-10-CM | POA: Diagnosis not present

## 2018-03-17 DIAGNOSIS — I739 Peripheral vascular disease, unspecified: Secondary | ICD-10-CM | POA: Insufficient documentation

## 2018-03-17 DIAGNOSIS — Y838 Other surgical procedures as the cause of abnormal reaction of the patient, or of later complication, without mention of misadventure at the time of the procedure: Secondary | ICD-10-CM | POA: Insufficient documentation

## 2018-03-17 DIAGNOSIS — Z79899 Other long term (current) drug therapy: Secondary | ICD-10-CM | POA: Diagnosis not present

## 2018-03-17 HISTORY — PX: LEAD REVISION/REPAIR: EP1213

## 2018-03-17 LAB — SURGICAL PCR SCREEN
MRSA, PCR: NEGATIVE
Staphylococcus aureus: NEGATIVE

## 2018-03-17 SURGERY — LEAD REVISION/REPAIR

## 2018-03-17 MED ORDER — ACETAMINOPHEN 325 MG PO TABS: 650.0000 mg | ORAL_TABLET | Freq: Four times a day (QID) | ORAL | Status: DC | PRN

## 2018-03-17 MED ORDER — MIDAZOLAM HCL 5 MG/5ML IJ SOLN
INTRAMUSCULAR | Status: AC
  Filled 2018-03-17: qty 5

## 2018-03-17 MED ORDER — LIDOCAINE HCL 1 % IJ SOLN
INTRAMUSCULAR | Status: AC
  Filled 2018-03-17: qty 20

## 2018-03-17 MED ORDER — FENTANYL CITRATE (PF) 100 MCG/2ML IJ SOLN
INTRAMUSCULAR | Status: AC
  Filled 2018-03-17: qty 2

## 2018-03-17 MED ORDER — TRAZODONE HCL 100 MG PO TABS
200.0000 mg | ORAL_TABLET | Freq: Every day | ORAL | Status: DC
  Administered 2018-03-17: 200 mg via ORAL
  Filled 2018-03-17: qty 2

## 2018-03-17 MED ORDER — DULOXETINE HCL 60 MG PO CPEP
60.0000 mg | ORAL_CAPSULE | Freq: Every day | ORAL | Status: DC
  Administered 2018-03-18: 60 mg via ORAL
  Filled 2018-03-17: qty 1

## 2018-03-17 MED ORDER — CLOPIDOGREL BISULFATE 75 MG PO TABS
75.0000 mg | ORAL_TABLET | Freq: Every day | ORAL | Status: DC
  Administered 2018-03-18: 75 mg via ORAL
  Filled 2018-03-17: qty 1

## 2018-03-17 MED ORDER — POTASSIUM CHLORIDE CRYS ER 20 MEQ PO TBCR
20.0000 meq | EXTENDED_RELEASE_TABLET | Freq: Every day | ORAL | Status: DC
  Administered 2018-03-18: 20 meq via ORAL
  Filled 2018-03-17: qty 1

## 2018-03-17 MED ORDER — ACETAMINOPHEN 325 MG PO TABS: 325.0000 mg | ORAL_TABLET | ORAL | Status: DC | PRN

## 2018-03-17 MED ORDER — MIDAZOLAM HCL 5 MG/5ML IJ SOLN
INTRAMUSCULAR | Status: DC | PRN
  Administered 2018-03-17: 2 mg via INTRAVENOUS
  Administered 2018-03-17 (×4): 1 mg via INTRAVENOUS

## 2018-03-17 MED ORDER — METOPROLOL SUCCINATE ER 50 MG PO TB24
50.0000 mg | ORAL_TABLET | Freq: Every evening | ORAL | Status: DC
  Administered 2018-03-17: 50 mg via ORAL
  Filled 2018-03-17: qty 1

## 2018-03-17 MED ORDER — FENTANYL CITRATE (PF) 100 MCG/2ML IJ SOLN
INTRAMUSCULAR | Status: DC | PRN
  Administered 2018-03-17: 25 ug via INTRAVENOUS
  Administered 2018-03-17 (×2): 12.5 ug via INTRAVENOUS
  Administered 2018-03-17: 25 ug via INTRAVENOUS

## 2018-03-17 MED ORDER — GENTAMICIN SULFATE 40 MG/ML IJ SOLN
INTRAMUSCULAR | Status: AC
  Filled 2018-03-17: qty 2

## 2018-03-17 MED ORDER — HEPARIN (PORCINE) IN NACL 2-0.9 UNIT/ML-% IJ SOLN
INTRAMUSCULAR | Status: AC
  Filled 2018-03-17: qty 500

## 2018-03-17 MED ORDER — HEPARIN (PORCINE) IN NACL 2-0.9 UNIT/ML-% IJ SOLN
INTRAMUSCULAR | Status: AC | PRN
  Administered 2018-03-17: 500 mL

## 2018-03-17 MED ORDER — BUPROPION HCL ER (XL) 300 MG PO TB24
300.0000 mg | ORAL_TABLET | Freq: Every day | ORAL | Status: DC
  Administered 2018-03-18: 300 mg via ORAL
  Filled 2018-03-17 (×2): qty 1

## 2018-03-17 MED ORDER — FUROSEMIDE 20 MG PO TABS
20.0000 mg | ORAL_TABLET | Freq: Every day | ORAL | Status: DC
  Administered 2018-03-18: 20 mg via ORAL
  Filled 2018-03-17: qty 1

## 2018-03-17 MED ORDER — FENTANYL CITRATE (PF) 100 MCG/2ML IJ SOLN
INTRAMUSCULAR | Status: AC
Start: 2018-03-17 — End: 2018-03-17
  Filled 2018-03-17: qty 2

## 2018-03-17 MED ORDER — SOTALOL HCL 80 MG PO TABS
80.0000 mg | ORAL_TABLET | Freq: Every day | ORAL | Status: DC
  Administered 2018-03-17: 80 mg via ORAL
  Filled 2018-03-17: qty 1

## 2018-03-17 MED ORDER — NITROGLYCERIN 0.4 MG SL SUBL: 0.4000 mg | SUBLINGUAL_TABLET | SUBLINGUAL | Status: DC | PRN

## 2018-03-17 MED ORDER — DOCUSATE SODIUM 100 MG PO CAPS: 100.0000 mg | ORAL_CAPSULE | Freq: Every day | ORAL | Status: DC | PRN

## 2018-03-17 MED ORDER — FENTANYL CITRATE (PF) 100 MCG/2ML IJ SOLN
25.0000 ug | INTRAMUSCULAR | Status: AC | PRN
  Administered 2018-03-17 (×2): 25 ug via INTRAVENOUS

## 2018-03-17 MED ORDER — CEFAZOLIN SODIUM-DEXTROSE 1-4 GM/50ML-% IV SOLN
1.0000 g | Freq: Four times a day (QID) | INTRAVENOUS | Status: AC
  Administered 2018-03-17 – 2018-03-18 (×3): 1 g via INTRAVENOUS
  Filled 2018-03-17 (×5): qty 50

## 2018-03-17 MED ORDER — SODIUM CHLORIDE 0.9 % IV SOLN
INTRAVENOUS | Status: DC
  Administered 2018-03-17: 07:00:00 via INTRAVENOUS

## 2018-03-17 MED ORDER — ASPIRIN EC 81 MG PO TBEC
81.0000 mg | DELAYED_RELEASE_TABLET | Freq: Every day | ORAL | Status: DC
  Administered 2018-03-17 – 2018-03-18 (×2): 81 mg via ORAL
  Filled 2018-03-17 (×2): qty 1

## 2018-03-17 MED ORDER — MUPIROCIN 2 % EX OINT
TOPICAL_OINTMENT | CUTANEOUS | Status: AC
  Administered 2018-03-17: 1
  Filled 2018-03-17: qty 22

## 2018-03-17 MED ORDER — ONDANSETRON HCL 4 MG/2ML IJ SOLN: 4.0000 mg | Freq: Four times a day (QID) | INTRAMUSCULAR | Status: DC | PRN

## 2018-03-17 MED ORDER — PANTOPRAZOLE SODIUM 40 MG PO TBEC
40.0000 mg | DELAYED_RELEASE_TABLET | Freq: Every day | ORAL | Status: DC
  Administered 2018-03-18: 40 mg via ORAL
  Filled 2018-03-17: qty 1

## 2018-03-17 MED ORDER — CHLORHEXIDINE GLUCONATE 4 % EX LIQD
60.0000 mL | Freq: Once | CUTANEOUS | Status: DC
  Filled 2018-03-17: qty 60

## 2018-03-17 MED ORDER — LEVOTHYROXINE SODIUM 25 MCG PO TABS
25.0000 ug | ORAL_TABLET | Freq: Every day | ORAL | Status: DC
  Administered 2018-03-18: 25 ug via ORAL
  Filled 2018-03-17: qty 1

## 2018-03-17 MED ORDER — ADULT MULTIVITAMIN W/MINERALS CH
1.0000 | ORAL_TABLET | Freq: Every day | ORAL | Status: DC
  Administered 2018-03-18: 1 via ORAL
  Filled 2018-03-17: qty 1

## 2018-03-17 MED ORDER — CEFAZOLIN SODIUM-DEXTROSE 2-4 GM/100ML-% IV SOLN
2.0000 g | INTRAVENOUS | Status: DC
  Filled 2018-03-17: qty 100

## 2018-03-17 MED ORDER — SOTALOL HCL 80 MG PO TABS
120.0000 mg | ORAL_TABLET | Freq: Every day | ORAL | Status: DC
  Administered 2018-03-18: 120 mg via ORAL
  Filled 2018-03-17: qty 2

## 2018-03-17 MED ORDER — SOTALOL HCL 80 MG PO TABS: 80.0000 mg | ORAL_TABLET | Freq: Two times a day (BID) | ORAL | Status: DC

## 2018-03-17 MED ORDER — SODIUM CHLORIDE 0.9 % IR SOLN
80.0000 mg | Status: AC
  Administered 2018-03-17: 80 mg
  Filled 2018-03-17: qty 2

## 2018-03-17 MED ORDER — VITAMIN D 1000 UNITS PO TABS
2000.0000 [IU] | ORAL_TABLET | Freq: Every day | ORAL | Status: DC
  Administered 2018-03-18: 2000 [IU] via ORAL
  Filled 2018-03-17: qty 2

## 2018-03-17 MED ORDER — DEXTROSE 5 % IV SOLN
3.0000 g | INTRAVENOUS | Status: AC
  Administered 2018-03-17: 3 g via INTRAVENOUS
  Filled 2018-03-17: qty 3000

## 2018-03-17 MED ORDER — LEVOTHYROXINE SODIUM 100 MCG PO TABS
200.0000 ug | ORAL_TABLET | Freq: Every day | ORAL | Status: DC
  Administered 2018-03-18: 200 ug via ORAL
  Filled 2018-03-17: qty 2

## 2018-03-17 MED ORDER — LIDOCAINE HCL (PF) 1 % IJ SOLN
INTRAMUSCULAR | Status: DC | PRN
  Administered 2018-03-17: 60 mL
  Administered 2018-03-17: 20 mL

## 2018-03-17 SURGICAL SUPPLY — 6 items
CABLE SURGICAL S-101-97-12 (CABLE) ×2 IMPLANT
LEAD TENDRIL MRI 52CM LPA1200M (Lead) ×2 IMPLANT
PAD DEFIB LIFELINK (PAD) ×2 IMPLANT
SET INTRODUCER MICROPUNCT 5F (INTRODUCER) ×2 IMPLANT
SHEATH CLASSIC 8F (SHEATH) ×2 IMPLANT
TRAY PACEMAKER INSERTION (PACKS) ×2 IMPLANT

## 2018-03-17 NOTE — Plan of Care (Signed)
  Problem: Activity: Goal: Risk for activity intolerance will decrease Outcome: Progressing  Pt. Tolerating activity at this time post op Problem: Elimination: Goal: Will not experience complications related to urinary retention Outcome: Progressing  Pt. Stated he needs assistance with voiding (needs to stand to use urinal, prosthetic leg at home) Problem: Safety: Goal: Ability to remain free from injury will improve Outcome: Progressing  Pt. Understands to call staff for assistance when needed

## 2018-03-17 NOTE — Interval H&P Note (Signed)
History and Physical Interval Note:  03/17/2018 7:31 AM  Michael Singleton  has presented today for surgery, with the diagnosis of atrial lead malfunction  The various methods of treatment have been discussed with the patient and family. After consideration of risks, benefits and other options for treatment, the patient has consented to  Procedure(s): ICD LEAD REVISION/REPAIR (N/A) as a surgical intervention .  The patient's history has been reviewed, patient examined, no change in status, stable for surgery.  I have reviewed the patient's chart and labs.  Questions were answered to the patient's satisfaction.     Cristopher Peru

## 2018-03-17 NOTE — Discharge Summary (Addendum)
ELECTROPHYSIOLOGY PROCEDURE DISCHARGE SUMMARY    Patient ID: Michael Singleton,  MRN: [id number], DOB/AGE: 03-21-1956 62 y.o.  Admit date: 03/17/2018 Discharge date: 03/18/18  Primary Care Physician: [facility]  Primary Cardiologist: Dr. Angelena Form Electrophysiologist: Dr. Lovena Le  Primary Discharge Diagnosis:  1. Atral lead dislodgement  Secondary Discharge Diagnosis:  1. VT w/ICD 2. CAD 3. Hypothyroidism  Allergies  Allergen Reactions  . Fish Allergy Nausea And Vomiting  . Shellfish Allergy Nausea And Vomiting  . Tape Other (See Comments)    Plastic tape pulls off top layer of skin. Paper/cloth tape ok  . Morphine Other (See Comments)    Delusions     Procedures This Admission:  1.  Removal of dislodged RA lead with implantation of a new RA ICD lead on 03/17/18 by Dr Lovena Le.  The patient received a St Jude atrial pacing lead, serial number [id number] There were no immediate post procedure complications. 2.  CXR on 03/18/18 demonstrated no pneumothorax status post device implantation.   Brief HPI: Michael Singleton is a 62 y.o. male is followed out patient.  Underwent ICD implant June 2018, more recently had a fall dislodging his RA lead.  Past medical history includes above.  Risks, benefits, and alternatives to lead revision were reviewed with the patient who wished to proceed.   Hospital Course:  The patient was admitted and underwent implantation of a RA lead removal with new RA lead placement with details as outlined above. He was monitored on telemetry overnight which demonstrated SR.  Left chest was without hematoma or ecchymosis.  The device was interrogated and found to be functioning normally.  CXR was obtained and demonstrated no pneumothorax status post device implantation.  Wound care, arm mobility, and restrictions were reviewed with the patient.  The patient was examined by Dr. Lovena Le and considered stable for discharge to home.    Physical  Exam: Vitals:   03/18/18 0020 03/18/18 0519 03/18/18 0829 03/18/18 0846  BP: (!) 128/59 (!) 112/51 (!) 144/64 (!) 143/71  Pulse: 69 63 63 62  Resp: 18 18 18    Temp: 98.6 F (37 C) 97.6 F (36.4 C) (!) 97.3 F (36.3 C)   TempSrc: Oral Oral Oral   SpO2: 97% 95% 96%   Weight:  250 lb (113.4 kg)    Height:        GEN- The patient is well appearing, alert and oriented x 3 today.   HEENT: normocephalic, atraumatic; sclera clear, conjunctiva pink; hearing intact; oropharynx clear; neck supple, no JVP Lungs- CTA b/l normal work of breathing.  No wheezes, rales, rhonchi Heart- RRR, no murmurs, rubs or gallops, PMI not laterally displaced GI- soft, non-tender, non-distended Extremities- no clubbing, cyanosis, L AKA, trace RLE edema MS- no significant deformity or atrophy Skin- warm and dry, no rash or lesion,  left chest without hematoma/ecchymosis Psych- euthymic mood, full affect Neuro- no gross deficits   Labs:   Lab Results  Component Value Date   WBC 5.7 03/11/2018   HGB 11.1 (L) 03/11/2018   HCT 34.7 (L) 03/11/2018   MCV 80 03/11/2018   PLT 219 03/11/2018    Recent Labs  Lab 03/11/18 1535  NA 140  K 4.1  CL 100  CO2 25  BUN 9  CREATININE 0.88  CALCIUM 9.0  GLUCOSE 121*    Discharge Medications:  Allergies as of 03/18/2018      Reactions   Fish Allergy Nausea And Vomiting   Shellfish Allergy Nausea And Vomiting  Tape Other (See Comments)   Plastic tape pulls off top layer of skin. Paper/cloth tape ok   Morphine Other (See Comments)   Delusions      Medication List    TAKE these medications   acetaminophen 325 MG tablet Commonly known as:  TYLENOL Take 650 mg by mouth every 6 (six) hours as needed for moderate pain or headache.   aspirin 81 MG tablet Take 1 tablet (81 mg total) by mouth daily.   atorvastatin 80 MG tablet Commonly known as:  LIPITOR Take 1 tablet (80 mg total) by mouth daily. What changed:  when to take this   buPROPion 300 MG 24  hr tablet Commonly known as:  WELLBUTRIN XL Take 300 mg by mouth daily.   clopidogrel 75 MG tablet Commonly known as:  PLAVIX Take 1 tablet (75 mg total) by mouth daily with breakfast.   docusate sodium 100 MG capsule Commonly known as:  COLACE Take 100 mg by mouth daily as needed for mild constipation.   DULoxetine 60 MG capsule Commonly known as:  CYMBALTA Take 60 mg by mouth daily.   furosemide 20 MG tablet Commonly known as:  LASIX Take 20 mg by mouth daily.   hydrocortisone 2.5 % cream Apply 1 application topically daily as needed (cracked skin).   levothyroxine 200 MCG tablet Commonly known as:  SYNTHROID, LEVOTHROID Take 200 mcg by mouth daily before breakfast.   levothyroxine 25 MCG tablet Commonly known as:  SYNTHROID, LEVOTHROID Take 25 mcg by mouth daily before breakfast.   metoprolol succinate 100 MG 24 hr tablet Commonly known as:  TOPROL-XL Take 50 mg by mouth every evening. Take with or immediately following a meal.   multivitamin with minerals Tabs tablet Take 1 tablet by mouth daily.   nitroGLYCERIN 0.4 MG SL tablet Commonly known as:  NITROSTAT Place 1 tablet (0.4 mg total) under the tongue every 5 (five) minutes x 3 doses as needed for chest pain.   pantoprazole 40 MG tablet Commonly known as:  PROTONIX Take 1 tablet (40 mg total) by mouth daily.   potassium chloride SA 20 MEQ tablet Commonly known as:  K-DUR,KLOR-CON Take 20 mEq by mouth daily.   sotalol 80 MG tablet Commonly known as:  BETAPACE Take 1-1.5 tablets (80-120 mg total) by mouth See admin instructions. Take 120 mg by mouth in the morning and 80 mg at night What changed:    how much to take  when to take this  additional instructions   traZODone 100 MG tablet Commonly known as:  DESYREL Take 200 mg by mouth at bedtime.   triamcinolone ointment 0.1 % Commonly known as:  KENALOG Apply 1 application topically daily as needed (keratosis).   Vitamin D3 2000 units  Tabs Take 2,000 Units by mouth daily.       Disposition:  Home  Discharge Instructions    Diet - low sodium heart healthy   Complete by:  As directed    Increase activity slowly   Complete by:  As directed      Follow-up Information    [facility] Follow up on 03/27/2018.   Specialty:  Cardiology Why:  9:00AM, wound check visit Contact information: [address], [address] [place]       Evans Lance, MD Follow up on 06/16/2018.   Specialty:  Cardiology Why:  11:45AM Contact information: [address] [place] [address] [phone]  Duration of Discharge Encounter: Greater than 30 minutes including physician time.  Venetia Night, PA-C 03/18/2018 10:49 AM  EP attending  Patient seen and examined.  ICD interrogation is been reviewed as has his chest x-ray.  He was stable for discharge home.  Usual follow-up.  His device has been interrogated under my direction and found to be working satisfactorily.  Cristopher Peru, MD

## 2018-03-17 NOTE — Discharge Instructions (Signed)
° ° °  Supplemental Discharge Instructions for  °Pacemaker/Defibrillator Patients ° °Activity °No heavy lifting or vigorous activity with your left/right arm for 6 to 8 weeks.  Do not raise your left/right arm above your head for one week.  Gradually raise your affected arm as drawn below. ° °        °    03/21/18                     03/22/18                    03/23/18                  03/24/18 °__ ° °NO DRIVING for  1 week   ; you may begin driving on   03/24/18  . ° °WOUND CARE °- Keep the wound area clean and dry.  Do not get this area wet for one week. No showers for one week; you may shower on 03/24/18  . °- The tape/steri-strips on your wound will fall off; do not pull them off.  No bandage is needed on the site.  DO  NOT apply any creams, oils, or ointments to the wound area. °- If you notice any drainage or discharge from the wound, any swelling or bruising at the site, or you develop a fever > 101? F after you are discharged home, call the office at once. ° °Special Instructions °- You are still able to use cellular telephones; use the ear opposite the side where you have your pacemaker/defibrillator.  Avoid carrying your cellular phone near your device. °- When traveling through airports, show security personnel your identification card to avoid being screened in the metal detectors.  Ask the security personnel to use the hand wand. °- Avoid arc welding equipment, MRI testing (magnetic resonance imaging), TENS units (transcutaneous nerve stimulators).  Call the office for questions about other devices. °- Avoid electrical appliances that are in poor condition or are not properly grounded. °- Microwave ovens are safe to be near or to operate. ° °Additional information for defibrillator patients should your device go off: °- If your device goes off ONCE and you feel fine afterward, notify the device clinic nurses. °- If your device goes off ONCE and you do not feel well afterward, call 911. °- If your device goes off  TWICE, call 911. °- If your device goes off THREE times in one day, call 911. ° °DO NOT DRIVE YOURSELF OR A FAMILY MEMBER °WITH A DEFIBRILLATOR TO THE HOSPITAL--CALL 911. ° °

## 2018-03-17 NOTE — Progress Notes (Addendum)
Pt. Arrived to unit in alert and stable condition. No s/s of distress noted. Pt. Stated he has some discomfort at upper left chest incision site. Site assessed, dressing clean, dry, and intact. Pt. Oriented to room and placed on telemetry. CCMD notified. Call light placed within reach. VS obtained.  RN will continue to monitor pt. For changes in condition. Tanijah Morais, Katherine Roan

## 2018-03-18 ENCOUNTER — Encounter (HOSPITAL_COMMUNITY): Payer: Self-pay | Admitting: Internal Medicine

## 2018-03-18 ENCOUNTER — Ambulatory Visit (HOSPITAL_COMMUNITY): Payer: Non-veteran care

## 2018-03-18 DIAGNOSIS — I472 Ventricular tachycardia: Secondary | ICD-10-CM

## 2018-03-18 DIAGNOSIS — Z006 Encounter for examination for normal comparison and control in clinical research program: Secondary | ICD-10-CM | POA: Diagnosis not present

## 2018-03-18 DIAGNOSIS — T82120A Displacement of cardiac electrode, initial encounter: Secondary | ICD-10-CM | POA: Diagnosis not present

## 2018-03-18 DIAGNOSIS — Z885 Allergy status to narcotic agent status: Secondary | ICD-10-CM | POA: Diagnosis not present

## 2018-03-18 DIAGNOSIS — Z91013 Allergy to seafood: Secondary | ICD-10-CM | POA: Diagnosis not present

## 2018-03-18 MED ORDER — SOTALOL HCL 80 MG PO TABS: 80.0000 mg | ORAL_TABLET | ORAL | 3 refills | Status: AC

## 2018-03-18 MED FILL — Lidocaine HCl Local Inj 1%: INTRAMUSCULAR | Qty: 80 | Status: AC

## 2018-03-18 NOTE — Progress Notes (Signed)
Pt given discharge instructions and verbalized understanding. Pt discharged home via wc.

## 2018-03-27 ENCOUNTER — Ambulatory Visit (INDEPENDENT_AMBULATORY_CARE_PROVIDER_SITE_OTHER): Payer: No Typology Code available for payment source | Admitting: *Deleted

## 2018-03-27 DIAGNOSIS — I255 Ischemic cardiomyopathy: Secondary | ICD-10-CM | POA: Diagnosis not present

## 2018-03-27 LAB — CUP PACEART INCLINIC DEVICE CHECK
Brady Statistic RA Percent Paced: 86 %
Brady Statistic RV Percent Paced: 3.8 %
HighPow Impedance: 67.5 Ohm
Implantable Lead Implant Date: 20180325
Implantable Lead Location: 753859
Implantable Lead Model: 7122
Implantable Pulse Generator Implant Date: 20180626
Lead Channel Impedance Value: 512.5 Ohm
Lead Channel Pacing Threshold Amplitude: 0.5 V
Lead Channel Pacing Threshold Amplitude: 0.5 V
Lead Channel Pacing Threshold Amplitude: 0.75 V
Lead Channel Pacing Threshold Pulse Width: 0.4 ms
Lead Channel Pacing Threshold Pulse Width: 0.4 ms
Lead Channel Sensing Intrinsic Amplitude: 11.7 mV
Lead Channel Sensing Intrinsic Amplitude: 5 mV
MDC IDC LEAD IMPLANT DT: 20180626
MDC IDC LEAD LOCATION: 753860
MDC IDC MSMT BATTERY REMAINING LONGEVITY: 63 mo
MDC IDC MSMT LEADCHNL RA PACING THRESHOLD PULSEWIDTH: 0.4 ms
MDC IDC MSMT LEADCHNL RV IMPEDANCE VALUE: 512.5 Ohm
MDC IDC MSMT LEADCHNL RV PACING THRESHOLD AMPLITUDE: 0.75 V
MDC IDC MSMT LEADCHNL RV PACING THRESHOLD PULSEWIDTH: 0.4 ms
MDC IDC PG SERIAL: 9763122
MDC IDC SESS DTM: 20190404094438
MDC IDC SET LEADCHNL RA PACING AMPLITUDE: 3.5 V
MDC IDC SET LEADCHNL RV PACING AMPLITUDE: 2 V
MDC IDC SET LEADCHNL RV PACING PULSEWIDTH: 0.4 ms
MDC IDC SET LEADCHNL RV SENSING SENSITIVITY: 0.5 mV

## 2018-03-27 NOTE — Progress Notes (Signed)
Wound check appointment s/p atrial lead revision. Steri strips removed. Wound without redness or edema. Incision edges approximated, wound well healed.  Device interrogated by industry. Normal device function. Thresholds, sensing, and impedances consistent with implant measurements. Device programmed at 3.5V for extra safety margin until 3 month visit. Histogram distribution appropriate for patient and level of activity. 408 AMS episodes - EGMs show noise that was reproducible during impedance testing and consistent with auto testing times. Atrial sensitivity decreased to 17mV (P waves >86mV). Auto mode switch turned off. LRL increased to 70, decreasing PVC frequency. Patient educated about wound care, arm mobility, lifting restrictions, shock plan. ROV with GT 6/24

## 2018-03-31 ENCOUNTER — Ambulatory Visit: Payer: Non-veteran care | Admitting: Podiatry

## 2018-04-01 ENCOUNTER — Ambulatory Visit: Payer: Non-veteran care | Admitting: Podiatry

## 2018-06-16 ENCOUNTER — Ambulatory Visit (INDEPENDENT_AMBULATORY_CARE_PROVIDER_SITE_OTHER): Payer: No Typology Code available for payment source | Admitting: Internal Medicine

## 2018-06-16 ENCOUNTER — Encounter: Payer: Self-pay | Admitting: Internal Medicine

## 2018-06-16 VITALS — BP 140/82 | HR 87 | Ht 72.0 in | Wt 246.6 lb

## 2018-06-16 DIAGNOSIS — I472 Ventricular tachycardia: Secondary | ICD-10-CM | POA: Diagnosis not present

## 2018-06-16 DIAGNOSIS — Z95810 Presence of automatic (implantable) cardiac defibrillator: Secondary | ICD-10-CM

## 2018-06-16 DIAGNOSIS — R000 Tachycardia, unspecified: Secondary | ICD-10-CM
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
## 2018-06-16 NOTE — Patient Instructions (Signed)
Medication Instructions:  Your physician recommends that you continue on your current medications as directed. Please refer to the Current Medication list given to you today.  Check your blood pressure weekly.  IF you fall check your blood pressure then.  Labwork: None ordered.  Testing/Procedures: None ordered.  Follow-Up: Your physician wants you to follow-up in: one year with Dr. Lovena Le.   You will receive a reminder letter in the mail two months in advance. If you don't receive a letter, please call our office to schedule the follow-up appointment.  Any Other Special Instructions Will Be Listed Below (If Applicable).  If you need a refill on your cardiac medications before your next appointment, please call your pharmacy.

## 2018-07-11 ENCOUNTER — Telehealth: Payer: Self-pay | Admitting: Podiatry

## 2018-07-11 NOTE — Telephone Encounter (Signed)
This is Diane with [facility]. I'm calling to get the office visit notes from date of service 17 February 2018. If you could fax those to me at [phone] we would appreciate it. If you have any questions you can call [phone] extension [phone]. Thank you.

## 2019-01-16 ENCOUNTER — Telehealth: Payer: Self-pay | Admitting: Hematology and Oncology

## 2019-01-16 NOTE — Telephone Encounter (Signed)
Faxed medical records to Department of [facility], Release [phone]

## 2020-11-10 ENCOUNTER — Emergency Department (HOSPITAL_COMMUNITY): Payer: No Typology Code available for payment source

## 2020-11-10 ENCOUNTER — Encounter (HOSPITAL_COMMUNITY): Payer: Self-pay | Admitting: Internal Medicine

## 2020-11-10 ENCOUNTER — Other Ambulatory Visit: Payer: Self-pay

## 2020-11-10 ENCOUNTER — Inpatient Hospital Stay (HOSPITAL_COMMUNITY)
Admission: EM | Admit: 2020-11-10 | Discharge: 2020-11-23 | DRG: 177 | Disposition: E | Payer: No Typology Code available for payment source | Attending: Internal Medicine | Admitting: Internal Medicine

## 2020-11-10 DIAGNOSIS — M479 Spondylosis, unspecified: Secondary | ICD-10-CM | POA: Diagnosis present

## 2020-11-10 DIAGNOSIS — E785 Hyperlipidemia, unspecified: Secondary | ICD-10-CM | POA: Diagnosis present

## 2020-11-10 DIAGNOSIS — F05 Delirium due to known physiological condition: Secondary | ICD-10-CM | POA: Diagnosis not present

## 2020-11-10 DIAGNOSIS — E871 Hypo-osmolality and hyponatremia: Secondary | ICD-10-CM | POA: Diagnosis present

## 2020-11-10 DIAGNOSIS — Z8719 Personal history of other diseases of the digestive system: Secondary | ICD-10-CM

## 2020-11-10 DIAGNOSIS — I110 Hypertensive heart disease with heart failure: Secondary | ICD-10-CM | POA: Diagnosis present

## 2020-11-10 DIAGNOSIS — F32A Depression, unspecified: Secondary | ICD-10-CM | POA: Diagnosis present

## 2020-11-10 DIAGNOSIS — G4700 Insomnia, unspecified: Secondary | ICD-10-CM | POA: Diagnosis present

## 2020-11-10 DIAGNOSIS — E039 Hypothyroidism, unspecified: Secondary | ICD-10-CM | POA: Diagnosis present

## 2020-11-10 DIAGNOSIS — I472 Ventricular tachycardia, unspecified: Secondary | ICD-10-CM

## 2020-11-10 DIAGNOSIS — Z8673 Personal history of transient ischemic attack (TIA), and cerebral infarction without residual deficits: Secondary | ICD-10-CM

## 2020-11-10 DIAGNOSIS — M170 Bilateral primary osteoarthritis of knee: Secondary | ICD-10-CM | POA: Diagnosis present

## 2020-11-10 DIAGNOSIS — Z89022 Acquired absence of left finger(s): Secondary | ICD-10-CM

## 2020-11-10 DIAGNOSIS — Z885 Allergy status to narcotic agent status: Secondary | ICD-10-CM

## 2020-11-10 DIAGNOSIS — I5032 Chronic diastolic (congestive) heart failure: Secondary | ICD-10-CM | POA: Diagnosis present

## 2020-11-10 DIAGNOSIS — G9341 Metabolic encephalopathy: Secondary | ICD-10-CM

## 2020-11-10 DIAGNOSIS — Z515 Encounter for palliative care: Secondary | ICD-10-CM | POA: Diagnosis not present

## 2020-11-10 DIAGNOSIS — J9602 Acute respiratory failure with hypercapnia: Secondary | ICD-10-CM | POA: Diagnosis not present

## 2020-11-10 DIAGNOSIS — Z7902 Long term (current) use of antithrombotics/antiplatelets: Secondary | ICD-10-CM

## 2020-11-10 DIAGNOSIS — G4733 Obstructive sleep apnea (adult) (pediatric): Secondary | ICD-10-CM | POA: Diagnosis present

## 2020-11-10 DIAGNOSIS — R0603 Acute respiratory distress: Secondary | ICD-10-CM

## 2020-11-10 DIAGNOSIS — Z89612 Acquired absence of left leg above knee: Secondary | ICD-10-CM

## 2020-11-10 DIAGNOSIS — C8331 Diffuse large B-cell lymphoma, lymph nodes of head, face, and neck: Secondary | ICD-10-CM | POA: Diagnosis present

## 2020-11-10 DIAGNOSIS — Z6841 Body Mass Index (BMI) 40.0 and over, adult: Secondary | ICD-10-CM

## 2020-11-10 DIAGNOSIS — Z7982 Long term (current) use of aspirin: Secondary | ICD-10-CM

## 2020-11-10 DIAGNOSIS — I70202 Unspecified atherosclerosis of native arteries of extremities, left leg: Secondary | ICD-10-CM | POA: Diagnosis present

## 2020-11-10 DIAGNOSIS — Z66 Do not resuscitate: Secondary | ICD-10-CM | POA: Diagnosis not present

## 2020-11-10 DIAGNOSIS — Z87891 Personal history of nicotine dependence: Secondary | ICD-10-CM

## 2020-11-10 DIAGNOSIS — J1282 Pneumonia due to coronavirus disease 2019: Secondary | ICD-10-CM | POA: Diagnosis present

## 2020-11-10 DIAGNOSIS — G8929 Other chronic pain: Secondary | ICD-10-CM | POA: Diagnosis present

## 2020-11-10 DIAGNOSIS — Z955 Presence of coronary angioplasty implant and graft: Secondary | ICD-10-CM

## 2020-11-10 DIAGNOSIS — J80 Acute respiratory distress syndrome: Secondary | ICD-10-CM | POA: Diagnosis present

## 2020-11-10 DIAGNOSIS — L8990 Pressure ulcer of unspecified site, unspecified stage: Secondary | ICD-10-CM

## 2020-11-10 DIAGNOSIS — K219 Gastro-esophageal reflux disease without esophagitis: Secondary | ICD-10-CM | POA: Diagnosis present

## 2020-11-10 DIAGNOSIS — J9601 Acute respiratory failure with hypoxia: Secondary | ICD-10-CM | POA: Diagnosis present

## 2020-11-10 DIAGNOSIS — R7989 Other specified abnormal findings of blood chemistry: Secondary | ICD-10-CM | POA: Diagnosis present

## 2020-11-10 DIAGNOSIS — I252 Old myocardial infarction: Secondary | ICD-10-CM

## 2020-11-10 DIAGNOSIS — I2510 Atherosclerotic heart disease of native coronary artery without angina pectoris: Secondary | ICD-10-CM | POA: Diagnosis present

## 2020-11-10 DIAGNOSIS — I5030 Unspecified diastolic (congestive) heart failure: Secondary | ICD-10-CM | POA: Diagnosis not present

## 2020-11-10 DIAGNOSIS — E870 Hyperosmolality and hypernatremia: Secondary | ICD-10-CM | POA: Diagnosis not present

## 2020-11-10 DIAGNOSIS — Z79899 Other long term (current) drug therapy: Secondary | ICD-10-CM

## 2020-11-10 DIAGNOSIS — Z789 Other specified health status: Secondary | ICD-10-CM | POA: Diagnosis not present

## 2020-11-10 DIAGNOSIS — T380X5A Adverse effect of glucocorticoids and synthetic analogues, initial encounter: Secondary | ICD-10-CM | POA: Diagnosis not present

## 2020-11-10 DIAGNOSIS — L89816 Pressure-induced deep tissue damage of head: Secondary | ICD-10-CM | POA: Diagnosis not present

## 2020-11-10 DIAGNOSIS — M545 Low back pain, unspecified: Secondary | ICD-10-CM | POA: Diagnosis present

## 2020-11-10 DIAGNOSIS — Z79890 Hormone replacement therapy (postmenopausal): Secondary | ICD-10-CM

## 2020-11-10 DIAGNOSIS — U071 COVID-19: Principal | ICD-10-CM | POA: Diagnosis present

## 2020-11-10 DIAGNOSIS — Z95810 Presence of automatic (implantable) cardiac defibrillator: Secondary | ICD-10-CM

## 2020-11-10 DIAGNOSIS — R739 Hyperglycemia, unspecified: Secondary | ICD-10-CM

## 2020-11-10 DIAGNOSIS — Z8249 Family history of ischemic heart disease and other diseases of the circulatory system: Secondary | ICD-10-CM

## 2020-11-10 DIAGNOSIS — Z8674 Personal history of sudden cardiac arrest: Secondary | ICD-10-CM | POA: Diagnosis not present

## 2020-11-10 DIAGNOSIS — F4310 Post-traumatic stress disorder, unspecified: Secondary | ICD-10-CM | POA: Diagnosis present

## 2020-11-10 DIAGNOSIS — E559 Vitamin D deficiency, unspecified: Secondary | ICD-10-CM | POA: Diagnosis present

## 2020-11-10 DIAGNOSIS — E781 Pure hyperglyceridemia: Secondary | ICD-10-CM | POA: Diagnosis present

## 2020-11-10 DIAGNOSIS — Z87820 Personal history of traumatic brain injury: Secondary | ICD-10-CM

## 2020-11-10 DIAGNOSIS — F419 Anxiety disorder, unspecified: Secondary | ICD-10-CM | POA: Diagnosis present

## 2020-11-10 LAB — HIV ANTIBODY (ROUTINE TESTING W REFLEX): HIV Screen 4th Generation wRfx: NONREACTIVE

## 2020-11-10 LAB — BRAIN NATRIURETIC PEPTIDE: B Natriuretic Peptide: 117.7 pg/mL — ABNORMAL HIGH (ref 0.0–100.0)

## 2020-11-10 LAB — CBC WITH DIFFERENTIAL/PLATELET
Abs Immature Granulocytes: 0.03 10*3/uL (ref 0.00–0.07)
Basophils Absolute: 0 10*3/uL (ref 0.0–0.1)
Basophils Relative: 0 %
Eosinophils Absolute: 0 10*3/uL (ref 0.0–0.5)
Eosinophils Relative: 0 %
HCT: 40 % (ref 39.0–52.0)
Hemoglobin: 13.5 g/dL (ref 13.0–17.0)
Immature Granulocytes: 1 %
Lymphocytes Relative: 14 %
Lymphs Abs: 0.5 10*3/uL — ABNORMAL LOW (ref 0.7–4.0)
MCH: 29.8 pg (ref 26.0–34.0)
MCHC: 33.8 g/dL (ref 30.0–36.0)
MCV: 88.3 fL (ref 80.0–100.0)
Monocytes Absolute: 0.1 10*3/uL (ref 0.1–1.0)
Monocytes Relative: 3 %
Neutro Abs: 3.2 10*3/uL (ref 1.7–7.7)
Neutrophils Relative %: 82 %
Platelets: 180 10*3/uL (ref 150–400)
RBC: 4.53 MIL/uL (ref 4.22–5.81)
RDW: 13.5 % (ref 11.5–15.5)
WBC: 3.9 10*3/uL — ABNORMAL LOW (ref 4.0–10.5)
nRBC: 0 % (ref 0.0–0.2)

## 2020-11-10 LAB — COMPREHENSIVE METABOLIC PANEL
ALT: 60 U/L — ABNORMAL HIGH (ref 0–44)
AST: 136 U/L — ABNORMAL HIGH (ref 15–41)
Albumin: 3.1 g/dL — ABNORMAL LOW (ref 3.5–5.0)
Alkaline Phosphatase: 124 U/L (ref 38–126)
Anion gap: 15 (ref 5–15)
BUN: 29 mg/dL — ABNORMAL HIGH (ref 8–23)
CO2: 21 mmol/L — ABNORMAL LOW (ref 22–32)
Calcium: 7.9 mg/dL — ABNORMAL LOW (ref 8.9–10.3)
Chloride: 94 mmol/L — ABNORMAL LOW (ref 98–111)
Creatinine, Ser: 1.24 mg/dL (ref 0.61–1.24)
GFR, Estimated: >60 mL/min (ref >60)
Glucose, Bld: 126 mg/dL — ABNORMAL HIGH (ref 70–99)
Potassium: 4.2 mmol/L (ref 3.5–5.1)
Sodium: 130 mmol/L — ABNORMAL LOW (ref 135–145)
Total Bilirubin: 1 mg/dL (ref 0.3–1.2)
Total Protein: 6.7 g/dL (ref 6.5–8.1)

## 2020-11-10 LAB — TRIGLYCERIDES: Triglycerides: 389 mg/dL — ABNORMAL HIGH (ref <150)

## 2020-11-10 LAB — RESPIRATORY PANEL BY RT PCR (FLU A&B, COVID)
Influenza A by PCR: NEGATIVE
Influenza B by PCR: NEGATIVE
SARS Coronavirus 2 by RT PCR: POSITIVE — AB

## 2020-11-10 LAB — MRSA PCR SCREENING: MRSA by PCR: NEGATIVE

## 2020-11-10 LAB — FIBRINOGEN: Fibrinogen: 605 mg/dL — ABNORMAL HIGH (ref 210–475)

## 2020-11-10 LAB — I-STAT VENOUS BLOOD GAS, ED
Acid-base deficit: 1 mmol/L (ref 0.0–2.0)
Bicarbonate: 24 mmol/L (ref 20.0–28.0)
Calcium, Ion: 0.95 mmol/L — ABNORMAL LOW (ref 1.15–1.40)
HCT: 39 % (ref 39.0–52.0)
Hemoglobin: 13.3 g/dL (ref 13.0–17.0)
O2 Saturation: 99 %
Potassium: 4.1 mmol/L (ref 3.5–5.1)
Sodium: 129 mmol/L — ABNORMAL LOW (ref 135–145)
TCO2: 25 mmol/L (ref 22–32)
pCO2, Ven: 41.6 mmHg — ABNORMAL LOW (ref 44.0–60.0)
pH, Ven: 7.369 (ref 7.250–7.430)
pO2, Ven: 123 mmHg — ABNORMAL HIGH (ref 32.0–45.0)

## 2020-11-10 LAB — GLUCOSE, CAPILLARY
Glucose-Capillary: 122 mg/dL — ABNORMAL HIGH (ref 70–99)
Glucose-Capillary: 133 mg/dL — ABNORMAL HIGH (ref 70–99)

## 2020-11-10 LAB — LACTIC ACID, PLASMA
Lactic Acid, Venous: 1.9 mmol/L (ref 0.5–1.9)
Lactic Acid, Venous: 1.6 mmol/L (ref 0.5–1.9)

## 2020-11-10 LAB — FERRITIN: Ferritin: 2680 ng/mL — ABNORMAL HIGH (ref 24–336)

## 2020-11-10 LAB — C-REACTIVE PROTEIN: CRP: 21.4 mg/dL — ABNORMAL HIGH (ref <1.0)

## 2020-11-10 LAB — D-DIMER, QUANTITATIVE: D-Dimer, Quant: 3.01 ug/mL-FEU — ABNORMAL HIGH (ref 0.00–0.50)

## 2020-11-10 LAB — PROCALCITONIN: Procalcitonin: 1.59 ng/mL

## 2020-11-10 LAB — LACTATE DEHYDROGENASE: LDH: 926 U/L — ABNORMAL HIGH (ref 98–192)

## 2020-11-10 MED ORDER — ONDANSETRON HCL 4 MG/2ML IJ SOLN: 4.0000 mg | Freq: Four times a day (QID) | INTRAMUSCULAR | Status: DC | PRN

## 2020-11-10 MED ORDER — ATORVASTATIN CALCIUM 80 MG PO TABS
80.0000 mg | ORAL_TABLET | Freq: Every day | ORAL | Status: DC
  Administered 2020-11-10 – 2020-11-11 (×2): 80 mg via ORAL
  Filled 2020-11-10 (×2): qty 1

## 2020-11-10 MED ORDER — FUROSEMIDE 20 MG PO TABS
20.0000 mg | ORAL_TABLET | Freq: Every day | ORAL | Status: DC
  Administered 2020-11-11: 20 mg via ORAL
  Filled 2020-11-10: qty 1

## 2020-11-10 MED ORDER — DEXAMETHASONE 6 MG PO TABS
6.0000 mg | ORAL_TABLET | ORAL | Status: DC
  Administered 2020-11-11: 6 mg via ORAL
  Filled 2020-11-10: qty 1

## 2020-11-10 MED ORDER — SODIUM CHLORIDE 0.9 % IV SOLN
100.0000 mg | Freq: Every day | INTRAVENOUS | Status: AC
  Administered 2020-11-11 – 2020-11-14 (×4): 100 mg via INTRAVENOUS
  Filled 2020-11-10 (×4): qty 20

## 2020-11-10 MED ORDER — BUPROPION HCL ER (XL) 300 MG PO TB24
300.0000 mg | ORAL_TABLET | Freq: Every day | ORAL | Status: DC
  Administered 2020-11-11 – 2020-11-16 (×2): 300 mg via ORAL
  Filled 2020-11-10 (×4): qty 1
  Filled 2020-11-10: qty 2
  Filled 2020-11-10: qty 1

## 2020-11-10 MED ORDER — IOHEXOL 350 MG/ML SOLN
80.0000 mL | Freq: Once | INTRAVENOUS | Status: AC | PRN
  Administered 2020-11-10: 80 mL via INTRAVENOUS

## 2020-11-10 MED ORDER — DEXAMETHASONE SODIUM PHOSPHATE 10 MG/ML IJ SOLN
10.0000 mg | Freq: Once | INTRAMUSCULAR | Status: AC
  Administered 2020-11-10: 10 mg via INTRAVENOUS
  Filled 2020-11-10: qty 1

## 2020-11-10 MED ORDER — CLOPIDOGREL BISULFATE 75 MG PO TABS
75.0000 mg | ORAL_TABLET | Freq: Every day | ORAL | Status: DC
  Administered 2020-11-11 – 2020-11-16 (×2): 75 mg via ORAL
  Filled 2020-11-10 (×2): qty 1

## 2020-11-10 MED ORDER — INSULIN ASPART 100 UNIT/ML ~~LOC~~ SOLN
0.0000 [IU] | Freq: Three times a day (TID) | SUBCUTANEOUS | Status: DC
  Administered 2020-11-10: 1 [IU] via SUBCUTANEOUS
  Administered 2020-11-11 – 2020-11-12 (×4): 3 [IU] via SUBCUTANEOUS
  Administered 2020-11-12 (×2): 5 [IU] via SUBCUTANEOUS
  Administered 2020-11-13 (×2): 8 [IU] via SUBCUTANEOUS

## 2020-11-10 MED ORDER — SENNA 8.6 MG PO TABS
1.0000 | ORAL_TABLET | Freq: Two times a day (BID) | ORAL | Status: DC
  Administered 2020-11-10 – 2020-11-16 (×4): 8.6 mg via ORAL
  Filled 2020-11-10 (×4): qty 1

## 2020-11-10 MED ORDER — ONDANSETRON HCL 4 MG PO TABS: 4.0000 mg | ORAL_TABLET | Freq: Four times a day (QID) | ORAL | Status: DC | PRN

## 2020-11-10 MED ORDER — ASPIRIN EC 81 MG PO TBEC
81.0000 mg | DELAYED_RELEASE_TABLET | Freq: Every day | ORAL | Status: DC
  Administered 2020-11-11 – 2020-11-16 (×2): 81 mg via ORAL
  Filled 2020-11-10 (×2): qty 1

## 2020-11-10 MED ORDER — ACETAMINOPHEN 325 MG PO TABS
650.0000 mg | ORAL_TABLET | Freq: Four times a day (QID) | ORAL | Status: DC | PRN
  Administered 2020-11-18: 650 mg via ORAL
  Filled 2020-11-10: qty 2

## 2020-11-10 MED ORDER — BARICITINIB 1 MG PO TABS
4.0000 mg | ORAL_TABLET | Freq: Every day | ORAL | Status: DC
  Administered 2020-11-11 – 2020-11-16 (×2): 4 mg via ORAL
  Filled 2020-11-10: qty 4
  Filled 2020-11-10 (×2): qty 2

## 2020-11-10 MED ORDER — TRAZODONE HCL 50 MG PO TABS
200.0000 mg | ORAL_TABLET | Freq: Every day | ORAL | Status: DC
  Administered 2020-11-10 – 2020-11-11 (×2): 200 mg via ORAL
  Filled 2020-11-10 (×2): qty 4

## 2020-11-10 MED ORDER — LEVOTHYROXINE SODIUM 100 MCG PO TABS
200.0000 ug | ORAL_TABLET | Freq: Every day | ORAL | Status: DC
  Administered 2020-11-11: 200 ug via ORAL
  Filled 2020-11-10: qty 2

## 2020-11-10 MED ORDER — SODIUM CHLORIDE 0.9 % IV SOLN
200.0000 mg | Freq: Once | INTRAVENOUS | Status: AC
  Administered 2020-11-10: 200 mg via INTRAVENOUS
  Filled 2020-11-10: qty 40

## 2020-11-10 MED ORDER — PANTOPRAZOLE SODIUM 40 MG PO TBEC
40.0000 mg | DELAYED_RELEASE_TABLET | Freq: Every day | ORAL | Status: DC
  Administered 2020-11-11: 40 mg via ORAL
  Filled 2020-11-10: qty 1

## 2020-11-10 MED ORDER — MIRTAZAPINE 15 MG PO TABS: 15.0000 mg | ORAL_TABLET | Freq: Every day | ORAL | Status: DC

## 2020-11-10 MED ORDER — DULOXETINE HCL 60 MG PO CPEP
60.0000 mg | ORAL_CAPSULE | Freq: Every day | ORAL | Status: DC
  Administered 2020-11-11 – 2020-11-16 (×2): 60 mg via ORAL
  Filled 2020-11-10 (×3): qty 1

## 2020-11-10 MED ORDER — METOPROLOL SUCCINATE ER 50 MG PO TB24
50.0000 mg | ORAL_TABLET | Freq: Every evening | ORAL | Status: DC
  Administered 2020-11-10 – 2020-11-11 (×2): 50 mg via ORAL
  Filled 2020-11-10 (×2): qty 1

## 2020-11-10 MED ORDER — ENOXAPARIN SODIUM 40 MG/0.4ML ~~LOC~~ SOLN
40.0000 mg | Freq: Every day | SUBCUTANEOUS | Status: DC
  Administered 2020-11-11: 40 mg via SUBCUTANEOUS
  Filled 2020-11-10: qty 0.4

## 2020-11-10 MED ORDER — LACTATED RINGERS IV SOLN: INTRAVENOUS | Status: DC

## 2020-11-10 NOTE — ED Notes (Signed)
Pt awake and alert at this time. Pt provided a cup of water.

## 2020-11-10 NOTE — ED Notes (Signed)
Update given to patient's wife per patient's request.

## 2020-11-10 NOTE — Progress Notes (Signed)
Patient admitted to 5W from ED. Patient is alert and oriented x4. Vital signs are stable and he is on 15L HFNC. Has no complaints of pain. Skin is intact, no signs of skin breakdown noted on exam. Patient does has permanent facial drooping on the right side of the face and also has a L AKA. Patient only has clothing and home medications that came up to the unit with him. The patient was shown how to use the call bell. Call bell, phone and bedside table are within reach; bed is in the lowest position.

## 2020-11-10 NOTE — H&P (Addendum)
Date: 11/01/2020               Patient Name:  Michael Singleton MRN: [id number]  DOB: 09-Aug-1956 Age / Sex: 64 y.o., male   PCP: [facility]         Medical Service: Internal Medicine Teaching Service         Attending Physician: Dr. Blanchie Dessert, MD    First Contact: Dr. Cato Mulligan Pager: [phone]  Second Contact: Dr. Darrick Meigs Pager: [phone]       After Hours (After 5p/  First Contact Pager: [phone]  weekends / holidays): Second Contact Pager: [phone]   Chief Complaint: Fatigue  History of Present Illness:  Mr.Mccowan is a 64 yo M w/ PMH of PTSD, CVA, Hypothyroidism, CAD, NSTEMI, VTach s/p ICD, HTN, HLD presenting to [facility] via ambulance with fatigue He was noted to be intermittently agitated and unable to answer questions due to feeling dyspneic. He states he was in his usual state of health until about a week and a half ago when he developed worsening light-headedness. He mentions multiple episodes of feeling faint over the course of the week especially with exertion. He tried to treat his symptoms at home without improvement. Also mentions having subjective dyspnea although he does not use oxygen at home. He denies any cough, productive sputum, chest congestion or sore throat. He mentions that his wife also had similar symptoms recently and had tested positive for COVID 1 week prior. He states he did not get the COVID vaccine as he is fearful of what's in the vaccines.'  Meds: Current Meds  Medication Sig  . ascorbic acid (VITAMIN C) 500 MG tablet TAKE ONE TABLET BY MOUTH DAILY -  PLEASE TAKE WITH YOUR IRON SUPPLEMENT, IT WILL HELP IT WORKED BETTER,  VITAMIN C HELPS HER IMMUNE SYSTEM TOO. -  PLEASE TAKE WITH YOUR IRON SUPPLEMENT, IT WILL HELP IT WORKED BETTER,   VITAMIN C HELPS HER IMMUNE SYSTEM TOO.  Marland Kitchen aspirin 81 MG tablet Take 1 tablet (81 mg total) by mouth daily.  Marland Kitchen atorvastatin (LIPITOR) 80 MG tablet Take 1 tablet (80 mg total) by mouth daily. (Patient  taking differently: Take 80 mg by mouth at bedtime. )  . buPROPion (WELLBUTRIN XL) 300 MG 24 hr tablet Take 300 mg by mouth daily.  . cholecalciferol 2000 UNITS TABS Take 2,000 Units by mouth daily.  . clopidogrel (PLAVIX) 75 MG tablet Take 1 tablet (75 mg total) by mouth daily with breakfast.  . cyclobenzaprine (FLEXERIL) 10 MG tablet Take 1 tablet by mouth at bedtime.  . docusate sodium (COLACE) 100 MG capsule Take 100 mg by mouth daily as needed for mild constipation.  . DULoxetine (CYMBALTA) 60 MG capsule Take 60 mg by mouth daily.  . ferrous sulfate 325 (65 FE) MG tablet TAKE ONE TABLET BY MOUTH TUESDAY, THURSDAY AND SATURDAY FOR LOW IRON, TAKE WITH VITAMIN C 500 MG (ASCORBIC ACID)  . furosemide (LASIX) 20 MG tablet Take 20 mg by mouth daily.  Marland Kitchen levothyroxine (SYNTHROID, LEVOTHROID) 25 MCG tablet Take 25 mcg by mouth daily before breakfast.   . metoprolol succinate (TOPROL-XL) 100 MG 24 hr tablet Take 50 mg by mouth every evening. Take with or immediately following a meal.  . mirtazapine (REMERON) 15 MG tablet Take by mouth.  . Multiple Vitamin (MULTIVITAMIN WITH MINERALS) TABS tablet Take 1 tablet by mouth daily.  . polyethylene glycol powder (GLYCOLAX/MIRALAX) 17 GM/SCOOP powder Take by mouth.  . potassium chloride (MICRO-K) 10  MEQ CR capsule Take by mouth.  . senna (SENOKOT) 8.6 MG tablet Take by mouth.  . traZODone (DESYREL) 100 MG tablet Take 200 mg by mouth at bedtime.  . vitamin E 180 MG (400 UNITS) capsule TAKE 1 CAPSULE BY MOUTH TUESDAY, THURSDAY AND SATURDAY   Allergies: Allergies as of 11/04/2020 - Review Complete 10/24/2020  Allergen Reaction Noted  . Fish allergy Nausea And Vomiting 08/04/2013  . Shellfish allergy Nausea And Vomiting 08/04/2013  . Tape Other (See Comments) 05/14/2016  . Morphine Other (See Comments) 07/09/2017   Past Medical History:  Diagnosis Date  . Allergic rhinitis, cause unspecified   . Anginal pain ([place])   . Anxiety   . Arthritis    "hands,  neck, back, hips, knees" (05/17/2016)  . CAD (coronary artery disease)    a. history of sudden cardiac arrest, s/p MI's in 1995 and 1997 with prior stenting;  b. 07/2013 Cath/PCI: LM nl, LAD 50p/m, D1 nl, LCX nl, RI nl, RCA dominant, 99ost (3.0x20 Promus Premier DES)/30m(3.0x12 Promus Premier DES), EF 55% w/ inf HK.c. 04/2016 3.0 x 12 mm Promus Premier DES x 1 mid LAD   . Carotid artery disease ([place])    a. Dopplers 01/2012: 60-79% bilat carotid dz;  b. 07/2013 60-79% RICA stenosis & 16-01% LICA stenosis.  . Chronic lower back pain   . Closed TBI (traumatic brain injury) ([place]) 1974   "memory issues since" (08/04/2013)  . Depression   . 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 s/p radical neck dissection. b. followed once a year - last check 09/2012 was OK.  . Diverticulosis of colon (without mention of hemorrhage)   . Falls frequently    "here lately" (05/17/2016)  . GERD (gastroesophageal reflux disease)   . Heart murmur   . Hyperlipemia   . Lumbago   . Lumbar disc disease    a. with chronic LBP  . Male infertility, unspecified   . Migraines    "monthly at least" (05/17/2016)  . Myocardial infarction ([place]) ~ 1995  . NHL (non-Hodgkin's lymphoma) ([place]) 10/09/2013  . OSA (obstructive sleep apnea)    a. not using CPAP (05/17/2016)  . Posttraumatic stress disorder   . Unspecified essential hypertension   . Unspecified hypothyroidism   . Unspecified vitamin D deficiency 10/12/2013  . Ventricular bigeminy    a. pseudobradycardia 07/2013 - bigeminy tends to register low on HR monitors.   Family History:   Both mother and father had heart attacks in their 63s.  Social History: Veteran. Currently on disability. Lives with wife. Ambulates with scooter. Denies any alcohol, tobacco, illicit substance use.  Review of Systems: A complete ROS was negative except as per HPI.   Physical Exam: Blood pressure (!) 159/74, pulse 71, temperature 98 F (36.7 C), temperature source Oral, resp. rate (!)  23, height 6' (1.829 m), weight 111.9 kg, SpO2 92 %.  Gen: Well-developed, chronically ill-appearing, agitated HEENT: NCAT head, hearing intact, EOMI, PERRL, No nasal discharge, MMM Neck: supple, ROM intact, no JVD, no cervical adenopathy CV: RRR, S1, S2 normal, No rubs, no murmurs, no gallops Pulm: Bilateral crackles up to mid thorax,  Abd: Soft, BS+, NTND, No rebound, no guarding Extm: Stable L AKA, Peripheral pulses intact, No peripheral edema Skin: Dry, Warm Neuro: AAOx3, R sided facial droop  EKG: personally reviewed my interpretation is normal sinus, no ischemic changes, similar to prior EKG  CXR: personally reviewed my interpretation is poor inspiratory effort, pacemaker visualized, increased vascular congestion,  no lobar opacity, no pleural effusion  Assessment & Plan by Problem: Active Problems:   * No active hospital problems. *  Mr.Demeritt is a 64 yo M w/ PMH of PTSD, CVA, Hypothyroidism, CAD, PAD s/p L AKA, NSTEMI, VTach s/p Ablation & pacemaker, HTN, HLD admit for acute hypoxic respiratory failure COVID pneumonia  Acute hypoxic respiratory failure COVID pneumonia Present w/ acute hypoxic respiratory failure, currently requiring 15L HFNC oxygen. Inflammatory markers elevated at CRP 21.4, D-dimer 3.01. + sick contact. Unvaccinated status. COVID +. Need admission for treatment for COVID pneumonia. Has multiple comorbidities at elevated risk for decompensation. Started on dexamethasone and remdesivir in ED. With significant oxygen requirement and CRP >20, will also start baricitinib. Pro-calcitonin elevated at 1.59 but with no left shift and lack of productive sputum or lobar opacity on CT chest, will not start antibiotics for pneumonia coverage at this time. - Admit to progressive - Start baricitinib 4mg  daily - C/w remdesivir (day 1/5) - C/w dexamethasone (day 1/10) - Trend CMP, inflammatory markers - O2 as need to keep >88 - Early ambulation, proning as tolerated, incentive  spirometry - SSI while on steroids   CAD PVD NSTEMI Diastolic heart failure Currently not endorsing chest pain. EKG without ischemic changes. On DAPT with aspirin, clopidogrel. Echo from 2019 with EF of 50-55%. Currently appears euvolemic on exam. - C/w home meds: aspirin, clopidogrel, atorvastatin  Hx of V.tach s/p ablation + pacemaker Currently in sinus. Denies palpitations. HR 71. - Telemetry - C/w home meds: metoprolol succinate 50mg  qhs  PTSD Insomnia On trazodone, ramelteon, duloxetine, bupropion at home - C/w home meds  Hypothyroidism Differing doses on chart review but most recent dosage appear to be 237mcg.  - C/w home med: levothryoxine 287mcg  DVT prophx: lovenox Diet: CM/HH Bowel: Senokot Code: Full  Prior to Admission Living Arrangement: Home Anticipated Discharge Location: Home Barriers to Discharge: Medical tx  Dispo: Admit patient to Inpatient with expected length of stay greater than 2 midnights.  Signed: Mosetta Anis, MD 10/27/2020, 11:33 AM  Pager: [phone]

## 2020-11-10 NOTE — ED Notes (Signed)
Attempted to call report; RN unable to take report at this time.

## 2020-11-10 NOTE — ED Notes (Signed)
Patient transported to CT 

## 2020-11-10 NOTE — ED Triage Notes (Signed)
Pt BIBA from home for increased weakness, sore throat, and SOB. Pt's wife dx with COVID one week ago. Pt not vaccinated. Pt is lethargic and opens eyes to verbal stimuli. Resp even and labored. Upon EMS arrival, patinet's initial O2 saturation was 62% on RA. Pt 88% on 15L NRB in triage. Pt denies respiratory history.

## 2020-11-10 NOTE — TOC Initial Note (Signed)
Transition of Care [facility]) - Initial/Assessment Note    Patient Details  Name: Michael Singleton MRN: [id number] Date of Birth: [birth year]  Transition of [facility] CM/SW Contact:    Verdell Carmine, RN Phone Number: 11/13/2020, 3:14 PM  Clinical Narrative:                 Admitted for COVID, symptoms for a few days.  Positive COVID his wife had similar symptoms as well.  Has had several days of Upper respiratory symptoms.   Patient is a [facility] [place]. If he needs Oxygen they need 48 hours notice prior to discharge.  CM will continue to follow for needs.  Expected Discharge Plan: [place] Barriers to Discharge: Continued Medical Work up   Patient Goals and CMS Choice        Expected Discharge Plan and Services Expected Discharge Plan: [place]   Discharge Planning Services: CM Consult   Living arrangements for the past 2 months: Single Family Home                                      Prior Living Arrangements/Services Living arrangements for the past 2 months: Single Family Home Lives with:: Spouse Patient language and need for interpreter reviewed:: Yes        Need for Family Participation in Patient Care: Yes (Comment) Care giver support system in place?: Yes (comment)   Criminal Activity/Legal Involvement Pertinent to Current Situation/Hospitalization: No - Comment as needed  Activities of Daily Living      Permission Sought/Granted                  Emotional Assessment       Orientation: : Oriented to Self, Oriented to Place, Oriented to  Time, Oriented to Situation Alcohol / Substance Use: Not Applicable Psych Involvement: No (comment)  Admission diagnosis:  Acute respiratory failure with hypoxia ([place]) [J96.01] Pneumonia due to COVID-19 virus [U07.1, J12.82] Patient Active Problem List   Diagnosis Date Noted  . Pneumonia due to COVID-19 virus 11/17/2020  . Atrial pacemaker lead displacement  03/17/2018  . Displacement of atrial pacemaker leads 03/17/2018  . Status post above knee amputation of left lower extremity 08/05/2017  . 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 Wide-complex tachycardia ([place])   . Chest pain 06/05/2016  . Systolic murmur 76/16/0737  . Vitamin D deficiency 10/12/2013  . NHL (non-Hodgkin's lymphoma) ([place]) 10/09/2013  . Dizziness 08/12/2013  . Fatigue 08/12/2013  . Unstable angina ([place]) 08/06/2013  . CAD (coronary artery disease)   . Intermediate coronary syndrome ([place]) 08/04/2013  . Ventricular bigeminy 08/04/2013  . Hypogonadism, male 06/28/2011  . LYMPHOMA, HEAD/NECK/FACE 11/01/2010  . Posttraumatic stress disorder 09/04/2010  . Inguinal hernia, left 09/04/2010  . [place] DISEASE, LUMBAR 09/04/2010  . DDD (degenerative disc disease), lumbosacral 09/04/2010  . OCCLUSION&STENOS CAROTID ART W/O MENTION INFARCT 05/31/2010  . Allergic rhinitis 12/19/2009  . ELBOW PAIN, RIGHT 03/08/2009  . KNEE PAIN, BILATERAL 03/08/2009  . POLYARTHRITIS 03/08/2009  . Low back pain 03/08/2009  . Pain in joint involving lower leg 03/08/2009  . Pain in joints 03/08/2009  . 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 Hypothyroidism 02/02/2008  . Obstructive sleep apnea 02/02/2008  . MYOCARDIAL INFARCTION, HX OF 02/02/2008  . DIVERTICULOSIS, COLON 02/02/2008  . Diverticulosis of colon 02/02/2008  . Hyperlipidemia 07/23/2007  .  Essential hypertension 07/23/2007  . CORONARY ARTERY DISEASE 07/23/2007  . Atherosclerotic heart disease of native coronary artery without angina pectoris 07/23/2007   PCP:  [facility] [place]:   [place], [place] - [place] [phone] [place] [address] Phone: [phone] Fax: [phone]     Social Determinants of Health (SDOH) Interventions    Readmission Risk Interventions No flowsheet data found.

## 2020-11-10 NOTE — ED Provider Notes (Addendum)
[facility] EMERGENCY DEPARTMENT Provider Note   CSN: [id number] Arrival date & time: 11/06/2020  0741     History No chief complaint on file.   Michael Singleton is a 64 y.o. male.  Patient is a 64 year old male with a history of non-Hodgkin's lymphoma, CAD, hypertension, hyperlipidemia arriving today with lethargy, sore throat, cough, hypoxia.  EMS reported that patient's wife tested positive for Covid 1 week ago.  Patient started having symptoms on Friday and has gradually declined.  When they arrived at the house he was satting 65% on room air.  Patient denies feeling short of breath.  He denies chest pain, abdominal pain, nausea or vomiting.  He has not taken any of his morning medications.  Patient is very sleepy and continues to fall asleep on exam and uncertain how accurate his answers are.  He denies being a smoker, does not use inhalers at home and has no known lung disease.  Patient has not received the Covid vaccine.  The history is provided by the patient.       Past Medical History:  Diagnosis Date  . Allergic rhinitis, cause unspecified   . Anginal pain ([place])   . Anxiety   . Arthritis    "hands, neck, back, hips, knees" (05/17/2016)  . CAD (coronary artery disease)    a. history of sudden cardiac arrest, s/p MI's in 1995 and 1997 with prior stenting;  b. 07/2013 Cath/PCI: LM nl, LAD 50p/m, D1 nl, LCX nl, RI nl, RCA dominant, 99ost (3.0x20 Promus Premier DES)/74m(3.0x12 Promus Premier DES), EF 55% w/ inf HK.c. 04/2016 3.0 x 12 mm Promus Premier DES x 1 mid LAD   . Carotid artery disease ([place])    a. Dopplers 01/2012: 60-79% bilat carotid dz;  b. 07/2013 60-79% RICA stenosis & 35-70% LICA stenosis.  . Chronic lower back pain   . Closed TBI (traumatic brain injury) ([place]) 1974   "memory issues since" (08/04/2013)  . Depression   . 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 s/p radical neck dissection. b. followed once a year - last check 09/2012 was OK.  .  Diverticulosis of colon (without mention of hemorrhage)   . Falls frequently    "here lately" (05/17/2016)  . GERD (gastroesophageal reflux disease)   . Heart murmur   . Hyperlipemia   . Lumbago   . Lumbar disc disease    a. with chronic LBP  . Male infertility, unspecified   . Migraines    "monthly at least" (05/17/2016)  . Myocardial infarction ([place]) ~ 1995  . NHL (non-Hodgkin's lymphoma) ([place]) 10/09/2013  . OSA (obstructive sleep apnea)    a. not using CPAP (05/17/2016)  . Posttraumatic stress disorder   . Unspecified essential hypertension   . Unspecified hypothyroidism   . Unspecified vitamin D deficiency 10/12/2013  . Ventricular bigeminy    a. pseudobradycardia 07/2013 - bigeminy tends to register low on HR monitors.    Patient Active Problem List   Diagnosis Date Noted  . 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 Displacement of atrial pacemaker leads 03/17/2018  . Status post above knee amputation of left lower extremity 08/05/2017  . 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 Wide-complex tachycardia ([place])   . Chest pain 06/05/2016  . Systolic murmur 17/79/3903  . Vitamin D deficiency 10/12/2013  . NHL (non-Hodgkin's lymphoma) ([place]) 10/09/2013  . Dizziness 08/12/2013  . Fatigue 08/12/2013  . Unstable angina ([place]) 08/06/2013  .  CAD (coronary artery disease)   . Intermediate coronary syndrome ([place]) 08/04/2013  . Ventricular bigeminy 08/04/2013  . Hypogonadism, male 06/28/2011  . LYMPHOMA, HEAD/NECK/FACE 11/01/2010  . Posttraumatic stress disorder 09/04/2010  . Inguinal hernia, left 09/04/2010  . [place] DISEASE, LUMBAR 09/04/2010  . DDD (degenerative disc disease), lumbosacral 09/04/2010  . OCCLUSION&STENOS CAROTID ART W/O MENTION INFARCT 05/31/2010  . Allergic rhinitis 12/19/2009  . ELBOW PAIN, RIGHT 03/08/2009  . KNEE PAIN, BILATERAL 03/08/2009  . POLYARTHRITIS 03/08/2009  . Low back pain 03/08/2009  . Pain in joint involving  lower leg 03/08/2009  . Pain in joints 03/08/2009  . 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 Hypothyroidism 02/02/2008  . Obstructive sleep apnea 02/02/2008  . MYOCARDIAL INFARCTION, HX OF 02/02/2008  . DIVERTICULOSIS, COLON 02/02/2008  . Diverticulosis of colon 02/02/2008  . Hyperlipidemia 07/23/2007  . Essential hypertension 07/23/2007  . CORONARY ARTERY DISEASE 07/23/2007  . Atherosclerotic heart disease of native coronary artery without angina pectoris 07/23/2007    Past Surgical History:  Procedure Laterality Date  . CARDIAC CATHETERIZATION  05/04/2016  . CARDIAC CATHETERIZATION N/A 05/17/2016   Procedure: Coronary Stent Intervention;  Surgeon: Burnell Blanks, MD;  Location: [facility] CV LAB;  Service: Cardiovascular;  Laterality: N/A;  . CARDIAC CATHETERIZATION N/A 05/17/2016   Procedure: Left Heart Cath and Coronary Angiography;  Surgeon: Burnell Blanks, MD;  Location: [facility] LAB;  Service: Cardiovascular;  Laterality: N/A;  . CARDIAC CATHETERIZATION  05/17/2016   Procedure: Intravascular Pressure Wire/FFR Study;  Surgeon: Burnell Blanks, MD;  Location: [facility] CV LAB;  Service: Cardiovascular;;  . CORONARY ANGIOGRAPHY N/A 06/17/2017   Procedure: CORONARY ANGIOGRAPHY (CATH LAB);  Surgeon: Lorretta Harp, MD;  Location: [facility] CV LAB;  Service: Cardiovascular;  Laterality: N/A;  . [place]; 08/05/2013; 05/17/2016   "1 + 2 + 1"   . DEEP NECK LYMPH NODE BIOPSY / EXCISION Right 07/2010  . FINGER AMPUTATION Left 1980's   thumb  . ICD IMPLANT N/A 06/18/2017   Procedure: ICD Implant;  Surgeon: Evans Lance, MD;  Location: [facility] CV LAB;  Service: Cardiovascular;  Laterality: N/A;  . INGUINAL HERNIA REPAIR Left 2013  . LEAD REVISION/REPAIR N/A 03/17/2018   Procedure: ICD LEAD REVISION/REPAIR;  Surgeon: Evans Lance, MD;  Location: [facility];  Service: Cardiovascular;  Laterality: N/A;  . LEFT HEART  CATHETERIZATION WITH CORONARY ANGIOGRAM N/A 08/05/2013   Procedure: LEFT HEART CATHETERIZATION WITH CORONARY ANGIOGRAM;  Surgeon: Burnell Blanks, MD;  Location[facility] CATH LAB;  Service: Cardiovascular;  Laterality: N/A;  . RADICAL NECK DISSECTION Right ~ 09/2010   "'for lymphoma;  (Dr Erik Obey)" (4/68/0321)  . SALIVARY GLAND SURGERY Right ~ 09/2010  . SHOULDER ARTHROSCOPY W/ ROTATOR CUFF REPAIR Right   . SHOULDER ARTHROSCOPY W/ ROTATOR CUFF REPAIR Left 2000's  . TONSILLECTOMY Right ~ 09/2010  . UMBILICAL HERNIA REPAIR  ?1990's       Family History  Problem Relation Age of Onset  . Heart attack Father        3 heart attacks   . Cancer Mother   . Heart disease Mother   . Alcohol abuse Other     Social History   Tobacco Use  . Smoking status: Former Smoker    Packs/day: 1.50    Years: 35.00    Pack years: 52.50    Types: Cigarettes    Quit date: 04/11/2004    Years since quitting: 16.5  .  Smokeless tobacco: Never Used  Vaping Use  . Vaping Use: Never used  Substance Use Topics  . Alcohol use: Yes    Comment: Hx abuse, quit 2005  . Drug use: Yes    Types: Cocaine    Comment: 05/17/2016 "used all kinds of drugs; stopped in 2004"    Home Medications Prior to Admission medications   Medication Sig Start Date End Date Taking? Authorizing Provider  acetaminophen (TYLENOL) 325 MG tablet Take 650 mg by mouth every 6 (six) hours as needed for moderate pain or headache.    [provider]  aspirin 81 MG tablet Take 1 tablet (81 mg total) by mouth daily. 08/06/13   Theora Gianotti, NP  atorvastatin (LIPITOR) 80 MG tablet Take 1 tablet (80 mg total) by mouth daily. Patient taking differently: Take 80 mg by mouth at bedtime.  08/06/13   Theora Gianotti, NP  buPROPion (WELLBUTRIN XL) 300 MG 24 hr tablet Take 300 mg by mouth daily.    [provider]  cholecalciferol 2000 UNITS TABS Take 2,000 Units by mouth daily. 10/12/13   Heath Lark, MD   clopidogrel (PLAVIX) 75 MG tablet Take 1 tablet (75 mg total) by mouth daily with breakfast. 08/06/13   Theora Gianotti, NP  docusate sodium (COLACE) 100 MG capsule Take 100 mg by mouth daily as needed for mild constipation.    [provider]  DULoxetine (CYMBALTA) 60 MG capsule Take 60 mg by mouth daily.    [provider]  furosemide (LASIX) 20 MG tablet Take 20 mg by mouth daily.    [provider]  hydrocortisone 2.5 % cream Apply 1 application topically daily as needed (cracked skin).    [provider]  levothyroxine (SYNTHROID, LEVOTHROID) 200 MCG tablet Take 200 mcg by mouth daily before breakfast.    [provider]  levothyroxine (SYNTHROID, LEVOTHROID) 25 MCG tablet Take 25 mcg by mouth daily before breakfast.     [provider]  metoprolol succinate (TOPROL-XL) 100 MG 24 hr tablet Take 50 mg by mouth every evening. Take with or immediately following a meal.    [provider]  Multiple Vitamin (MULTIVITAMIN WITH MINERALS) TABS tablet Take 1 tablet by mouth daily.    [provider]  nitroGLYCERIN (NITROSTAT) 0.4 MG SL tablet Place 1 tablet (0.4 mg total) under the tongue every 5 (five) minutes x 3 doses as needed for chest pain. 06/05/16   Imogene Burn, PA-C  pantoprazole (PROTONIX) 40 MG tablet Take 1 tablet (40 mg total) by mouth daily. 08/14/13   Theora Gianotti, NP  potassium chloride SA (K-DUR,KLOR-CON) 20 MEQ tablet Take 20 mEq by mouth daily.    [provider]  sotalol (BETAPACE) 80 MG tablet Take 1-1.5 tablets (80-120 mg total) by mouth See admin instructions. Take 120 mg by mouth in the morning and 80 mg at night 03/18/18   Baldwin Jamaica, PA-C  traZODone (DESYREL) 100 MG tablet Take 200 mg by mouth at bedtime.    [provider]  triamcinolone ointment (KENALOG) 0.1 % Apply 1 application topically daily as needed (keratosis).    [provider]     Allergies    Fish allergy, Shellfish allergy, Tape, and Morphine  Review of Systems   Review of Systems  All other systems reviewed and are negative.   Physical Exam Updated Vital Signs SpO2 (!) 88%   Physical Exam Vitals and nursing note reviewed.  Constitutional:  General: He is not in acute distress.    Appearance: Normal appearance. He is well-developed. He is obese.  HENT:     Head: Normocephalic and atraumatic.     Mouth/Throat:     Mouth: Mucous membranes are dry.  Eyes:     Conjunctiva/sclera: Conjunctivae normal.     Pupils: Pupils are equal, round, and reactive to light.  Cardiovascular:     Rate and Rhythm: Normal rate and regular rhythm.     Heart sounds: No murmur heard.   Pulmonary:     Effort: Pulmonary effort is normal. Tachypnea present. No respiratory distress.     Breath sounds: Examination of the right-middle field reveals decreased breath sounds. Examination of the left-middle field reveals decreased breath sounds. Examination of the right-lower field reveals decreased breath sounds. Examination of the left-lower field reveals decreased breath sounds. Decreased breath sounds present. No wheezing or rales.  Abdominal:     General: There is no distension.     Palpations: Abdomen is soft.     Tenderness: There is no abdominal tenderness. There is no guarding or rebound.  Musculoskeletal:        General: No tenderness. Normal range of motion.     Cervical back: Normal range of motion and neck supple.     Comments: Left AKA.  Right lower extremity without significant edema.  No erythema or pain noted  Skin:    General: Skin is warm and dry.     Findings: No erythema or rash.     Comments: Slightly mottled  Neurological:     Mental Status: He is alert.     Comments: Oriented to person and place.  Able to move all extremities without difficulty.  Continually falling asleep but awoken by voice  Psychiatric:     Comments: Calm and cooperative      ED Results / Procedures / Treatments   Labs (all labs ordered are listed, but only abnormal results are displayed) Labs Reviewed  RESPIRATORY PANEL BY RT PCR (FLU A&B, COVID) - Abnormal; Notable for the following components:      Result Value   SARS Coronavirus 2 by RT PCR POSITIVE (*)    All other components within normal limits  CBC WITH DIFFERENTIAL/PLATELET - Abnormal; Notable for the following components:   WBC 3.9 (*)    Lymphs Abs 0.5 (*)    All other components within normal limits  COMPREHENSIVE METABOLIC PANEL - Abnormal; Notable for the following components:   Sodium 130 (*)    Chloride 94 (*)    CO2 21 (*)    Glucose, Bld 126 (*)    BUN 29 (*)    Calcium 7.9 (*)    Albumin 3.1 (*)    AST 136 (*)    ALT 60 (*)    All other components within normal limits  D-DIMER, QUANTITATIVE (NOT AT [facility]) - Abnormal; Notable for the following components:   D-Dimer, Quant 3.01 (*)    All other components within normal limits  LACTATE DEHYDROGENASE - Abnormal; Notable for the following components:   LDH 926 (*)    All other components within normal limits  FERRITIN - Abnormal; Notable for the following components:   Ferritin 2,680 (*)    All other components within normal limits  FIBRINOGEN - Abnormal; Notable for the following components:   Fibrinogen 605 (*)    All other components within normal limits  C-REACTIVE PROTEIN - Abnormal; Notable for the following components:   CRP 21.4 (*)  All other components within normal limits  TRIGLYCERIDES - Abnormal; Notable for the following components:   Triglycerides 389 (*)    All other components within normal limits  I-STAT VENOUS BLOOD GAS, ED - Abnormal; Notable for the following components:   pCO2, Ven 41.6 (*)    pO2, Ven 123.0 (*)    Sodium 129 (*)    Calcium, Ion 0.95 (*)    All other components within normal limits  CULTURE, BLOOD (ROUTINE X 2)  CULTURE, BLOOD (ROUTINE X 2)  LACTIC ACID, PLASMA  PROCALCITONIN   LACTIC ACID, PLASMA    EKG EKG Interpretation  Date/Time:  Thursday November 10 2020 07:56:39 EST Ventricular Rate:  76 PR Interval:    QRS Duration: 110 QT Interval:  414 QTC Calculation: 466 R Axis:   56 Text Interpretation: Sinus rhythm new Incomplete left bundle branch block Confirmed by Blanchie Dessert [phone]) on 11/18/2020 8:01:22 AM   Radiology CT Angio Chest PE W and/or Wo Contrast  Result Date: 10/28/2020 CLINICAL DATA:  Shortness of breath, COVID-19 positive EXAM: CT ANGIOGRAPHY CHEST WITH CONTRAST TECHNIQUE: Multidetector CT imaging of the chest was performed using the standard protocol during bolus administration of intravenous contrast. Multiplanar CT image reconstructions and MIPs were obtained to evaluate the vascular anatomy. CONTRAST:  34mL OMNIPAQUE IOHEXOL 350 MG/ML SOLN COMPARISON:  X-ray 11/20/2020.  CT 10/20/2010 FINDINGS: Cardiovascular: Adequate contrast bolus. Evaluation of the distal pulmonary arterial tree is degraded by respiratory motion artifact. No filling defect to the lobar branch level. Pulmonary trunk is nondilated. Normal RV to LV ratio. Thoracic aorta is nonaneurysmal. Atherosclerotic calcifications of the aorta and coronary arteries. Heart size is within normal limits. No pericardial effusion. Prominent epicardial fat. Mediastinum/Nodes: No axillary, mediastinal, or hilar lymphadenopathy. Trachea and esophagus within normal limits. Lungs/Pleura: Extensive ground-glass consolidation throughout the bilateral lung fields. No pleural effusion or pneumothorax. Upper Abdomen: Diffuse hepatic steatosis. No acute findings within the visualized upper abdomen. Musculoskeletal: No chest wall abnormality. No acute or significant osseous findings. Review of the MIP images confirms the above findings. IMPRESSION: 1. No evidence of pulmonary embolism to the lobar branch level. 2. Extensive ground-glass consolidation throughout the bilateral lung fields, consistent with  multifocal pneumonia in the setting of known COVID-19 infection. 3. Hepatic steatosis. 4. Aortic and coronary artery atherosclerosis. (ICD10-I70.0). Electronically Signed   By: Davina Poke D.O.   On: 11/09/2020 10:35   DG Chest Port 1 View  Result Date: 11/01/2020 CLINICAL DATA:  Shortness of breath. EXAM: PORTABLE CHEST 1 VIEW COMPARISON:  March 18, 2018. FINDINGS: Cardiomediastinal silhouette is accentuated by low lung volumes and AP portable technique. Left subclavian approach cardiac rhythm maintenance device in similar position. Low lung volumes. No focal consolidation. No visible pleural effusions or pneumothorax. Clips at the right thoracic inlet/lower neck. IMPRESSION: Low lung volumes without evidence of acute cardiopulmonary disease. Electronically Signed   By: Margaretha Sheffield MD   On: 11/21/2020 09:04    Procedures Procedures (including critical care time)  Medications Ordered in ED Medications - No data to display  ED Course  I have reviewed the triage vital signs and the nursing notes.  Pertinent labs & imaging results that were available during my care of the patient were reviewed by me and considered in my medical decision making (see chart for details).    MDM Rules/Calculators/A&P                          Patient is  a 64 year old male presenting today with hypoxia, lethargy, cough and congestion.  This started 7 days prior to arrival.  Patient's wife did test Covid positive.  He has not been vaccinated.  He has no chest pain, abdominal pain or evidence of CHF.  Concern for Covid pneumonia and acute respiratory failure.  Covid labs are pending.  Patient initially put on nonrebreather and 5 L nasal cannula with improvement of sats to 92%.  He is drowsy but does awaken to voice.  EKG with an incomplete bundle branch block which is new from prior but overall EKG is improved from his most recent EKG.  Will place on high flow for comfort and to maintain sats.  11:14 AM CTA  without evidence of PE but signs consistent with pneumonia.  Patient remains on 15 L high flow and has been stable with sats between 90 and 93%.  He is now more awake and mentating more appropriately.  9:58 AM VBG with normal pH and no evidence of CO2 retention, patient is Covid positive, lactic acid within normal limits, CBC with mild leukopenia but otherwise normal, CMP with mild elevation of LFTs and mild hyponatremia of 130 with normal anion gap, is elevated at 3, pro calcitonin within normal limits, LDH elevated at 926, ferritin evaded 2680, fibrinogen elevated at 605, CRP elevated at 21 and triglycerides elevated at 389.  Chest x-ray without acute findings.  We will do CT to further evaluate.  Patient remained stable on 15 L high flow nasal cannula.  We will plan on admission.  Pt given remdisivir and decadron.  Elion Hocker was evaluated in Emergency Department on 11/18/2020 for the symptoms described in the history of present illness. He was evaluated in the context of the global COVID-19 pandemic, which necessitated consideration that the patient might be at risk for infection with the SARS-CoV-2 virus that causes COVID-19. Institutional protocols and algorithms that pertain to the evaluation of patients at risk for COVID-19 are in a state of rapid change based on information released by regulatory bodies including the CDC and federal and state organizations. These policies and algorithms were followed during the patient's care in the ED. MDM Number of Diagnoses or Management Options   Amount and/or Complexity of Data Reviewed Clinical lab tests: ordered and reviewed Tests in the radiology section of CPT: reviewed and ordered Tests in the medicine section of CPT: reviewed and ordered Decide to obtain previous medical records or to obtain history from someone other than the patient: yes Review and summarize past medical records: yes Independent visualization of images, tracings, or  specimens: yes  Risk of Complications, Morbidity, and/or Mortality Presenting problems: high Diagnostic procedures: moderate Management options: high  Patient Progress Patient progress: improved  CRITICAL CARE Performed by: Greely Atiyeh Total critical care time: 30 minutes Critical care time was exclusive of separately billable procedures and treating other patients. Critical care was necessary to treat or prevent imminent or life-threatening deterioration. Critical care was time spent personally by me on the following activities: development of treatment plan with patient and/or surrogate as well as nursing, discussions with consultants, evaluation of patient's response to treatment, examination of patient, obtaining history from patient or surrogate, ordering and performing treatments and interventions, ordering and review of laboratory studies, ordering and review of radiographic studies, pulse oximetry and re-evaluation of patient's condition.   Final Clinical Impression(s) / ED Diagnoses Final diagnoses:  Pneumonia due to COVID-19 virus  Acute respiratory failure with hypoxia ([place])    Rx /  DC Orders ED Discharge Orders    None       Blanchie Dessert, MD 10/27/2020 1001    Blanchie Dessert, MD 10/31/2020 1115

## 2020-11-10 NOTE — ED Notes (Signed)
Condom cath placed on patient at this time.

## 2020-11-11 DIAGNOSIS — J1282 Pneumonia due to coronavirus disease 2019: Secondary | ICD-10-CM | POA: Diagnosis not present

## 2020-11-11 DIAGNOSIS — U071 COVID-19: Secondary | ICD-10-CM | POA: Diagnosis not present

## 2020-11-11 LAB — COMPREHENSIVE METABOLIC PANEL
ALT: 54 U/L — ABNORMAL HIGH (ref 0–44)
AST: 128 U/L — ABNORMAL HIGH (ref 15–41)
Albumin: 2.9 g/dL — ABNORMAL LOW (ref 3.5–5.0)
Alkaline Phosphatase: 137 U/L — ABNORMAL HIGH (ref 38–126)
Anion gap: 12 (ref 5–15)
BUN: 22 mg/dL (ref 8–23)
CO2: 22 mmol/L (ref 22–32)
Calcium: 7.8 mg/dL — ABNORMAL LOW (ref 8.9–10.3)
Chloride: 100 mmol/L (ref 98–111)
Creatinine, Ser: 0.86 mg/dL (ref 0.61–1.24)
GFR, Estimated: >60 mL/min (ref >60)
Glucose, Bld: 166 mg/dL — ABNORMAL HIGH (ref 70–99)
Potassium: 4.2 mmol/L (ref 3.5–5.1)
Sodium: 134 mmol/L — ABNORMAL LOW (ref 135–145)
Total Bilirubin: 0.8 mg/dL (ref 0.3–1.2)
Total Protein: 6.8 g/dL (ref 6.5–8.1)

## 2020-11-11 LAB — PROCALCITONIN: Procalcitonin: 0.97 ng/mL

## 2020-11-11 LAB — CBC WITH DIFFERENTIAL/PLATELET
Abs Immature Granulocytes: 0.08 10*3/uL — ABNORMAL HIGH (ref 0.00–0.07)
Basophils Absolute: 0 10*3/uL (ref 0.0–0.1)
Basophils Relative: 0 %
Eosinophils Absolute: 0 10*3/uL (ref 0.0–0.5)
Eosinophils Relative: 0 %
HCT: 40.3 % (ref 39.0–52.0)
Hemoglobin: 13.8 g/dL (ref 13.0–17.0)
Immature Granulocytes: 1 %
Lymphocytes Relative: 11 %
Lymphs Abs: 0.7 10*3/uL (ref 0.7–4.0)
MCH: 29.6 pg (ref 26.0–34.0)
MCHC: 34.2 g/dL (ref 30.0–36.0)
MCV: 86.5 fL (ref 80.0–100.0)
Monocytes Absolute: 0.2 10*3/uL (ref 0.1–1.0)
Monocytes Relative: 4 %
Neutro Abs: 4.8 10*3/uL (ref 1.7–7.7)
Neutrophils Relative %: 84 %
Platelets: 202 10*3/uL (ref 150–400)
RBC: 4.66 MIL/uL (ref 4.22–5.81)
RDW: 13.5 % (ref 11.5–15.5)
WBC: 5.7 10*3/uL (ref 4.0–10.5)
nRBC: 0 % (ref 0.0–0.2)

## 2020-11-11 LAB — GLUCOSE, CAPILLARY
Glucose-Capillary: 189 mg/dL — ABNORMAL HIGH (ref 70–99)
Glucose-Capillary: 191 mg/dL — ABNORMAL HIGH (ref 70–99)
Glucose-Capillary: 165 mg/dL — ABNORMAL HIGH (ref 70–99)
Glucose-Capillary: 169 mg/dL — ABNORMAL HIGH (ref 70–99)

## 2020-11-11 LAB — MAGNESIUM: Magnesium: 2.9 mg/dL — ABNORMAL HIGH (ref 1.7–2.4)

## 2020-11-11 LAB — C-REACTIVE PROTEIN: CRP: 18.3 mg/dL — ABNORMAL HIGH (ref <1.0)

## 2020-11-11 LAB — D-DIMER, QUANTITATIVE: D-Dimer, Quant: 2.85 ug/mL-FEU — ABNORMAL HIGH (ref 0.00–0.50)

## 2020-11-11 LAB — FERRITIN: Ferritin: 3758 ng/mL — ABNORMAL HIGH (ref 24–336)

## 2020-11-11 NOTE — Progress Notes (Signed)
Pt's SATs 84 during RT rounds. RT coached pt on slow and deep breathing with a 2-3 second pause before exhale to help recruit lower lung. SATs temporarily increased to 100% and now sustaining 92%. RT will continue to monitor.

## 2020-11-11 NOTE — Progress Notes (Signed)
   11/11/20 0737  Assess: MEWS Score  Temp (!) 96.7 F (35.9 C)  BP (!) 140/54  Pulse Rate 72  ECG Heart Rate 72  Resp (!) 23  SpO2 92 %  O2 Device HFNC;Non-rebreather Mask  Patient Activity (if Appropriate) In bed  O2 Flow Rate (L/min) 30 L/min (15L NRB)  FiO2 (%) 100 %  Assess: MEWS Score  MEWS Temp 1  MEWS Systolic 0  MEWS Pulse 0  MEWS RR 1  MEWS LOC 0  MEWS Score 2  MEWS Score Color Yellow  Assess: if the MEWS score is Yellow or Red  Were vital signs taken at a resting state? Yes  Focused Assessment No change from prior assessment  Early Detection of Sepsis Score *See Row Information* Low  MEWS guidelines implemented *See Row Information* Yes  Treat  MEWS Interventions Administered scheduled meds/treatments  Pain Scale 0-10  Pain Score 0  Take Vital Signs  Increase Vital Sign Frequency  Yellow: Q 2hr X 2 then Q 4hr X 2, if remains yellow, continue Q 4hrs  Escalate  MEWS: Escalate Yellow: discuss with charge nurse/RN and consider discussing with provider and RRT  Notify: Charge Nurse/RN  Name of Charge Nurse/RN Notified Charlynne Cousins  Date Charge Nurse/RN Notified 11/11/20  Time Charge Nurse/RN Notified 0740  Notify: Provider  Provider Name/Title Dr. Rebeca Alert  Date Provider Notified 11/11/20  Time Provider Notified [phone]  Notification Type Call  Notification Reason Other (Comment) (MEWS score yellow, per protocol)  Response No new orders  Date of Provider Response 11/11/20  Time of Provider Response 0800

## 2020-11-11 NOTE — Progress Notes (Signed)
Patient's 02 saturation was low 85 with HFNC 15L and nonrebreathing. Notified on call MD and respiratory. Started 02 with HHFNC 30l and fi02 100% with nonrebreathing. will continue monitor.

## 2020-11-11 NOTE — Progress Notes (Signed)
Subjective:   Overnight, patient desaturated to the mid 80s while on 15L of HFNC and nonrebreather. Patient was transitioned to [facility] 30L and FiO2 100% with nonrebreather.  This morning, patient desaturated to mid 80s while on 30L HHFNC at 100% with nonrebreather and increased to 40L. On our evaluation, patient denies shortness of breath, cough, or chest pain. He has no new or worsening complaints. He has no further questions or concerns.  Objective:  Vital signs in last 24 hours: Vitals:   11/11/20 0737 11/11/20 0800 11/11/20 0912 11/11/20 0936  BP: (!) 140/54   138/61  Pulse: 72  64 65  Resp: (!) 23  (!) 22 18  Temp: (!) 96.7 F (35.9 C)   (!) 97.3 F (36.3 C)  TempSrc: Axillary   Axillary  SpO2: 92% (!) 89% 94% 95%  Weight:      Height:      SpO2: 95 % O2 Flow Rate (L/min): 40 L/min FiO2 (%): 100 %  Intake/Output Summary (Last 24 hours) at 11/11/2020 1054 Last data filed at 11/11/2020 0924 Gross per 24 hour  Intake 986.25 ml  Output 1075 ml  Net -88.75 ml   Filed Weights   10/24/2020 0853  Weight: 111.9 kg  Physical Exam Vitals and nursing note reviewed. Exam conducted with a chaperone present.  Constitutional:      General: He is not in acute distress.    Appearance: He is obese. He is ill-appearing.  HENT:     Head: Normocephalic and atraumatic.  Eyes:     Extraocular Movements: Extraocular movements intact.     Conjunctiva/sclera: Conjunctivae normal.  Cardiovascular:     Rate and Rhythm: Normal rate and regular rhythm.     Pulses: Normal pulses.     Heart sounds: Normal heart sounds.  Pulmonary:     Effort: Pulmonary effort is normal.     Breath sounds: Rales present.  Abdominal:     General: Abdomen is flat. Bowel sounds are normal.     Palpations: Abdomen is soft.     Tenderness: There is no abdominal tenderness.  Musculoskeletal:        General: Normal range of motion.     Cervical back: Normal range of motion and neck supple.     Right lower  leg: No edema.     Comments: Left AKA  Skin:    General: Skin is warm and dry.     Capillary Refill: Capillary refill takes less than 2 seconds.  Neurological:     General: No focal deficit present.     Mental Status: He is alert. Mental status is at baseline.  Psychiatric:        Mood and Affect: Mood normal.        Behavior: Behavior normal.        Thought Content: Thought content normal.        Judgment: Judgment normal.    CBC Latest Ref Rng & Units 11/11/2020 10/24/2020 10/30/2020  WBC 4.0 - 10.5 K/uL 5.7 - 3.9(L)  Hemoglobin 13.0 - 17.0 g/dL 13.8 13.3 13.5  Hematocrit 39 - 52 % 40.3 39.0 40.0  Platelets 150 - 400 K/uL 202 - 180   CMP Latest Ref Rng & Units 11/11/2020 11/22/2020 10/25/2020  Glucose 70 - 99 mg/dL 166(H) - 126(H)  BUN 8 - 23 mg/dL 22 - 29(H)  Creatinine 0.61 - 1.24 mg/dL 0.86 - 1.24  Sodium 135 - 145 mmol/L 134(L) 129(L) 130(L)  Potassium 3.5 - 5.1 mmol/L 4.2  4.1 4.2  Chloride 98 - 111 mmol/L 100 - 94(L)  CO2 22 - 32 mmol/L 22 - 21(L)  Calcium 8.9 - 10.3 mg/dL 7.8(L) - 7.9(L)  Total Protein 6.5 - 8.1 g/dL 6.8 - 6.7  Total Bilirubin 0.3 - 1.2 mg/dL 0.8 - 1.0  Alkaline Phos 38 - 126 U/L 137(H) - 124  AST 15 - 41 U/L 128(H) - 136(H)  ALT 0 - 44 U/L 54(H) - 60(H)   Procalcitonin - 0.97 down from 1.59 Mg - 2.9 Ferritin - 3,758 up from 2,680 D-dimer - 2.85 down from 3.01 CRP - 18.3 down from 21.4 Glucose - 189, 133, 122  Assessment/Plan:  Principal Problem:   Pneumonia due to COVID-19 virus Active Problems:   Hypothyroidism  [name] is a 64 year old male with past medical history of PTSD, CVA, Hypothyroidism, CAD, PAD s/p L AKA, NSTEMI, VTach s/p Ablation & pacemaker, HTN, HLD who presented to [facility] on 11/18 for evaluation of shortness of breath and hypoxia found to have acute hypoxic respiratory failure secondary to COVID pneumonia.  #Acute hypoxic respiratory failure 2/2 COVID pneumonia, active Patient's acute hypoxic respiratory failure  worsening with need to escalate patient's oxygen requirements from 15L HFNC to 40L HHFNC and nonrebreather since admission. Patient appears comfortable on current oxygen regimen and denies any shortness of breath or cough. Patient's respiratory status will need to be monitored closely with escalation of oxygen as needed and consideration to consult PCCM if continues to worsen. Patient's CRP, D-dimer and procalcitonin downtrending this morning with increase in ferritin. -Continue baricitinib 4mg  daily (day 2/5) -Continue remdesivir (day 2/5) -Continue dexamethasone (day 2/10) -Trend CMP, inflammatory markers -O2 as need to keep >88  -Currently 40L HHFNC + NRB -Early ambulation, proning as tolerated, incentive spirometry -SSI while on steroids  -Novolog 0-15 units three times daily with meals   #CAD #PVD #NSTEMI #Diastolic heart failure Currently denies chest pain and appears euvolemic on examination. -Continue home aspirin 81mg  daily -Continue home clopidogrel 75mg  daily -Continue home atorvastatin 80mg  daily -Continue home furosemide 20mg  daily  #Ventricular tachycardia s/p ablation + pacemaker, chronic Patient in normal sinus rhythm. -Continue telemetry -Continue metoprolol succinate 50mg  qhs  #PTSD #Insomnia -Continue home trazodone 200mg  daily -Continue home duloxetine 60mg  daily -Continue home bupropion 300mg  daily  #Hypothyroidism, chronic -Continue home levothryoxine 273mcg  #VTE ppx: Lovenox 40mg  daily #Diet: Carb modified/heart healthy #Bowel regimen: Senna twice daily #Code status: Full code  Cato Mulligan, MD 11/11/2020, 10:54 AM Pager: [phone] After 5pm on weekdays and 1pm on weekends: On Call pager [phone]

## 2020-11-11 NOTE — Significant Event (Signed)
Rapid Response Note   Reason for Call :  Pt requiring 40L/100% HHFNC and 100% NRB, rapid response requested to round on patient  Initial Focused Assessment:  Pt sitting up in bed. Breathing comfortably and in no distress. Lung sounds are clear, diminished. He is able to follow commands and is AOx4. Complexion is pale, skin is warm/dry. He denies pain. He states his breathing is good and that he feels about the same, if not better than yesterday.   VS: T 97.80F, BP 120/66, HR 67, RR 26, SpO2 92% on 40L/100% HHFNC and 100% NRB CBG: 191  Interventions:  No intervention from [place] of Care:  -Encourage proning -Encourage pulmonary hygiene -Oral care per protocol -Notify if pt has increased work of breathing or AMS  Call rapid response for additional needs  Event Summary:  Arrival Time: 1200 End Time: [place], RN

## 2020-11-12 ENCOUNTER — Inpatient Hospital Stay (HOSPITAL_COMMUNITY): Payer: No Typology Code available for payment source

## 2020-11-12 DIAGNOSIS — J1282 Pneumonia due to coronavirus disease 2019: Secondary | ICD-10-CM

## 2020-11-12 DIAGNOSIS — U071 COVID-19: Secondary | ICD-10-CM

## 2020-11-12 LAB — COMPREHENSIVE METABOLIC PANEL
ALT: 51 U/L — ABNORMAL HIGH (ref 0–44)
AST: 135 U/L — ABNORMAL HIGH (ref 15–41)
Albumin: 3.1 g/dL — ABNORMAL LOW (ref 3.5–5.0)
Alkaline Phosphatase: 132 U/L — ABNORMAL HIGH (ref 38–126)
Anion gap: 14 (ref 5–15)
BUN: 29 mg/dL — ABNORMAL HIGH (ref 8–23)
CO2: 24 mmol/L (ref 22–32)
Calcium: 8.4 mg/dL — ABNORMAL LOW (ref 8.9–10.3)
Chloride: 97 mmol/L — ABNORMAL LOW (ref 98–111)
Creatinine, Ser: 0.95 mg/dL (ref 0.61–1.24)
GFR, Estimated: >60 mL/min (ref >60)
Glucose, Bld: 156 mg/dL — ABNORMAL HIGH (ref 70–99)
Potassium: 4.4 mmol/L (ref 3.5–5.1)
Sodium: 135 mmol/L (ref 135–145)
Total Bilirubin: 0.7 mg/dL (ref 0.3–1.2)
Total Protein: 6.9 g/dL (ref 6.5–8.1)

## 2020-11-12 LAB — D-DIMER, QUANTITATIVE: D-Dimer, Quant: 2.48 ug/mL-FEU — ABNORMAL HIGH (ref 0.00–0.50)

## 2020-11-12 LAB — BLOOD GAS, ARTERIAL
Acid-Base Excess: 1.8 mmol/L (ref 0.0–2.0)
Bicarbonate: 25.7 mmol/L (ref 20.0–28.0)
Drawn by: 252031
FIO2: 100
O2 Saturation: 75.9 %
Patient temperature: 37
pCO2 arterial: 38.5 mmHg (ref 32.0–48.0)
pH, Arterial: 7.439 (ref 7.350–7.450)
pO2, Arterial: 43.5 mmHg — ABNORMAL LOW (ref 83.0–108.0)

## 2020-11-12 LAB — CBC WITH DIFFERENTIAL/PLATELET
Abs Immature Granulocytes: 0.33 10*3/uL — ABNORMAL HIGH (ref 0.00–0.07)
Basophils Absolute: 0 10*3/uL (ref 0.0–0.1)
Basophils Relative: 0 %
Eosinophils Absolute: 0 10*3/uL (ref 0.0–0.5)
Eosinophils Relative: 0 %
HCT: 43.1 % (ref 39.0–52.0)
Hemoglobin: 14.4 g/dL (ref 13.0–17.0)
Immature Granulocytes: 4 %
Lymphocytes Relative: 10 %
Lymphs Abs: 0.9 10*3/uL (ref 0.7–4.0)
MCH: 29.3 pg (ref 26.0–34.0)
MCHC: 33.4 g/dL (ref 30.0–36.0)
MCV: 87.6 fL (ref 80.0–100.0)
Monocytes Absolute: 0.5 10*3/uL (ref 0.1–1.0)
Monocytes Relative: 5 %
Neutro Abs: 7.4 10*3/uL (ref 1.7–7.7)
Neutrophils Relative %: 81 %
Platelets: 235 10*3/uL (ref 150–400)
RBC: 4.92 MIL/uL (ref 4.22–5.81)
RDW: 13.7 % (ref 11.5–15.5)
WBC: 9.2 10*3/uL (ref 4.0–10.5)
nRBC: 0.2 % (ref 0.0–0.2)

## 2020-11-12 LAB — GLUCOSE, CAPILLARY
Glucose-Capillary: 147 mg/dL — ABNORMAL HIGH (ref 70–99)
Glucose-Capillary: 153 mg/dL — ABNORMAL HIGH (ref 70–99)
Glucose-Capillary: 166 mg/dL — ABNORMAL HIGH (ref 70–99)
Glucose-Capillary: 204 mg/dL — ABNORMAL HIGH (ref 70–99)
Glucose-Capillary: 232 mg/dL — ABNORMAL HIGH (ref 70–99)
Glucose-Capillary: 244 mg/dL — ABNORMAL HIGH (ref 70–99)

## 2020-11-12 LAB — C-REACTIVE PROTEIN: CRP: 9.6 mg/dL — ABNORMAL HIGH (ref <1.0)

## 2020-11-12 LAB — MAGNESIUM: Magnesium: 3 mg/dL — ABNORMAL HIGH (ref 1.7–2.4)

## 2020-11-12 LAB — PROCALCITONIN: Procalcitonin: 0.74 ng/mL

## 2020-11-12 LAB — FERRITIN: Ferritin: 3778 ng/mL — ABNORMAL HIGH (ref 24–336)

## 2020-11-12 MED ORDER — DEXAMETHASONE 6 MG PO TABS: 12.0000 mg | ORAL_TABLET | ORAL | Status: DC

## 2020-11-12 MED ORDER — FUROSEMIDE 10 MG/ML IJ SOLN
40.0000 mg | Freq: Once | INTRAMUSCULAR | Status: AC
  Administered 2020-11-12: 40 mg via INTRAVENOUS
  Filled 2020-11-12: qty 4

## 2020-11-12 MED ORDER — DEXMEDETOMIDINE HCL IN NACL 400 MCG/100ML IV SOLN
0.2000 ug/kg/h | INTRAVENOUS | Status: DC
  Administered 2020-11-12: 1.2 ug/kg/h via INTRAVENOUS
  Administered 2020-11-12: 0.8 ug/kg/h via INTRAVENOUS
  Administered 2020-11-12: 1 ug/kg/h via INTRAVENOUS
  Administered 2020-11-12: 0.8 ug/kg/h via INTRAVENOUS
  Administered 2020-11-12: 0.9 ug/kg/h via INTRAVENOUS
  Administered 2020-11-13: 0.5 ug/kg/h via INTRAVENOUS
  Administered 2020-11-13 (×2): 0.8 ug/kg/h via INTRAVENOUS
  Administered 2020-11-13: 0.6 ug/kg/h via INTRAVENOUS
  Administered 2020-11-13: 0.8 ug/kg/h via INTRAVENOUS
  Administered 2020-11-14: 0.5 ug/kg/h via INTRAVENOUS
  Administered 2020-11-14 (×2): 0.4 ug/kg/h via INTRAVENOUS
  Administered 2020-11-15 (×4): 0.7 ug/kg/h via INTRAVENOUS
  Administered 2020-11-16 (×3): 0.6 ug/kg/h via INTRAVENOUS
  Administered 2020-11-16: 0.7 ug/kg/h via INTRAVENOUS
  Administered 2020-11-17: 0.6 ug/kg/h via INTRAVENOUS
  Administered 2020-11-17: 0.5 ug/kg/h via INTRAVENOUS
  Filled 2020-11-12 (×25): qty 100

## 2020-11-12 MED ORDER — ORAL CARE MOUTH RINSE
15.0000 mL | Freq: Two times a day (BID) | OROMUCOSAL | Status: DC
  Administered 2020-11-12 – 2020-11-18 (×9): 15 mL via OROMUCOSAL

## 2020-11-12 MED ORDER — ENOXAPARIN SODIUM 60 MG/0.6ML ~~LOC~~ SOLN
55.0000 mg | Freq: Every day | SUBCUTANEOUS | Status: DC
  Administered 2020-11-12 – 2020-11-18 (×7): 55 mg via SUBCUTANEOUS
  Filled 2020-11-12 (×7): qty 0.6

## 2020-11-12 MED ORDER — CHLORHEXIDINE GLUCONATE CLOTH 2 % EX PADS
6.0000 | MEDICATED_PAD | Freq: Every day | CUTANEOUS | Status: DC
  Administered 2020-11-12 – 2020-11-17 (×3): 6 via TOPICAL

## 2020-11-12 MED ORDER — CHLORHEXIDINE GLUCONATE 0.12 % MT SOLN
15.0000 mL | Freq: Two times a day (BID) | OROMUCOSAL | Status: DC
  Administered 2020-11-12 – 2020-11-18 (×8): 15 mL via OROMUCOSAL
  Filled 2020-11-12 (×6): qty 15

## 2020-11-12 MED ORDER — METHYLPREDNISOLONE SODIUM SUCC 125 MG IJ SOLR
60.0000 mg | Freq: Two times a day (BID) | INTRAMUSCULAR | Status: DC
  Administered 2020-11-12 – 2020-11-16 (×8): 60 mg via INTRAVENOUS
  Filled 2020-11-12 (×9): qty 2

## 2020-11-12 MED ORDER — PANTOPRAZOLE SODIUM 40 MG IV SOLR
40.0000 mg | INTRAVENOUS | Status: DC
  Administered 2020-11-12 – 2020-11-19 (×8): 40 mg via INTRAVENOUS
  Filled 2020-11-12 (×8): qty 40

## 2020-11-12 NOTE — Significant Event (Signed)
Rapid Response Event Note   Reason for Call :  SpO2-70s on 50L HHFNC 100% and NRB  Initial Focused Assessment:  Pt laying in bed with eyes closed. He will awaken easily and answer orientation questions appropriately, yet he will intermittently try to take off his mittens. He is tachypneic but breathing isn't labored. Lungs are clear and diminished. Skin is pale, warm and dry. Pt says his breathing is the same as it was at the beginning of the shift.  T-97.7, HR-74, BP-1479/63, RR-29, SpO2-79-82% on 50L HHFNC 100% and NRB.   Flap placed back on NRB with no change in SpO2. [place] increased to 55L [place] with SpO2 increasing to 82-85%.  0140-Pt more confused, using abdominal muscles to breath, and skin is getting mottled. [place] notified. Order to move pt to 68M per Valetta Fuller, NP. Interventions:  ABG-7.43/38.5/43.5/25.7 PCXR PCCM consulted Pt txed to 68M13 Plan of Care:  Tx to 68M13.   Event Summary:   MD Notified: DR. Chrissie Noa notified and came to bedside  Call [place], Fabienne Nolasco Anderson, RN

## 2020-11-12 NOTE — Progress Notes (Signed)
Pt taken off bipap and placed on HHFNC and NRB at this time per MD request. Pt very agitated, RN at bedside. Pt eventually settled down. Spo2 78-82%, No distress noted, no increased WOB. MD made aware. Spo2 goal >75% but not for long and then place back on bipap.

## 2020-11-12 NOTE — Progress Notes (Signed)
Transported patient from [place] room 12 on 100% NRB and 25LPM cannula. Once patient arrived in the unit placed patient on bipap. CCM at bedside.

## 2020-11-12 NOTE — Progress Notes (Signed)
Paged by RN at [place]: patient sustaining SpO2 79% for 10 minutes on 50 L heated HFNC, up from 40L, and 15 L NRB. Breathing unlabored. Reportedly A&Ox3. RR RN at bedside.  Discussed increasing HFNC rate to 55 L, went to evaluate patient at bedside.   SUBJECTIVE/OBJECTIVE:   RN and RR RN at bedside. Patient seems to have difficulty hearing over sound of oxygen. , however oriented to self, place (hospital, though unable to identify [place]), but not year. Breathing appears unlabored, no accessory muscle use. Rales over bilateral lower lung bases anteriorly. Patient afebrile, RR 24-27, SBP 130s, SpO2 85% on 55 L HHFNC and 15 L NRB.   ASSESSMENT/PLAN:  Patient with progressive hypoxia requiring escalation of oxygen report, now appears slightly confused, A&Ox1-2. Earlier in the day noted to have occasionally taken off his oxygen with severe drops in sats, however per RN, he has been adherent with oxygen support this evening. Given his escalation of oxygen support requiring such high levels of support, advise escalation to BIPAP. Per RR RN, requires consultation of PCCM before transfer to floor with BIPAP capabilities.   - STAT ABG - Will consult PCCM for further recommendations/evaluation

## 2020-11-12 NOTE — Progress Notes (Signed)
[place] Progress Note Patient Name: Manville Rico DOB: [birth year] MRN: [id number]   Date of Service  11/12/2020  HPI/Events of Note  Agitated delirium on BiPAP, patient is pulling off his mask.  eICU Interventions  Precedex ordered.        Kerry Kass Duell Holdren 11/12/2020, 5:06 AM

## 2020-11-12 NOTE — Consult Note (Signed)
NAME:  Michael Singleton, MRN:  [id number], DOB:  16-Oct-1956, LOS: 2 ADMISSION DATE:  10/26/2020, CONSULTATION DATE:  11/20 REFERRING MD:  Graciella Freer, CHIEF COMPLAINT:  Respiratory failure    Brief History   64yo male with hx HTN, non-hodgkins lymphoma, CVA, CAD, PAD s/p L AKA, HTN initially admitted 11/18 with COVID PNA.  He was treated with baricitinib, remdesivir and dexamethasone. On 11/20 he had progressive dyspnea and hypoxia requiring 50L heated HFNC plus NRB with sats still 70's.  PCCM consulted for ICU tx.   History of present illness   64yo male with hx HTN, non-hodgkins lymphoma, CVA, CAD, PAD s/p L AKA, HTN initially admitted 11/18 with COVID PNA.  He was treated with baricitinib, remdesivir and dexamethasone. On 11/20 he had progressive dyspnea and hypoxia requiring 50L heated HFNC plus NRB with sats still 70's.  PCCM consulted for ICU tx.   PT has NOT had covid vaccine.   Past Medical History   has a past medical history of Allergic rhinitis, cause unspecified, Anginal pain ([place]), Anxiety, Arthritis, CAD (coronary artery disease), Carotid artery disease ([place]), Chronic lower back pain, Closed TBI (traumatic brain injury) ([place]) (1974), Depression, Diffuse large B cell lymphoma ([place]), Diverticulosis of colon (without mention of hemorrhage), Falls frequently, GERD (gastroesophageal reflux disease), Heart murmur, Hyperlipemia, Lumbago, Lumbar disc disease, Male infertility, unspecified, Migraines, Myocardial infarction ([place]) (~ 1995), NHL (non-Hodgkin's lymphoma) ([place]) (10/09/2013), OSA (obstructive sleep apnea), Posttraumatic stress disorder, Unspecified essential hypertension, Unspecified hypothyroidism, Unspecified vitamin D deficiency (10/12/2013), and Ventricular bigeminy.   Significant Hospital Events     Consults:    Procedures:    Significant Diagnostic Tests:  CTA chest 11/18>>> 1. No evidence of pulmonary embolism to the lobar branch level. 2. Extensive ground-glass  consolidation throughout the bilateral lung fields, consistent with multifocal pneumonia in the setting of known COVID-19 infection.  Micro Data:  COVID 11/18>>> POS  BC x 2 11/18>>>   Antimicrobials:    Interim history/subjective:  tx to ICU.  More comfortable on bipap.  Remains somewhat confused.   Objective   Blood pressure (!) 174/143, pulse 76, temperature 98.5 F (36.9 C), temperature source Axillary, resp. rate (!) 21, height _0  (1.651 m), weight 111 kg, SpO2 (!) 86 %.    Vent Mode: BIPAP FiO2 (%):  [100 %] 100 %   Intake/Output Summary (Last 24 hours) at 11/12/2020 0248 Last data filed at 11/12/2020 1324 Gross per 24 hour  Intake 360 ml  Output 875 ml  Net -515 ml   Filed Weights   11/06/2020 0853 11/12/20 0228  Weight: 111.9 kg 111 kg    Examination: General: chronically ill appearing male, dyspneic but improved on bipap  HENT: mm moist, no JVD, bipap mask  Lungs: resps even non labored on bipap, diminished  Cardiovascular: s1s2 rrr Abdomen: round, soft, non tender  Extremities: warm and dry, L AKA Neuro: awake, confused, oriented x1, follows commands    Resolved Hospital Problem list     Assessment & Plan:   Acute hypoxic respiratory failure r/t COVID PNA  PLAN -  - bipap as tol  - Continue baricitinib 27m daily (day 3/5) -Continueremdesivir (day 3/5) -Continue dexamethasone (day 3/10) -Trend CMP, inflammatory markers - f/u ABG in 1 hr  - f/u CXR in am   Hx HTN  Hx VT (s/p ablation)  PLAN -  Continue home metoprolol   Hx dCHF  Hx CAD  PLAN -  Continue ASA, plavix, statin, daily lasix   Hx PTSD  PLAN -  Continue home trazodone, duloxetine, wellbutrin  Holding home gabapentin, amitriptyline   Best practice:  Diet: NPO Pain/Anxiety/Delirium protocol (if indicated): n/a VAP protocol (if indicated): n/a DVT prophylaxis: lovenox GI prophylaxis: n/a Glucose control: SSI Mobility: BR, turn Code Status: full Family Communication:  wife updated 11/20 Disposition: ICU   Labs   CBC: Recent Labs  Lab 11/15/2020 0755 11/20/2020 0818 11/11/20 0453 11/12/20 0105  WBC 3.9*  --  5.7 9.2  NEUTROABS 3.2  --  4.8 7.4  HGB 13.5 13.3 13.8 14.4  HCT 40.0 39.0 40.3 43.1  MCV 88.3  --  86.5 87.6  PLT 180  --  202 177    Basic Metabolic Panel: Recent Labs  Lab 11/09/2020 0755 11/14/2020 0818 11/11/20 0453  NA 130* 129* 134*  K 4.2 4.1 4.2  CL 94*  --  100  CO2 21*  --  22  GLUCOSE 126*  --  166*  BUN 29*  --  22  CREATININE 1.24  --  0.86  CALCIUM 7.9*  --  7.8*  MG  --   --  2.9*   GFR: Estimated Creatinine Clearance: 98.5 mL/min (by C-G formula based on SCr of 0.86 mg/dL). Recent Labs  Lab 10/28/2020 0755 10/24/2020 1000 11/11/20 0453 11/12/20 0105  PROCALCITON 1.59  --  0.97  --   WBC 3.9*  --  5.7 9.2  LATICACIDVEN 1.9 1.6  --   --     Liver Function Tests: Recent Labs  Lab 10/28/2020 0755 11/11/20 0453  AST 136* 128*  ALT 60* 54*  ALKPHOS 124 137*  BILITOT 1.0 0.8  PROT 6.7 6.8  ALBUMIN 3.1* 2.9*   No results for input(s): LIPASE, AMYLASE in the last 168 hours. No results for input(s): AMMONIA in the last 168 hours.  ABG    Component Value Date/Time   PHART 7.439 11/12/2020 0147   PCO2ART 38.5 11/12/2020 0147   PO2ART 43.5 (L) 11/12/2020 0147   HCO3 25.7 11/12/2020 0147   TCO2 25 10/26/2020 0818   ACIDBASEDEF 1.0 11/09/2020 0818   O2SAT 75.9 11/12/2020 0147     Coagulation Profile: No results for input(s): INR, PROTIME in the last 168 hours.  Cardiac Enzymes: No results for input(s): CKTOTAL, CKMB, CKMBINDEX, TROPONINI in the last 168 hours.  HbA1C: Hgb A1c MFr Bld  Date/Time Value Ref Range Status  06/14/2017 09:06 PM 5.7 (H) 4.8 - 5.6 % Final    Comment:    (NOTE)         Pre-diabetes: 5.7 - 6.4         Diabetes: >6.4         Glycemic control for adults with diabetes: <7.0     CBG: Recent Labs  Lab 11/11/20 0741 11/11/20 1202 11/11/20 1757 11/11/20 2112 11/12/20 0219    GLUCAP 189* 191* 165* 169* 147*    Review of Systems:   As per HPI - All other systems reviewed and were neg.     Past Medical History  He,  has a past medical history of Allergic rhinitis, cause unspecified, Anginal pain ([place]), Anxiety, Arthritis, CAD (coronary artery disease), Carotid artery disease ([place]), Chronic lower back pain, Closed TBI (traumatic brain injury) ([place]) (1974), Depression, Diffuse large B cell lymphoma ([place]), Diverticulosis of colon (without mention of hemorrhage), Falls frequently, GERD (gastroesophageal reflux disease), Heart murmur, Hyperlipemia, Lumbago, Lumbar disc disease, Male infertility, unspecified, Migraines, Myocardial infarction ([place]) (~ 1995), NHL (non-Hodgkin's lymphoma) ([place]) (10/09/2013), OSA (obstructive sleep apnea), Posttraumatic stress  disorder, Unspecified essential hypertension, Unspecified hypothyroidism, Unspecified vitamin D deficiency (10/12/2013), and Ventricular bigeminy.   Surgical History    Past Surgical History:  Procedure Laterality Date  . CARDIAC CATHETERIZATION  05/04/2016  . CARDIAC CATHETERIZATION N/A 05/17/2016   Procedure: Coronary Stent Intervention;  Surgeon: Burnell Blanks, MD;  Location: [place] CV LAB;  Service: Cardiovascular;  Laterality: N/A;  . CARDIAC CATHETERIZATION N/A 05/17/2016   Procedure: Left Heart Cath and Coronary Angiography;  Surgeon: Burnell Blanks, MD;  Location: [place] [facility];  Service: Cardiovascular;  Laterality: N/A;  . CARDIAC CATHETERIZATION  05/17/2016   Procedure: Intravascular Pressure Wire/FFR Study;  Surgeon: Burnell Blanks, MD;  Location: [place] [facility];  Service: Cardiovascular;;  . CORONARY ANGIOGRAPHY N/A 06/17/2017   Procedure: CORONARY ANGIOGRAPHY (CATH LAB);  Surgeon: Lorretta Harp, MD;  Location: [place] [facility];  Service: Cardiovascular;  Laterality: N/A;  . [place]; 08/05/2013; 05/17/2016   "1 + 2 + 1"   .  DEEP NECK LYMPH NODE BIOPSY / EXCISION Right 07/2010  . FINGER AMPUTATION Left 1980's   thumb  . ICD IMPLANT N/A 06/18/2017   Procedure: ICD Implant;  Surgeon: Evans Lance, MD;  Location: [place] [facility];  Service: Cardiovascular;  Laterality: N/A;  . INGUINAL HERNIA REPAIR Left 2013  . LEAD REVISION/REPAIR N/A 03/17/2018   Procedure: ICD LEAD REVISION/REPAIR;  Surgeon: Evans Lance, MD;  Location: [place] [facility];  Service: Cardiovascular;  Laterality: N/A;  . LEFT HEART CATHETERIZATION WITH CORONARY ANGIOGRAM N/A 08/05/2013   Procedure: LEFT HEART CATHETERIZATION WITH CORONARY ANGIOGRAM;  Surgeon: Burnell Blanks, MD;  Location[facility] C[facility] LAB;  Service: Cardiovascular;  Laterality: N/A;  . RADICAL NECK DISSECTION Right ~ 09/2010   "'for lymphoma;  (Dr Erik Obey)" (5/64/3329)  . SALIVARY GLAND SURGERY Right ~ 09/2010  . SHOULDER ARTHROSCOPY W/ ROTATOR CUFF REPAIR Right   . SHOULDER ARTHROSCOPY W/ ROTATOR CUFF REPAIR Left 2000's  . TONSILLECTOMY Right ~ 09/2010  . UMBILICAL HERNIA REPAIR  ?1990's     Social History   reports that he quit smoking about 16 years ago. His smoking use included cigarettes. He has a 52.50 pack-year smoking history. He has never used smokeless tobacco. He reports current alcohol use. He reports current drug use. Drug: Cocaine.   Family History   His family history includes Alcohol abuse in an other family member; Cancer in his mother; Heart attack in his father; Heart disease in his mother.   Allergies Allergies  Allergen Reactions  . Fish Allergy Nausea And Vomiting  . Shellfish Allergy Nausea And Vomiting  . Tape Other (See Comments)    Plastic tape pulls off top layer of skin. Paper/cloth tape ok  . Morphine Other (See Comments)    Delusions     Home Medications  Prior to Admission medications   Medication Sig Start Date End Date Taking? Authorizing Provider  amitriptyline (ELAVIL) 25 MG tablet Take 25 mg by mouth at bedtime as needed  for sleep.   Yes [provider]  ascorbic acid (VITAMIN C) 500 MG tablet TAKE ONE TABLET BY MOUTH DAILY -  PLEASE TAKE WITH YOUR IRON SUPPLEMENT, IT WILL HELP IT WORKED BETTER,  VITAMIN C HELPS HER IMMUNE SYSTEM TOO. -  PLEASE TAKE WITH YOUR IRON SUPPLEMENT, IT WILL HELP IT WORKED BETTER,   VITAMIN C HELPS HER IMMUNE SYSTEM TOO. 08/21/19  Yes [provider]  aspirin 81 MG tablet  Take 1 tablet (81 mg total) by mouth daily. 08/06/13  Yes Theora Gianotti, NP  atorvastatin (LIPITOR) 10 MG tablet Take 10 mg by mouth every evening.   Yes [provider]  buPROPion (WELLBUTRIN XL) 300 MG 24 hr tablet Take 300 mg by mouth daily.   Yes [provider]  Carboxymethylcell-Hypromellose 0.25-0.3 % GEL Apply 2 drops to eye in the morning, at noon, in the evening, and at bedtime. Both eyes.   Yes [provider]  cholecalciferol 2000 UNITS TABS Take 2,000 Units by mouth daily. 10/12/13  Yes Heath Lark, MD  clopidogrel (PLAVIX) 75 MG tablet Take 1 tablet (75 mg total) by mouth daily with breakfast. 08/06/13  Yes Theora Gianotti, NP  cyclobenzaprine (FLEXERIL) 10 MG tablet Take 1 tablet by mouth at bedtime. 08/21/19  Yes [provider]  diclofenac (VOLTAREN) 75 MG EC tablet Take 75 mg by mouth every evening.   Yes [provider]  docusate sodium (COLACE) 100 MG capsule Take 100 mg by mouth daily as needed for mild constipation.   Yes [provider]  DULoxetine (CYMBALTA) 60 MG capsule Take 60 mg by mouth daily.   Yes [provider]  ferrous sulfate 325 (65 FE) MG tablet TAKE ONE TABLET BY MOUTH TUESDAY, THURSDAY AND SATURDAY FOR LOW IRON, TAKE WITH VITAMIN C 500 MG (ASCORBIC ACID) 08/21/19  Yes [provider]  furosemide (LASIX) 20 MG tablet Take 20 mg by mouth daily.   Yes [provider]  gabapentin (NEURONTIN) 300 MG capsule Take 300 mg by mouth 3 (three) times daily.   Yes [provider]  hydrOXYzine (VISTARIL) 25 MG capsule Take 25 mg by mouth 3 (three) times daily as needed for anxiety.   Yes [provider]  levothyroxine (SYNTHROID) 137 MCG tablet Take 137 mcg by mouth 2 (two) times daily before a meal.   Yes [provider]  Lidocaine-Adhesive Sheets (LIDOPURE PATCH) 5 % KIT Apply 1 patch topically daily.   Yes [provider]  metoprolol succinate (TOPROL-XL) 100 MG 24 hr tablet Take 50 mg by mouth every evening. Take with or immediately following a meal.   Yes [provider]  mirtazapine (REMERON) 15 MG tablet Take 15 mg by mouth at bedtime.  01/25/20  Yes [provider]  Multiple Vitamin (MULTIVITAMIN WITH MINERALS) TABS tablet Take 1 tablet by mouth daily.   Yes [provider]  mupirocin ointment (BACTROBAN) 2 % 1 application 3 (three) times daily.   Yes [provider]  pantoprazole (PROTONIX) 40 MG tablet Take 1 tablet (40 mg total) by mouth daily. 08/14/13  Yes Theora Gianotti, NP  phenylephrine (HEMORRHOIDAL) 0.25 % suppository Place 1 suppository rectally 2 (two) times daily.   Yes [provider]  polyethylene glycol powder (GLYCOLAX/MIRALAX) 17 GM/SCOOP powder Take by mouth. 07/18/17  Yes [provider]  potassium chloride (MICRO-K) 10 MEQ CR capsule Take by mouth.   Yes [provider]  senna (SENOKOT) 8.6 MG tablet Take 2 tablets by mouth daily.    Yes [provider]  sotalol (BETAPACE) 120 MG tablet Take 120 mg by mouth 2 (two) times daily.   Yes [provider]  traZODone (DESYREL) 100 MG tablet Take 200 mg by mouth at bedtime.   Yes [provider]  vitamin E 180 MG (400 UNITS) capsule TAKE 1 CAPSULE BY MOUTH TUESDAY, THURSDAY AND SATURDAY 08/21/19  Yes [provider]  atorvastatin (LIPITOR) 80 MG tablet  Take 1 tablet (80 mg total) by mouth daily. Patient not taking: Reported on 10/25/2020 08/06/13   Theora Gianotti, NP  nitroGLYCERIN (NITROSTAT) 0.4 MG SL tablet Place 1 tablet (0.4 mg total) under the tongue every 5 (five) minutes x 3 doses as needed for chest pain. Patient not taking: Reported on 11/14/2020 06/05/16   Imogene Burn, PA-C  sotalol (BETAPACE) 80 MG tablet Take 1-1.5 tablets (80-120 mg total) by mouth See admin instructions. Take 120 mg by mouth in the morning and 80 mg at night Patient not taking: Reported on 10/29/2020 03/18/18   Baldwin [place], PA-C     Critical care time: 1mns     Katy Lianette Broussard, NP Pulmonary/Critical Care Medicine  11/12/2020  2:48 AM

## 2020-11-12 NOTE — Significant Event (Signed)
Notified MD of Rn concerned for patient safety and staff safety. Notified patient punching at staff, not following instructions, attempting to get out of bed despite multiple RN re-orientation. MD video camera in room and to insert orders. Rn to monitor patient as safely as possibly.

## 2020-11-12 NOTE — Consult Note (Signed)
NAME:  Michael Singleton, MRN:  [id number], DOB:  Aug 20, 1956, LOS: 2 ADMISSION DATE:  10/27/2020, CONSULTATION DATE:  11/20 REFERRING MD:  Graciella Freer, CHIEF COMPLAINT:  Respiratory failure    Brief History   64yo male with hx HTN, non-hodgkins lymphoma, CVA, CAD, PAD s/p L AKA, HTN initially admitted 11/18 with COVID PNA.  He was treated with baricitinib, remdesivir and dexamethasone. On 11/20 he had progressive dyspnea and hypoxia requiring 50L heated HFNC plus NRB with sats still 70's.  PCCM consulted for ICU tx.   History of present illness   64yo male with hx HTN, non-hodgkins lymphoma, CVA, CAD, PAD s/p L AKA, HTN initially admitted 11/18 with COVID PNA.  He was treated with baricitinib, remdesivir and dexamethasone. On 11/20 he had progressive dyspnea and hypoxia requiring 50L heated HFNC plus NRB with sats still 70's.  PCCM consulted for ICU tx.   PT has NOT had covid vaccine.   Past Medical History   has a past medical history of Allergic rhinitis, cause unspecified, Anginal pain ([place]), Anxiety, Arthritis, CAD (coronary artery disease), Carotid artery disease ([place]), Chronic lower back pain, Closed TBI (traumatic brain injury) ([place]) (1974), Depression, Diffuse large B cell lymphoma ([place]), Diverticulosis of colon (without mention of hemorrhage), Falls frequently, GERD (gastroesophageal reflux disease), Heart murmur, Hyperlipemia, Lumbago, Lumbar disc disease, Male infertility, unspecified, Migraines, Myocardial infarction ([place]) (~ 1995), NHL (non-Hodgkin's lymphoma) ([place]) (10/09/2013), OSA (obstructive sleep apnea), Posttraumatic stress disorder, Unspecified essential hypertension, Unspecified hypothyroidism, Unspecified vitamin D deficiency (10/12/2013), and Ventricular bigeminy.   Significant Hospital Events     Consults:    Procedures:    Significant Diagnostic Tests:  CTA chest 11/18>>> 1. No evidence of pulmonary embolism to the lobar branch level. 2. Extensive ground-glass  consolidation throughout the bilateral lung fields, consistent with multifocal pneumonia in the setting of known COVID-19 infection.  Micro Data:  COVID 11/18>>> POS  BC x 2 11/18>>>   Antimicrobials:    Interim history/subjective:  Remains on BiPAP. Trial off BiPAP with normal respirations however   Objective   Blood pressure (!) 110/59, pulse 71, temperature 98.6 F (37 C), temperature source Axillary, resp. rate (!) 22, height 5\' 5"  (1.651 m), weight 111 kg, SpO2 (!) 81 %.    Vent Mode: BIPAP FiO2 (%):  [100 %] 100 %   Intake/Output Summary (Last 24 hours) at 11/12/2020 1026 Last data filed at 11/12/2020 1000 Gross per 24 hour  Intake 247.24 ml  Output 1000 ml  Net -752.76 ml   Filed Weights   11/07/2020 0853 11/12/20 0228  Weight: 111.9 kg 111 kg   Physical Exam: General: Chronically ill-appearing, no acute distress HENT: [place], AT, BiPAP in place Eyes: EOMI, no scleral icterus Respiratory: Vented breath sounds bilaterally.  No crackles, wheezing or rales Cardiovascular: RRR, -M/R/G, no JVD GI: BS+, soft, nontender Extremities:-Edema,-tenderness Neuro: Awake, confused, intermittently follows commands  CXR 11/12/20 - Interval worsening of bilateral opacities  Resolved Hospital Problem list     Assessment & Plan:   Acute hypoxic respiratory failure r/t COVID PNA  PLAN -  - Continue BiPAP. Monitor respiratory distress and/or worsening mental status. High risk for intubation - Continue baricitinib 4mg  daily (day 3/5) - Continueremdesivir (day 3/5) - Transition dexamethasone to solumedrol (day 3/10) - Trend CMP, inflammatory markers  Hx HTN  Hx VT (s/p ablation)  PLAN -  Continue home metoprolol. Sotalol on home med list however not taking   Hx dCHF  Hx CAD  PLAN -  Continue ASA, plavix,  statin, daily lasix   Hx PTSD  PLAN -  Continue home trazodone, duloxetine, wellbutrin  Holding home gabapentin, amitriptyline  Best practice:  Diet:  NPO Pain/Anxiety/Delirium protocol (if indicated): n/a VAP protocol (if indicated): n/a DVT prophylaxis: lovenox GI prophylaxis: n/a Glucose control: SSI Mobility: BR, turn Code Status: full Family Communication: Updated wife on 11/20 Disposition: ICU   Labs   CBC: Recent Labs  Lab 10/30/2020 0755 11/04/2020 0818 11/11/20 0453 11/12/20 0105  WBC 3.9*  --  5.7 9.2  NEUTROABS 3.2  --  4.8 7.4  HGB 13.5 13.3 13.8 14.4  HCT 40.0 39.0 40.3 43.1  MCV 88.3  --  86.5 87.6  PLT 180  --  202 545    Basic Metabolic Panel: Recent Labs  Lab 11/12/2020 0755 10/27/2020 0818 11/11/20 0453 11/12/20 0105  NA 130* 129* 134* 135  K 4.2 4.1 4.2 4.4  CL 94*  --  100 97*  CO2 21*  --  22 24  GLUCOSE 126*  --  166* 156*  BUN 29*  --  22 29*  CREATININE 1.24  --  0.86 0.95  CALCIUM 7.9*  --  7.8* 8.4*  MG  --   --  2.9* 3.0*   GFR: Estimated Creatinine Clearance: 89.1 mL/min (by C-G formula based on SCr of 0.95 mg/dL). Recent Labs  Lab 11/05/2020 0755 11/09/2020 1000 11/11/20 0453 11/12/20 0105  PROCALCITON 1.59  --  0.97 0.74  WBC 3.9*  --  5.7 9.2  LATICACIDVEN 1.9 1.6  --   --     Liver Function Tests: Recent Labs  Lab 11/22/2020 0755 11/11/20 0453 11/12/20 0105  AST 136* 128* 135*  ALT 60* 54* 51*  ALKPHOS 124 137* 132*  BILITOT 1.0 0.8 0.7  PROT 6.7 6.8 6.9  ALBUMIN 3.1* 2.9* 3.1*   No results for input(s): LIPASE, AMYLASE in the last 168 hours. No results for input(s): AMMONIA in the last 168 hours.  ABG    Component Value Date/Time   PHART 7.439 11/12/2020 0147   PCO2ART 38.5 11/12/2020 0147   PO2ART 43.5 (L) 11/12/2020 0147   HCO3 25.7 11/12/2020 0147   TCO2 25 10/29/2020 0818   ACIDBASEDEF 1.0 11/07/2020 0818   O2SAT 75.9 11/12/2020 0147     Coagulation Profile: No results for input(s): INR, PROTIME in the last 168 hours.  Cardiac Enzymes: No results for input(s): CKTOTAL, CKMB, CKMBINDEX, TROPONINI in the last 168 hours.  HbA1C: Hgb A1c MFr Bld   Date/Time Value Ref Range Status  06/14/2017 09:06 PM 5.7 (H) 4.8 - 5.6 % Final    Comment:    (NOTE)         Pre-diabetes: 5.7 - 6.4         Diabetes: >6.4         Glycemic control for adults with diabetes: <7.0     CBG: Recent Labs  Lab 11/11/20 1757 11/11/20 2112 11/12/20 0219 11/12/20 0357 11/12/20 0857  GLUCAP 165* 169* 147* 153* 166*   Critical care time: 45 mins    The patient is critically ill with multiple organ systems failure and requires high complexity decision making for assessment and support, frequent evaluation and titration of therapies, application of advanced monitoring technologies and extensive interpretation of multiple databases.  Independent Critical Care Time: 53 Minutes.   Rodman Pickle, M.D. [facility] Pulmonary/Critical Care Medicine 11/12/2020 10:26 AM   Please see Amion for pager number to reach on-call Pulmonary and Critical Care Team.

## 2020-11-12 NOTE — Progress Notes (Signed)
Assisted tele visit to patient with family member.  Bayan Kushnir R, RN  

## 2020-11-13 DIAGNOSIS — J1282 Pneumonia due to coronavirus disease 2019: Secondary | ICD-10-CM | POA: Diagnosis not present

## 2020-11-13 DIAGNOSIS — U071 COVID-19: Secondary | ICD-10-CM | POA: Diagnosis not present

## 2020-11-13 LAB — GLUCOSE, CAPILLARY
Glucose-Capillary: 216 mg/dL — ABNORMAL HIGH (ref 70–99)
Glucose-Capillary: 217 mg/dL — ABNORMAL HIGH (ref 70–99)
Glucose-Capillary: 223 mg/dL — ABNORMAL HIGH (ref 70–99)
Glucose-Capillary: 246 mg/dL — ABNORMAL HIGH (ref 70–99)
Glucose-Capillary: 252 mg/dL — ABNORMAL HIGH (ref 70–99)
Glucose-Capillary: 256 mg/dL — ABNORMAL HIGH (ref 70–99)
Glucose-Capillary: 264 mg/dL — ABNORMAL HIGH (ref 70–99)

## 2020-11-13 LAB — COMPREHENSIVE METABOLIC PANEL
ALT: 45 U/L — ABNORMAL HIGH (ref 0–44)
AST: 104 U/L — ABNORMAL HIGH (ref 15–41)
Albumin: 2.6 g/dL — ABNORMAL LOW (ref 3.5–5.0)
Alkaline Phosphatase: 136 U/L — ABNORMAL HIGH (ref 38–126)
Anion gap: 9 (ref 5–15)
BUN: 49 mg/dL — ABNORMAL HIGH (ref 8–23)
CO2: 27 mmol/L (ref 22–32)
Calcium: 7.9 mg/dL — ABNORMAL LOW (ref 8.9–10.3)
Chloride: 103 mmol/L (ref 98–111)
Creatinine, Ser: 1.18 mg/dL (ref 0.61–1.24)
GFR, Estimated: >60 mL/min (ref >60)
Glucose, Bld: 258 mg/dL — ABNORMAL HIGH (ref 70–99)
Potassium: 4.7 mmol/L (ref 3.5–5.1)
Sodium: 139 mmol/L (ref 135–145)
Total Bilirubin: 1 mg/dL (ref 0.3–1.2)
Total Protein: 6.3 g/dL — ABNORMAL LOW (ref 6.5–8.1)

## 2020-11-13 LAB — CBC WITH DIFFERENTIAL/PLATELET
Abs Immature Granulocytes: 0.68 10*3/uL — ABNORMAL HIGH (ref 0.00–0.07)
Basophils Absolute: 0 10*3/uL (ref 0.0–0.1)
Basophils Relative: 0 %
Eosinophils Absolute: 0 10*3/uL (ref 0.0–0.5)
Eosinophils Relative: 0 %
HCT: 40.2 % (ref 39.0–52.0)
Hemoglobin: 13.1 g/dL (ref 13.0–17.0)
Immature Granulocytes: 7 %
Lymphocytes Relative: 10 %
Lymphs Abs: 0.9 10*3/uL (ref 0.7–4.0)
MCH: 29.1 pg (ref 26.0–34.0)
MCHC: 32.6 g/dL (ref 30.0–36.0)
MCV: 89.3 fL (ref 80.0–100.0)
Monocytes Absolute: 0.5 10*3/uL (ref 0.1–1.0)
Monocytes Relative: 5 %
Neutro Abs: 7.1 10*3/uL (ref 1.7–7.7)
Neutrophils Relative %: 78 %
Platelets: 243 10*3/uL (ref 150–400)
RBC: 4.5 MIL/uL (ref 4.22–5.81)
RDW: 13.5 % (ref 11.5–15.5)
WBC: 9.2 10*3/uL (ref 4.0–10.5)
nRBC: 1.4 % — ABNORMAL HIGH (ref 0.0–0.2)

## 2020-11-13 LAB — D-DIMER, QUANTITATIVE: D-Dimer, Quant: 2.96 ug/mL-FEU — ABNORMAL HIGH (ref 0.00–0.50)

## 2020-11-13 LAB — C-REACTIVE PROTEIN: CRP: 18.1 mg/dL — ABNORMAL HIGH (ref <1.0)

## 2020-11-13 LAB — FERRITIN: Ferritin: 2350 ng/mL — ABNORMAL HIGH (ref 24–336)

## 2020-11-13 LAB — MAGNESIUM: Magnesium: 3.6 mg/dL — ABNORMAL HIGH (ref 1.7–2.4)

## 2020-11-13 MED ORDER — INSULIN ASPART 100 UNIT/ML ~~LOC~~ SOLN
0.0000 [IU] | SUBCUTANEOUS | Status: DC
  Administered 2020-11-13 – 2020-11-14 (×5): 7 [IU] via SUBCUTANEOUS
  Administered 2020-11-14: 4 [IU] via SUBCUTANEOUS
  Administered 2020-11-14 (×2): 11 [IU] via SUBCUTANEOUS
  Administered 2020-11-15 – 2020-11-16 (×7): 7 [IU] via SUBCUTANEOUS
  Administered 2020-11-16: 11 [IU] via SUBCUTANEOUS
  Administered 2020-11-16 (×3): 7 [IU] via SUBCUTANEOUS
  Administered 2020-11-16: 4 [IU] via SUBCUTANEOUS
  Administered 2020-11-17 (×4): 15 [IU] via SUBCUTANEOUS
  Administered 2020-11-17 (×2): 11 [IU] via SUBCUTANEOUS
  Administered 2020-11-18: 15 [IU] via SUBCUTANEOUS
  Administered 2020-11-18: 20 [IU] via SUBCUTANEOUS
  Administered 2020-11-18: 11 [IU] via SUBCUTANEOUS
  Administered 2020-11-18 (×2): 15 [IU] via SUBCUTANEOUS
  Administered 2020-11-18: 7 [IU] via SUBCUTANEOUS
  Administered 2020-11-19: 11 [IU] via SUBCUTANEOUS
  Administered 2020-11-19: 15 [IU] via SUBCUTANEOUS
  Administered 2020-11-19: 4 [IU] via SUBCUTANEOUS

## 2020-11-13 NOTE — Plan of Care (Signed)
  Problem: Education: Goal: Knowledge of General Education information will improve Description Including pain rating scale, medication(s)/side effects and non-pharmacologic comfort measures Outcome: Progressing   

## 2020-11-13 NOTE — Progress Notes (Signed)
Patient resting in bed with eyes closed near end of shift. Patient continues on bi-pap at 70%. Current RASS -1. Patient disoriented when awake and will attempt to pull at tubes and climb out of bed. Will calm himself when reoriented. Patient remains NPO at this time. U/O 400 during shift. No BM noted. Gtts continue per [facility]. Safety measures remain in place, will SBARR to day shift nurse at change of shift.

## 2020-11-13 NOTE — Progress Notes (Signed)
NAME:  Michael Singleton, MRN:  [id number], DOB:  [birth year], LOS: 3 ADMISSION DATE:  10/25/2020, CONSULTATION DATE:  11/20 REFERRING MD:  Graciella Freer, CHIEF COMPLAINT:  Respiratory failure    Brief History   64yo male with hx HTN, non-hodgkins lymphoma, CVA, CAD, PAD s/p L AKA, HTN initially admitted 11/18 with COVID PNA.  He was treated with baricitinib, remdesivir and dexamethasone. On 11/20 he had progressive dyspnea and hypoxia requiring 50L heated HFNC plus NRB with sats still 70's.  PCCM consulted for ICU tx.   History of present illness   64yo male with hx HTN, non-hodgkins lymphoma, CVA, CAD, PAD s/p L AKA, HTN initially admitted 11/18 with COVID PNA.  He was treated with baricitinib, remdesivir and dexamethasone. On 11/20 he had progressive dyspnea and hypoxia requiring 50L heated HFNC plus NRB with sats still 70's.  PCCM consulted for ICU tx.   PT has NOT had covid vaccine.   Past Medical History   has a past medical history of Allergic rhinitis, cause unspecified, Anginal pain ([place]), Anxiety, Arthritis, CAD (coronary artery disease), Carotid artery disease ([place]), Chronic lower back pain, Closed TBI (traumatic brain injury) ([place]) (1974), Depression, Diffuse large B cell lymphoma ([place]), Diverticulosis of colon (without mention of hemorrhage), Falls frequently, GERD (gastroesophageal reflux disease), Heart murmur, Hyperlipemia, Lumbago, Lumbar disc disease, Male infertility, unspecified, Migraines, Myocardial infarction ([place]) (~ 1995), NHL (non-Hodgkin's lymphoma) ([place]) (10/09/2013), OSA (obstructive sleep apnea), Posttraumatic stress disorder, Unspecified essential hypertension, Unspecified hypothyroidism, Unspecified vitamin D deficiency (10/12/2013), and Ventricular bigeminy.   Significant Hospital Events     Consults:    Procedures:    Significant Diagnostic Tests:  CTA chest 11/18>>> 1. No evidence of pulmonary embolism to the lobar branch level. 2. Extensive ground-glass  consolidation throughout the bilateral lung fields, consistent with multifocal pneumonia in the setting of known COVID-19 infection.  Micro Data:  COVID 11/18>>> POS  BC x 2 11/18>>>   Antimicrobials:    Interim history/subjective:  Tolerating HHFNC  Objective   Blood pressure 132/61, pulse 60, temperature 98.1 F (36.7 C), temperature source Axillary, resp. rate 15, height 5\' 5"  (1.651 m), weight 109.4 kg, SpO2 90 %.    Vent Mode: BIPAP FiO2 (%):  [60 %-100 %] 100 % Set Rate:  [14 bmp] 14 bmp PEEP:  [8 cmH20] 8 cmH20 Plateau Pressure:  [11 cmH20-21 cmH20] 11 cmH20   Intake/Output Summary (Last 24 hours) at 11/13/2020 1255 Last data filed at 11/13/2020 1100 Gross per 24 hour  Intake 644.56 ml  Output 1425 ml  Net -780.44 ml   Filed Weights   11/20/2020 0853 11/12/20 0228 11/13/20 0451  Weight: 111.9 kg 111 kg 109.4 kg   Physical Exam: General: Chronically ill-appearing, no acute distress HENT: [place], AT, OP clear, MMM, HHFNC and NRB Eyes: EOMI, no scleral icterus Respiratory: Decreased breath sounds bilaterally Cardiovascular: RRR, -M/R/G, no JVD GI: BS+, soft, nontender Extremities:-Edema,-tenderness Neuro: Drowsy, follows commands intermittently  Resolved Hospital Problem list     Assessment & Plan:   Acute hypoxic respiratory failure r/t COVID PNA  PLAN -  - Continue HFFNC and BiPAP PRN - Monitor respiratory distress and/or worsening mental status. High risk for intubation - Continue baricitinib 4mg  daily (day 4/5) - Continueremdesivir (day 4/5) - Transition dexamethasone to solumedrol (day 4/10) - Trend CMP, inflammatory markers  Hx HTN  Hx VT (s/p ablation)  PLAN -  Continue home metoprolol. Sotalol on home med list however not taking   Hx dCHF  Hx CAD  PLAN -  Continue ASA, plavix, statin, daily lasix   Hx PTSD  PLAN -  Continue home trazodone, duloxetine, wellbutrin  Holding home gabapentin, amitriptyline  Best practice:  Diet:  NPO Pain/Anxiety/Delirium protocol (if indicated): n/a VAP protocol (if indicated): n/a DVT prophylaxis: lovenox GI prophylaxis: n/a Glucose control: SSI Mobility: BR, turn Code Status: full Family Communication: Updated wife on 11/21 Disposition: ICU   Labs   CBC: Recent Labs  Lab 10/28/2020 0755 11/17/2020 0818 11/11/20 0453 11/12/20 0105 11/13/20 0633  WBC 3.9*  --  5.7 9.2 9.2  NEUTROABS 3.2  --  4.8 7.4 7.1  HGB 13.5 13.3 13.8 14.4 13.1  HCT 40.0 39.0 40.3 43.1 40.2  MCV 88.3  --  86.5 87.6 89.3  PLT 180  --  202 235 706    Basic Metabolic Panel: Recent Labs  Lab 11/13/2020 0755 11/09/2020 0818 11/11/20 0453 11/12/20 0105 11/13/20 0633  NA 130* 129* 134* 135 139  K 4.2 4.1 4.2 4.4 4.7  CL 94*  --  100 97* 103  CO2 21*  --  22 24 27   GLUCOSE 126*  --  166* 156* 258*  BUN 29*  --  22 29* 49*  CREATININE 1.24  --  0.86 0.95 1.18  CALCIUM 7.9*  --  7.8* 8.4* 7.9*  MG  --   --  2.9* 3.0* 3.6*   GFR: Estimated Creatinine Clearance: 71.2 mL/min (by C-G formula based on SCr of 1.18 mg/dL). Recent Labs  Lab 11/09/2020 0755 11/09/2020 1000 11/11/20 0453 11/12/20 0105 11/13/20 0633  PROCALCITON 1.59  --  0.97 0.74  --   WBC 3.9*  --  5.7 9.2 9.2  LATICACIDVEN 1.9 1.6  --   --   --     Liver Function Tests: Recent Labs  Lab 10/26/2020 0755 11/11/20 0453 11/12/20 0105 11/13/20 0633  AST 136* 128* 135* 104*  ALT 60* 54* 51* 45*  ALKPHOS 124 137* 132* 136*  BILITOT 1.0 0.8 0.7 1.0  PROT 6.7 6.8 6.9 6.3*  ALBUMIN 3.1* 2.9* 3.1* 2.6*   No results for input(s): LIPASE, AMYLASE in the last 168 hours. No results for input(s): AMMONIA in the last 168 hours.  ABG    Component Value Date/Time   PHART 7.439 11/12/2020 0147   PCO2ART 38.5 11/12/2020 0147   PO2ART 43.5 (L) 11/12/2020 0147   HCO3 25.7 11/12/2020 0147   TCO2 25 10/31/2020 0818   ACIDBASEDEF 1.0 11/04/2020 0818   O2SAT 75.9 11/12/2020 0147     Coagulation Profile: No results for input(s): INR,  PROTIME in the last 168 hours.  Cardiac Enzymes: No results for input(s): CKTOTAL, CKMB, CKMBINDEX, TROPONINI in the last 168 hours.  HbA1C: Hgb A1c MFr Bld  Date/Time Value Ref Range Status  06/14/2017 09:06 PM 5.7 (H) 4.8 - 5.6 % Final    Comment:    (NOTE)         Pre-diabetes: 5.7 - 6.4         Diabetes: >6.4         Glycemic control for adults with diabetes: <7.0     CBG: Recent Labs  Lab 11/12/20 2011 11/13/20 0019 11/13/20 0422 11/13/20 0848 11/13/20 1247  GLUCAP 244* 246* 252* 256* 264*   Critical care time: 33 mins    The patient is critically ill with multiple organ systems failure and requires high complexity decision making for assessment and support, frequent evaluation and titration of therapies, application of advanced monitoring technologies and extensive interpretation of multiple databases.  Independent Critical Care Time: 33 Minutes.   Rodman Pickle, M.D. [facility] Pulmonary/Critical Care Medicine 11/13/2020 12:55 PM   Please see Amion for pager number to reach on-call Pulmonary and Critical Care Team.

## 2020-11-13 NOTE — Progress Notes (Signed)
Assisted tele visit to patient with wife.  Orlene Salmons R, RN  

## 2020-11-13 NOTE — Progress Notes (Signed)
Assisted tele visit to patient with family member.  Yeudiel Mateo R, RN  

## 2020-11-13 NOTE — Plan of Care (Signed)

## 2020-11-13 NOTE — Progress Notes (Signed)
Pt taken off bipap and placed on HHFNC at this time with no complications. VS within normal limits. No increased WOB, no Resp distress noted. Pt appears comfortable. RT will continue to monitor for bipap needs in future.

## 2020-11-14 DIAGNOSIS — U071 COVID-19: Secondary | ICD-10-CM | POA: Diagnosis not present

## 2020-11-14 DIAGNOSIS — J9602 Acute respiratory failure with hypercapnia: Secondary | ICD-10-CM | POA: Diagnosis not present

## 2020-11-14 DIAGNOSIS — J1282 Pneumonia due to coronavirus disease 2019: Secondary | ICD-10-CM | POA: Diagnosis not present

## 2020-11-14 LAB — D-DIMER, QUANTITATIVE: D-Dimer, Quant: 2.53 ug/mL-FEU — ABNORMAL HIGH (ref 0.00–0.50)

## 2020-11-14 LAB — COMPREHENSIVE METABOLIC PANEL
ALT: 57 U/L — ABNORMAL HIGH (ref 0–44)
AST: 131 U/L — ABNORMAL HIGH (ref 15–41)
Albumin: 3 g/dL — ABNORMAL LOW (ref 3.5–5.0)
Alkaline Phosphatase: 163 U/L — ABNORMAL HIGH (ref 38–126)
Anion gap: 14 (ref 5–15)
BUN: 45 mg/dL — ABNORMAL HIGH (ref 8–23)
CO2: 23 mmol/L (ref 22–32)
Calcium: 8.2 mg/dL — ABNORMAL LOW (ref 8.9–10.3)
Chloride: 108 mmol/L (ref 98–111)
Creatinine, Ser: 1.17 mg/dL (ref 0.61–1.24)
GFR, Estimated: >60 mL/min (ref >60)
Glucose, Bld: 217 mg/dL — ABNORMAL HIGH (ref 70–99)
Potassium: 4.9 mmol/L (ref 3.5–5.1)
Sodium: 145 mmol/L (ref 135–145)
Total Bilirubin: 1.4 mg/dL — ABNORMAL HIGH (ref 0.3–1.2)
Total Protein: 6.7 g/dL (ref 6.5–8.1)

## 2020-11-14 LAB — CBC WITH DIFFERENTIAL/PLATELET
Abs Immature Granulocytes: 0 10*3/uL (ref 0.00–0.07)
Basophils Absolute: 0 10*3/uL (ref 0.0–0.1)
Basophils Relative: 0 %
Eosinophils Absolute: 0 10*3/uL (ref 0.0–0.5)
Eosinophils Relative: 0 %
HCT: 45.7 % (ref 39.0–52.0)
Hemoglobin: 14.7 g/dL (ref 13.0–17.0)
Lymphocytes Relative: 2 %
Lymphs Abs: 0.3 10*3/uL — ABNORMAL LOW (ref 0.7–4.0)
MCH: 29.3 pg (ref 26.0–34.0)
MCHC: 32.2 g/dL (ref 30.0–36.0)
MCV: 91.2 fL (ref 80.0–100.0)
Monocytes Absolute: 0.3 10*3/uL (ref 0.1–1.0)
Monocytes Relative: 2 %
Neutro Abs: 12.6 10*3/uL — ABNORMAL HIGH (ref 1.7–7.7)
Neutrophils Relative %: 96 %
Platelets: 260 10*3/uL (ref 150–400)
RBC: 5.01 MIL/uL (ref 4.22–5.81)
RDW: 14.1 % (ref 11.5–15.5)
WBC: 13.1 10*3/uL — ABNORMAL HIGH (ref 4.0–10.5)
nRBC: 1.2 % — ABNORMAL HIGH (ref 0.0–0.2)
nRBC: 3 /100 WBC — ABNORMAL HIGH

## 2020-11-14 LAB — POCT I-STAT 7, (LYTES, BLD GAS, ICA,H+H)
Acid-Base Excess: 5 mmol/L — ABNORMAL HIGH (ref 0.0–2.0)
Bicarbonate: 28.8 mmol/L — ABNORMAL HIGH (ref 20.0–28.0)
Calcium, Ion: 1.08 mmol/L — ABNORMAL LOW (ref 1.15–1.40)
HCT: 40 % (ref 39.0–52.0)
Hemoglobin: 13.6 g/dL (ref 13.0–17.0)
O2 Saturation: 75 %
Patient temperature: 98.6
Potassium: 4.5 mmol/L (ref 3.5–5.1)
Sodium: 145 mmol/L (ref 135–145)
TCO2: 30 mmol/L (ref 22–32)
pCO2 arterial: 39.1 mmHg (ref 32.0–48.0)
pH, Arterial: 7.475 — ABNORMAL HIGH (ref 7.350–7.450)
pO2, Arterial: 37 mmHg — CL (ref 83.0–108.0)

## 2020-11-14 LAB — GLUCOSE, CAPILLARY
Glucose-Capillary: 219 mg/dL — ABNORMAL HIGH (ref 70–99)
Glucose-Capillary: 183 mg/dL — ABNORMAL HIGH (ref 70–99)
Glucose-Capillary: 201 mg/dL — ABNORMAL HIGH (ref 70–99)
Glucose-Capillary: 277 mg/dL — ABNORMAL HIGH (ref 70–99)
Glucose-Capillary: 276 mg/dL — ABNORMAL HIGH (ref 70–99)

## 2020-11-14 LAB — FERRITIN: Ferritin: 1965 ng/mL — ABNORMAL HIGH (ref 24–336)

## 2020-11-14 LAB — C-REACTIVE PROTEIN: CRP: 11.1 mg/dL — ABNORMAL HIGH (ref <1.0)

## 2020-11-14 LAB — MAGNESIUM: Magnesium: 4 mg/dL — ABNORMAL HIGH (ref 1.7–2.4)

## 2020-11-14 MED ORDER — INSULIN GLARGINE 100 UNIT/ML ~~LOC~~ SOLN
8.0000 [IU] | Freq: Every day | SUBCUTANEOUS | Status: DC
  Administered 2020-11-14: 8 [IU] via SUBCUTANEOUS
  Filled 2020-11-14 (×2): qty 0.08

## 2020-11-14 NOTE — Progress Notes (Addendum)
NAME:  Michael Singleton, MRN:  [id number], DOB:  [birth year], LOS: 4 ADMISSION DATE:  11/14/2020, CONSULTATION DATE:  11/20 REFERRING MD:  Graciella Freer, CHIEF COMPLAINT:  Respiratory failure    Brief History   64 y/o, un-vaccinated, male with hx HTN, non-hodgkins lymphoma, CVA, CAD, PAD s/p L AKA, HTN initially admitted 11/18 with COVID PNA.  He was treated with baricitinib, remdesivir and dexamethasone. On 11/20 he had progressive dyspnea and hypoxia requiring 50L heated HFNC plus NRB with sats still 70's.  PCCM consulted for ICU tx.   Past Medical History  Anxiety  Chronic Back Pain  TBI Depression  PTSD  Diffuse Large B Cell Lymphoma  Diverticulosis  GERD HTN HLD  CAD s/p MI  OSA  Hypothyroidism  Vitamin D Deficiency   Significant Hospital Events   11/18 Admit  11/20 Tx to ICU, PCCM consulted   Consults:    Procedures:    Significant Diagnostic Tests:  CTA chest 11/18 >> No evidence of pulmonary embolism to the lobar branch level. Extensive ground-glass consolidation throughout the bilateral lung fields, consistent with multifocal pneumonia in the setting of known COVID-19 infection.  Micro Data:  COVID 11/18 >> Positive  Influenza A/B 11/18 >> negative  BCx2 11/18 >>   Antimicrobials:    Interim history/subjective:  Remains on BiPAP, 10 PEEP, 80% fiO2 On precedex 0.4 mcg Afebrile / WBC 13.1  Glucose range 183-260 I/O 1.4L UOP, -1.2L in last 24 hours   Objective   Blood pressure (!) 141/67, pulse 60, temperature 98.4 F (36.9 C), temperature source Axillary, resp. rate (!) 21, height 5\' 5"  (1.651 m), weight 109.4 kg, SpO2 90 %.    Vent Mode: BIPAP FiO2 (%):  [70 %-100 %] 80 % Set Rate:  [14 bmp] 14 bmp PEEP:  [8 cmH20-10 cmH20] 10 cmH20 Plateau Pressure:  [13 cmH20-17 cmH20] 13 cmH20   Intake/Output Summary (Last 24 hours) at 11/14/2020 6415 Last data filed at 11/14/2020 0800 Gross per 24 hour  Intake 379.29 ml  Output 1585 ml  Net -1205.71 ml   Filed  Weights   10/26/2020 0853 11/12/20 0228 11/13/20 0451  Weight: 111.9 kg 111 kg 109.4 kg   Physical Exam: General: chronically ill appearing adult male lying in bed in NAD on BiPAP   HEENT: MM pink/dry, BiPAP mask in place, anicteric  Neuro: drowsy, awakens to voice, on precedex, follows commands, nods CV: s1s2 rrr, no m/r/g PULM: non-labored on BiPAP, lungs bilaterally diminished but clear GI: soft, bsx4 active  Extremities: warm/dry, no RLE edema, L AKA amputation Skin: no rashes or lesions on exposed skin   Resolved Hospital Problem list     Assessment & Plan:   Acute hypoxic respiratory failure secondary to COVID PNA  -continue to cycle on / off BiPAP PRN  -heated high flow O2 as break off bipap  -continue baricitinib (has missed doses due to BiPAP) -continue remdesivir  -solumedrol 60 mg IV Q12  -follow inflammatory markers -intermittent CXR  -at high risk for intubation   Hx HTN  Hx VT (s/p ablation)  -continue metoprolol  -tele monitoring   Hx dCHF  Hx CAD  -ASA, plavix, statin, lasix   Hx PTSD  -continue home trazodone, wellbutrin, duloxetine  -hold home gabapentin, amitriptyline    Best practice:  Diet: NPO Pain/Anxiety/Delirium protocol (if indicated): precedex VAP protocol (if indicated): n/a DVT prophylaxis: lovenox GI prophylaxis: n/a Glucose control: SSI Mobility: BR, turn Code Status: full Family Communication: Wife updated via phone 11/22.  Reviewed level  of support (requiring bipap for longer intervals) and trends that are typically seen in COVID.  She indicates he has stated in the past that he would not want intubation but wants to give him every opportunity to get better.  We discussed that the ventilator support may not change the overall outcome in this setting.  She indicates understanding. Will follow up with wife.   Disposition: ICU   Labs   CBC: Recent Labs  Lab 11/13/2020 0755 11/12/2020 0818 11/11/20 0453 11/12/20 0105 11/13/20 0633  11/14/20 0500 11/14/20 0520  WBC 3.9*  --  5.7 9.2 9.2 13.1*  --   NEUTROABS 3.2  --  4.8 7.4 7.1 12.6*  --   HGB 13.5   < > 13.8 14.4 13.1 14.7 13.6  HCT 40.0   < > 40.3 43.1 40.2 45.7 40.0  MCV 88.3  --  86.5 87.6 89.3 91.2  --   PLT 180  --  202 235 243 260  --    < > = values in this interval not displayed.    Basic Metabolic Panel: Recent Labs  Lab 11/09/2020 0755 11/12/2020 0818 11/11/20 0453 11/12/20 0105 11/13/20 0633 11/14/20 0500 11/14/20 0520  NA 130*   < > 134* 135 139 145 145  K 4.2   < > 4.2 4.4 4.7 4.9 4.5  CL 94*  --  100 97* 103 108  --   CO2 21*  --  22 24 27 23   --   GLUCOSE 126*  --  166* 156* 258* 217*  --   BUN 29*  --  22 29* 49* 45*  --   CREATININE 1.24  --  0.86 0.95 1.18 1.17  --   CALCIUM 7.9*  --  7.8* 8.4* 7.9* 8.2*  --   MG  --   --  2.9* 3.0* 3.6* 4.0*  --    < > = values in this interval not displayed.   GFR: Estimated Creatinine Clearance: 71.8 mL/min (by C-G formula based on SCr of 1.17 mg/dL). Recent Labs  Lab 11/07/2020 0755 10/29/2020 0755 11/11/2020 1000 11/11/20 0453 11/12/20 0105 11/13/20 0633 11/14/20 0500  PROCALCITON 1.59  --   --  0.97 0.74  --   --   WBC 3.9*   < >  --  5.7 9.2 9.2 13.1*  LATICACIDVEN 1.9  --  1.6  --   --   --   --    < > = values in this interval not displayed.    Liver Function Tests: Recent Labs  Lab 11/22/2020 0755 11/11/20 0453 11/12/20 0105 11/13/20 0633 11/14/20 0500  AST 136* 128* 135* 104* 131*  ALT 60* 54* 51* 45* 57*  ALKPHOS 124 137* 132* 136* 163*  BILITOT 1.0 0.8 0.7 1.0 1.4*  PROT 6.7 6.8 6.9 6.3* 6.7  ALBUMIN 3.1* 2.9* 3.1* 2.6* 3.0*   No results for input(s): LIPASE, AMYLASE in the last 168 hours. No results for input(s): AMMONIA in the last 168 hours.  ABG    Component Value Date/Time   PHART 7.475 (H) 11/14/2020 0520   PCO2ART 39.1 11/14/2020 0520   PO2ART 37 (LL) 11/14/2020 0520   HCO3 28.8 (H) 11/14/2020 0520   TCO2 30 11/14/2020 0520   ACIDBASEDEF 1.0 11/22/2020 0818     O2SAT 75.0 11/14/2020 0520     Coagulation Profile: No results for input(s): INR, PROTIME in the last 168 hours.  Cardiac Enzymes: No results for input(s): CKTOTAL, CKMB, CKMBINDEX, TROPONINI in the last  168 hours.  HbA1C: Hgb A1c MFr Bld  Date/Time Value Ref Range Status  06/14/2017 09:06 PM 5.7 (H) 4.8 - 5.6 % Final    Comment:    (NOTE)         Pre-diabetes: 5.7 - 6.4         Diabetes: >6.4         Glycemic control for adults with diabetes: <7.0     CBG: Recent Labs  Lab 11/13/20 1630 11/13/20 2005 11/13/20 2335 11/14/20 0316 11/14/20 0756  GLUCAP 223* 217* 216* 219* 183*   Critical care time: 34 minutes      [name], MSN, NP-C, AGACNP-BC [facility] 11/14/2020, 9:29 AM   Please see [email or web address] for pager details.      Pulmonary critical care attending:  This is a 64 year old gentleman past medical history of hypertension, non-Hodgkin's lymphoma, CVA, coronary artery disease, PAD, history of AKA on the left, hypertension.  Patient currently on NIV.  Appears comfortable O2 sats stable.  BP (!) 145/67   Pulse 60   Temp 98.4 F (36.9 C) (Axillary)   Resp 18   Ht 5\' 5"  (1.651 m)   Wt 109.4 kg   SpO2 90%   BMI 40.13 kg/m   General: Obese male, chronically ill-appearing HEENT: Tracking appropriately Heart: Regular rate rhythm S1-S2 Lungs: Bilateral mechanically ventilated breath sounds on NIV, no crackles Abdomen: Obese soft nontender  Labs: Reviewed  Assessment: Acute hypoxemic respiratory failure secondary to COVID-19 pneumonia requiring NIPPV. Hypertension History of VT status post ablation Chronic diastolic heart failure Coronary artery disease History of PTSD Left AKA   Plan: Continue BiPAP as needed off BiPAP continue heated high flow nasal cannula Complete course of baricitinib IV Solu-Medrol 60 mg IV every 12 Remains high risk for intubation. Had some goals of care with patient's wife discussion today  as well as patient. Unsure if he would want mechanical life support in this situation.  At this time remains full code. We appreciate discussions with family.  [name], NP spoke with wife today.  This patient is critically ill with multiple organ system failure; which, requires frequent high complexity decision making, assessment, support, evaluation, and titration of therapies. This was completed through the application of advanced monitoring technologies and extensive interpretation of multiple databases. During this encounter critical care time was devoted to patient care services described in this note for 32 minutes.  Garner Nash, DO [facility] 11/14/2020 5:36 PM

## 2020-11-14 NOTE — Progress Notes (Signed)
[place] Progress Note Patient Name: Orlander Norwood DOB: 04-19-1956 MRN: [id number]   Date of Service  11/14/2020  HPI/Events of Note  Hypoxia - ABG on BiPAP = 7.475/39.1/37. Sat by oximetry = 97% and RR = 19. I very much doubt that this is an arterial sample and suspect that this sample is a VBG.   eICU Interventions  Continue present management.      Intervention Category Major Interventions: Hypoxemia - evaluation and management  Lysle Dingwall 11/14/2020, 5:38 AM

## 2020-11-14 NOTE — Plan of Care (Signed)
  Problem: Clinical Measurements: Goal: Ability to maintain clinical measurements within normal limits will improve Outcome: Progressing Goal: Respiratory complications will improve Outcome: Progressing   

## 2020-11-14 NOTE — Progress Notes (Signed)
Assisted tele visit to patient with wife.  Thomas, Bryndan Bilyk Renee, RN   

## 2020-11-14 NOTE — Plan of Care (Signed)
  Problem: Clinical Measurements: Goal: Respiratory complications will improve Outcome: Progressing   

## 2020-11-14 NOTE — Progress Notes (Signed)
ABG collected and Elink notified about the critical values. Hard stick and it is possible that the gas may be mixture of venous blood with arterial.

## 2020-11-15 ENCOUNTER — Inpatient Hospital Stay (HOSPITAL_COMMUNITY): Payer: No Typology Code available for payment source

## 2020-11-15 DIAGNOSIS — J1282 Pneumonia due to coronavirus disease 2019: Secondary | ICD-10-CM | POA: Diagnosis not present

## 2020-11-15 DIAGNOSIS — J9601 Acute respiratory failure with hypoxia: Secondary | ICD-10-CM | POA: Diagnosis not present

## 2020-11-15 DIAGNOSIS — U071 COVID-19: Secondary | ICD-10-CM | POA: Diagnosis not present

## 2020-11-15 LAB — CBC WITH DIFFERENTIAL/PLATELET
Abs Immature Granulocytes: 0.66 10*3/uL — ABNORMAL HIGH (ref 0.00–0.07)
Basophils Absolute: 0.1 10*3/uL (ref 0.0–0.1)
Basophils Relative: 1 %
Eosinophils Absolute: 0.1 10*3/uL (ref 0.0–0.5)
Eosinophils Relative: 0 %
HCT: 43.9 % (ref 39.0–52.0)
Hemoglobin: 13.9 g/dL (ref 13.0–17.0)
Immature Granulocytes: 6 %
Lymphocytes Relative: 6 %
Lymphs Abs: 0.7 10*3/uL (ref 0.7–4.0)
MCH: 29.9 pg (ref 26.0–34.0)
MCHC: 31.7 g/dL (ref 30.0–36.0)
MCV: 94.4 fL (ref 80.0–100.0)
Monocytes Absolute: 0.7 10*3/uL (ref 0.1–1.0)
Monocytes Relative: 6 %
Neutro Abs: 9.4 10*3/uL — ABNORMAL HIGH (ref 1.7–7.7)
Neutrophils Relative %: 81 %
Platelets: 226 10*3/uL (ref 150–400)
RBC: 4.65 MIL/uL (ref 4.22–5.81)
RDW: 14.4 % (ref 11.5–15.5)
WBC: 11.5 10*3/uL — ABNORMAL HIGH (ref 4.0–10.5)
nRBC: 0.6 % — ABNORMAL HIGH (ref 0.0–0.2)

## 2020-11-15 LAB — COMPREHENSIVE METABOLIC PANEL
ALT: 65 U/L — ABNORMAL HIGH (ref 0–44)
AST: 99 U/L — ABNORMAL HIGH (ref 15–41)
Albumin: 2.7 g/dL — ABNORMAL LOW (ref 3.5–5.0)
Alkaline Phosphatase: 151 U/L — ABNORMAL HIGH (ref 38–126)
Anion gap: 12 (ref 5–15)
BUN: 34 mg/dL — ABNORMAL HIGH (ref 8–23)
CO2: 28 mmol/L (ref 22–32)
Calcium: 8.1 mg/dL — ABNORMAL LOW (ref 8.9–10.3)
Chloride: 112 mmol/L — ABNORMAL HIGH (ref 98–111)
Creatinine, Ser: 0.98 mg/dL (ref 0.61–1.24)
GFR, Estimated: >60 mL/min (ref >60)
Glucose, Bld: 241 mg/dL — ABNORMAL HIGH (ref 70–99)
Potassium: 4.6 mmol/L (ref 3.5–5.1)
Sodium: 152 mmol/L — ABNORMAL HIGH (ref 135–145)
Total Bilirubin: 1 mg/dL (ref 0.3–1.2)
Total Protein: 6.5 g/dL (ref 6.5–8.1)

## 2020-11-15 LAB — PHOSPHORUS
Phosphorus: 4 mg/dL (ref 2.5–4.6)
Phosphorus: 4.5 mg/dL (ref 2.5–4.6)

## 2020-11-15 LAB — GLUCOSE, CAPILLARY
Glucose-Capillary: 205 mg/dL — ABNORMAL HIGH (ref 70–99)
Glucose-Capillary: 222 mg/dL — ABNORMAL HIGH (ref 70–99)
Glucose-Capillary: 230 mg/dL — ABNORMAL HIGH (ref 70–99)
Glucose-Capillary: 240 mg/dL — ABNORMAL HIGH (ref 70–99)
Glucose-Capillary: 249 mg/dL — ABNORMAL HIGH (ref 70–99)
Glucose-Capillary: 260 mg/dL — ABNORMAL HIGH (ref 70–99)

## 2020-11-15 LAB — FERRITIN: Ferritin: 1496 ng/mL — ABNORMAL HIGH (ref 24–336)

## 2020-11-15 LAB — C-REACTIVE PROTEIN: CRP: 15 mg/dL — ABNORMAL HIGH (ref <1.0)

## 2020-11-15 LAB — CULTURE, BLOOD (ROUTINE X 2)
Culture: NO GROWTH
Culture: NO GROWTH
Special Requests: ADEQUATE
Special Requests: ADEQUATE

## 2020-11-15 LAB — MAGNESIUM
Magnesium: 4.2 mg/dL — ABNORMAL HIGH (ref 1.7–2.4)
Magnesium: 4.2 mg/dL — ABNORMAL HIGH (ref 1.7–2.4)
Magnesium: 4.3 mg/dL — ABNORMAL HIGH (ref 1.7–2.4)

## 2020-11-15 LAB — D-DIMER, QUANTITATIVE: D-Dimer, Quant: 7.76 ug/mL-FEU — ABNORMAL HIGH (ref 0.00–0.50)

## 2020-11-15 MED ORDER — OSMOLITE 1.5 CAL PO LIQD: 1000.0000 mL | ORAL | Status: DC

## 2020-11-15 MED ORDER — INSULIN GLARGINE 100 UNIT/ML ~~LOC~~ SOLN
10.0000 [IU] | Freq: Every day | SUBCUTANEOUS | Status: DC
  Administered 2020-11-15: 10 [IU] via SUBCUTANEOUS
  Filled 2020-11-15 (×2): qty 0.1

## 2020-11-15 MED ORDER — DEXTROSE 5 % IV SOLN: INTRAVENOUS | Status: DC

## 2020-11-15 MED ORDER — METOPROLOL TARTRATE 5 MG/5ML IV SOLN
5.0000 mg | Freq: Four times a day (QID) | INTRAVENOUS | Status: DC
  Administered 2020-11-16 – 2020-11-17 (×6): 5 mg via INTRAVENOUS
  Filled 2020-11-15 (×6): qty 5

## 2020-11-15 MED ORDER — PROSOURCE TF PO LIQD
45.0000 mL | Freq: Three times a day (TID) | ORAL | Status: DC
  Administered 2020-11-16 – 2020-11-18 (×9): 45 mL
  Filled 2020-11-15 (×8): qty 45

## 2020-11-15 MED ORDER — VITAL HIGH PROTEIN PO LIQD: 1000.0000 mL | ORAL | Status: DC

## 2020-11-15 MED ORDER — PROSOURCE TF PO LIQD: 45.0000 mL | Freq: Two times a day (BID) | ORAL | Status: DC

## 2020-11-15 NOTE — Progress Notes (Signed)
0900 - Bedside RN attempted bedside swallow with pt. Pt began coughing after small sip of water.  1000 - Attempted bedside swallow again with small sips of water.  Once again, pt began coughing. Pt stated that it was difficult to swallow.

## 2020-11-15 NOTE — Therapy (Signed)
On arrival pt saturation was in the low 80's on HHFNC with NRB see flowsheet for setting pt was placed back on NIV tolerating well. Will continue to monitor.

## 2020-11-15 NOTE — Progress Notes (Signed)
Pt taken off bipap and placed on HHFNC and NRB at this time as he is more awake than previous. Pt placed on 40L/90% HHFNC & 15L NRB RT and RN will titrate as tolerated.

## 2020-11-15 NOTE — Plan of Care (Signed)
  Problem: Pain Managment: Goal: General experience of comfort will improve Outcome: Progressing   

## 2020-11-15 NOTE — Progress Notes (Signed)
Initial Nutrition Assessment  DOCUMENTATION CODES:   Obesity unspecified  INTERVENTION:   Plan for Cortrak placement tomorrow, 11/24  Tube Feeding via Cortrak:  Vital 1.5 at 60 ml/hr Pro-Source 45 mL TID Provides 2280 kcals, 130 g of protein and 1094 mL of free water  Recommend free water flushes once access obtained secondary to hypernatremia   NUTRITION DIAGNOSIS:   Inadequate oral intake related to acute illness as evidenced by NPO status.  GOAL:   Patient will meet greater than or equal to 90% of their needs  MONITOR:   Labs, Weight trends, TF tolerance, Vent status, Diet advancement  REASON FOR ASSESSMENT:   Consult Enteral/tube feeding initiation and management  ASSESSMENT:   64 yo male admitted with COVID pneumonia PMH includes HTN, non-hodgkins lymphoma, CVA, PAD s/p L AKA, HTN, TBI  11/18 Admit 11/20 Tx to ICU  Pt is currently on heated HFNC, requiring precedex drip. NPO  Noted plan for Cortrak; service available again tomorrow  Current wt 109.4 kg; admit weight 111.9 kg  Unable to obtain diet and weight history from patient at this time  Recorded po intake 85% on 11/19 at breakfast, no othe recorded po intake. Pt NPO since 11/20  Labs:  Sodium 152 (H), Creatinine wdl, CBGs 201-277 (ICU goal 140-180) Meds: ss novolog, lantus, solumedrol, D5 at 40 ml/hr   Diet Order:   Diet Order            Diet NPO time specified  Diet effective now                 EDUCATION NEEDS:   Not appropriate for education at this time  Skin:  Skin Assessment: Skin Integrity Issues: Skin Integrity Issues:: DTI DTI: nose  Last BM:  11/19  Height:   Ht Readings from Last 1 Encounters:  11/12/20 5\' 5"  (1.651 m)    Weight:   Wt Readings from Last 1 Encounters:  11/13/20 109.4 kg    BMI:  Body mass index is 40.13 kg/m.  Estimated Nutritional Needs:   Kcal:  2180-2505 kcals  Protein:  115-140 g  Fluid:  >/= 2 L  Kerman Passey MS, RDN, LDN,  CNSC Registered Dietitian III Clinical Nutrition RD Pager and On-Call Pager Number Located in [place]

## 2020-11-15 NOTE — Progress Notes (Addendum)
NAME:  Michael Singleton, MRN:  [id number], DOB:  Jul 17, 1956, LOS: 5 ADMISSION DATE:  11/09/2020, CONSULTATION DATE:  11/20 REFERRING MD:  Graciella Freer, CHIEF COMPLAINT:  Respiratory failure    Brief History   64 y/o, un-vaccinated, male with hx HTN, non-hodgkins lymphoma, CVA, CAD, PAD s/p L AKA, HTN initially admitted 11/18 with COVID PNA.  He was treated with baricitinib, remdesivir and dexamethasone. On 11/20 he had progressive dyspnea and hypoxia requiring 50L heated HFNC plus NRB with sats still 70's.  PCCM consulted for ICU tx.   Past Medical History  Anxiety  Chronic Back Pain  TBI Depression  PTSD  Diffuse Large B Cell Lymphoma  Diverticulosis  GERD HTN HLD  CAD s/p MI  OSA  Hypothyroidism  Vitamin D Deficiency   Significant Hospital Events   11/18 Admit  11/20 Tx to ICU, PCCM consulted  11/22 Wore bipap most of day, less alert / on precedex 11/23 On heated high flow O2, more alert, remains on precedex   Consults:    Procedures:    Significant Diagnostic Tests:  CTA chest 11/18 >> No evidence of pulmonary embolism to the lobar branch level. Extensive ground-glass consolidation throughout the bilateral lung fields, consistent with multifocal pneumonia in the setting of known COVID-19 infection.  Micro Data:  COVID 11/18 >> Positive  Influenza A/B 11/18 >> negative  BCx2 11/18 >> negative   Antimicrobials:    Interim history/subjective:  RT reports pt transitioned off bipap to [facility] RN notes patient was agitated overnight, attempted to get out of bed > ? If related to hypoxia (ABG with pO2 of 37)  Objective   Blood pressure (!) 143/68, pulse 60, temperature 97.6 F (36.4 C), temperature source Axillary, resp. rate (!) 27, height 5\' 5"  (1.651 m), weight 109.4 kg, SpO2 94 %.    Vent Mode: BIPAP FiO2 (%):  [80 %-90 %] 90 % Set Rate:  [14 bmp] 14 bmp PEEP:  [10 cmH20] 10 cmH20   Intake/Output Summary (Last 24 hours) at 11/15/2020 0904 Last data filed at  11/15/2020 0636 Gross per 24 hour  Intake 429.67 ml  Output 1750 ml  Net -1320.33 ml   Filed Weights   11/15/2020 0853 11/12/20 0228 11/13/20 0451  Weight: 111.9 kg 111 kg 109.4 kg   Physical Exam: General:adult male lying in bed in NAD  HEENT: MM pink/dry, HHFNC in place + NRB, pink area on bridge of nose from bipap mask Neuro: awake, alert to self, mild delirium but redirectable, MAE  CV: s1s2 RRR, no m/r/g PULM: non-labored on HHFNC, lungs bilaterally diminished but clear  GI: soft, bsx4 active  Extremities: warm/dry, no edema, L AKA  Skin: mild pink pressure area on bridge of nose   Resolved Hospital Problem list     Assessment & Plan:   Acute hypoxic respiratory failure secondary to COVID PNA  -cycle on / off BiPAP PRN  -heated high flow [place] for as tolerated for sats >85% -resume barcitinib when able to take PO's > missed doses due to bipap  -continue remdesivir  -solumedrol 60 mg IV BID  -follow inflammatory markers  -intermittent CXR  -remains high risk for intubation, patient himself said he did not want on 11/22.   Hx HTN  Hx VT (s/p ablation)  -change metoprolol to 5mg  IV Q6  -hold home XL lopressor dosing  -tele monitoring   Hx dCHF  Hx CAD  -Plavix, Statin, ASA   Hypernatremia  -add D5w at 72ml/hr  -consider free water if/when  able to take PO's  Hyperglycemia  -SSI, resistant scale  -increase lantus to 10 units QD  At Risk Malnutrition  -pt unable to take PO's -place cortrak for nutrition   Mild Elevation of LFT's  -monitor while on remdesivir   Hx PTSD  -continue home trazodone, wellbutrin, duloxetine if patient can take PO's -hold home gabapentin, amitriptyline   Best practice:  Diet: NPO Pain/Anxiety/Delirium protocol (if indicated): precedex VAP protocol (if indicated): n/a DVT prophylaxis: lovenox GI prophylaxis: n/a Glucose control: SSI Mobility: BR, turn Code Status: DNR / DNI Family Communication: Wife updated via phone 11/23.   She indicates she thought about [name]'s care and his prior stated wishes regarding intubation / end of life care.  She notes he would not want intubation or CPR and wants to honor his prior stated wishes. She wants to continue all other supportive measures with hopes of recovery.  Disposition: ICU   Labs   CBC: Recent Labs  Lab 11/11/20 0453 11/11/20 0453 11/12/20 0105 11/13/20 8916 11/14/20 0500 11/14/20 0520 11/15/20 0622  WBC 5.7  --  9.2 9.2 13.1*  --  11.5*  NEUTROABS 4.8  --  7.4 7.1 12.6*  --  9.4*  HGB 13.8   < > 14.4 13.1 14.7 13.6 13.9  HCT 40.3   < > 43.1 40.2 45.7 40.0 43.9  MCV 86.5  --  87.6 89.3 91.2  --  94.4  PLT 202  --  235 243 260  --  226   < > = values in this interval not displayed.    Basic Metabolic Panel: Recent Labs  Lab 11/11/20 0453 11/11/20 0453 11/12/20 0105 11/13/20 0633 11/14/20 0500 11/14/20 0520 11/15/20 0622  NA 134*   < > 135 139 145 145 152*  K 4.2   < > 4.4 4.7 4.9 4.5 4.6  CL 100  --  97* 103 108  --  112*  CO2 22  --  24 27 23   --  28  GLUCOSE 166*  --  156* 258* 217*  --  241*  BUN 22  --  29* 49* 45*  --  34*  CREATININE 0.86  --  0.95 1.18 1.17  --  0.98  CALCIUM 7.8*  --  8.4* 7.9* 8.2*  --  8.1*  MG 2.9*  --  3.0* 3.6* 4.0*  --  4.2*   < > = values in this interval not displayed.   GFR: Estimated Creatinine Clearance: 85.8 mL/min (by C-G formula based on SCr of 0.98 mg/dL). Recent Labs  Lab 11/03/2020 0755 10/28/2020 0755 11/11/2020 1000 11/11/20 0453 11/11/20 0453 11/12/20 0105 11/13/20 0633 11/14/20 0500 11/15/20 0622  PROCALCITON 1.59  --   --  0.97  --  0.74  --   --   --   WBC 3.9*   < >  --  5.7   < > 9.2 9.2 13.1* 11.5*  LATICACIDVEN 1.9  --  1.6  --   --   --   --   --   --    < > = values in this interval not displayed.    Liver Function Tests: Recent Labs  Lab 11/11/20 0453 11/12/20 0105 11/13/20 0633 11/14/20 0500 11/15/20 0622  AST 128* 135* 104* 131* 99*  ALT 54* 51* 45* 57* 65*  ALKPHOS 137*  132* 136* 163* 151*  BILITOT 0.8 0.7 1.0 1.4* 1.0  PROT 6.8 6.9 6.3* 6.7 6.5  ALBUMIN 2.9* 3.1* 2.6* 3.0* 2.7*   No  results for input(s): LIPASE, AMYLASE in the last 168 hours. No results for input(s): AMMONIA in the last 168 hours.  ABG    Component Value Date/Time   PHART 7.475 (H) 11/14/2020 0520   PCO2ART 39.1 11/14/2020 0520   PO2ART 37 (LL) 11/14/2020 0520   HCO3 28.8 (H) 11/14/2020 0520   TCO2 30 11/14/2020 0520   ACIDBASEDEF 1.0 10/28/2020 0818   O2SAT 75.0 11/14/2020 0520     Coagulation Profile: No results for input(s): INR, PROTIME in the last 168 hours.  Cardiac Enzymes: No results for input(s): CKTOTAL, CKMB, CKMBINDEX, TROPONINI in the last 168 hours.  HbA1C: Hgb A1c MFr Bld  Date/Time Value Ref Range Status  06/14/2017 09:06 PM 5.7 (H) 4.8 - 5.6 % Final    Comment:    (NOTE)         Pre-diabetes: 5.7 - 6.4         Diabetes: >6.4         Glycemic control for adults with diabetes: <7.0     CBG: Recent Labs  Lab 11/14/20 1609 11/14/20 1947 11/14/20 2359 11/15/20 0336 11/15/20 0806  GLUCAP 277* 276* 240* 230* 205*   Critical care time: 32 minutes      [name], MSN, NP-C, AGACNP-BC [facility] 11/15/2020, 9:04 AM   Please see [email or web address] for pager details.    64 year old gentleman past medical history of non-Hodgkin's lymphoma, CVA, CAD, PAD, status post left AKA, hypertension unvaccinated for COVID-19 developed COVID-19 pneumonia status post treatments with antivirals and remdesivir, plus dexamethasone.  Transferred to the ICU on NIPPV for a short period of time.  Now stable on high flow.  Long discussion with patient's wife as well as patient regarding CODE STATUS.  Patient has elected to be a DNR/DNI.  BP (!) 166/63   Pulse 60   Temp (!) 96.6 F (35.9 C) (Axillary)   Resp (!) 21   Ht 5\' 5"  (1.651 m)   Wt 109.4 kg   SpO2 94%   BMI 40.13 kg/m   General: Elderly chronically ill-appearing male obese on high  flow nasal cannula HEENT: Tracking appropriately Heart: Regular rhythm S1-S2 Lungs: Bilateral breath sounds, no crackles no wheeze Abdomen: Soft nontender nondistended Extremities: Left AKA  Labs: Reviewed  Assessment: Acute hypoxemic respiratory failure secondary to ARDS from COVID-19 pneumonia. DNR/DNI Hypertension History of VT Chronic diastolic heart failure CAD Hypernatremia from mechanical support and free water loss. Hyperglycemia PTSD  Plan: Goals of care discussions with patient's family today.  And patient. Decision made for DNR/DNI. Continue steroids Wean FiO2 as tolerated Remain in ICU due to ongoing bouts of delirium which seems to occur mostly in the evening. I suspect to get somewhat better as we taper his steroids. Continue reorientation as much as possible. Please see separate documentation by [name], NP discussing with patient's wife.  This patient is critically ill with multiple organ system failure; which, requires frequent high complexity decision making, assessment, support, evaluation, and titration of therapies. This was completed through the application of advanced monitoring technologies and extensive interpretation of multiple databases. During this encounter critical care time was devoted to patient care services described in this note for 33 minutes.  Garner Nash, DO [facility] 11/15/2020 3:42 PM

## 2020-11-15 NOTE — Plan of Care (Signed)
  Problem: Clinical Measurements: Goal: Respiratory complications will improve Outcome: Progressing   Problem: Respiratory: Goal: Will maintain a patent airway Outcome: Progressing   Problem: Nutrition: Goal: Adequate nutrition will be maintained Outcome: Not Progressing   Problem: Coping: Goal: Level of anxiety will decrease Outcome: Not Progressing

## 2020-11-16 LAB — CBC
HCT: 43 % (ref 39.0–52.0)
Hemoglobin: 13.2 g/dL (ref 13.0–17.0)
MCH: 29.1 pg (ref 26.0–34.0)
MCHC: 30.7 g/dL (ref 30.0–36.0)
MCV: 94.9 fL (ref 80.0–100.0)
Platelets: 205 10*3/uL (ref 150–400)
RBC: 4.53 MIL/uL (ref 4.22–5.81)
RDW: 14.5 % (ref 11.5–15.5)
WBC: 10.7 10*3/uL — ABNORMAL HIGH (ref 4.0–10.5)
nRBC: 0.5 % — ABNORMAL HIGH (ref 0.0–0.2)

## 2020-11-16 LAB — BASIC METABOLIC PANEL
Anion gap: 11 (ref 5–15)
BUN: 29 mg/dL — ABNORMAL HIGH (ref 8–23)
CO2: 27 mmol/L (ref 22–32)
Calcium: 7.8 mg/dL — ABNORMAL LOW (ref 8.9–10.3)
Chloride: 114 mmol/L — ABNORMAL HIGH (ref 98–111)
Creatinine, Ser: 1.01 mg/dL (ref 0.61–1.24)
GFR, Estimated: >60 mL/min (ref >60)
Glucose, Bld: 304 mg/dL — ABNORMAL HIGH (ref 70–99)
Potassium: 4.8 mmol/L (ref 3.5–5.1)
Sodium: 152 mmol/L — ABNORMAL HIGH (ref 135–145)

## 2020-11-16 LAB — MAGNESIUM
Magnesium: 4.1 mg/dL — ABNORMAL HIGH (ref 1.7–2.4)
Magnesium: 4.2 mg/dL — ABNORMAL HIGH (ref 1.7–2.4)

## 2020-11-16 LAB — GLUCOSE, CAPILLARY
Glucose-Capillary: 200 mg/dL — ABNORMAL HIGH (ref 70–99)
Glucose-Capillary: 205 mg/dL — ABNORMAL HIGH (ref 70–99)
Glucose-Capillary: 223 mg/dL — ABNORMAL HIGH (ref 70–99)
Glucose-Capillary: 237 mg/dL — ABNORMAL HIGH (ref 70–99)
Glucose-Capillary: 243 mg/dL — ABNORMAL HIGH (ref 70–99)
Glucose-Capillary: 264 mg/dL — ABNORMAL HIGH (ref 70–99)

## 2020-11-16 LAB — PHOSPHORUS
Phosphorus: 4.5 mg/dL (ref 2.5–4.6)
Phosphorus: 4.8 mg/dL — ABNORMAL HIGH (ref 2.5–4.6)

## 2020-11-16 MED ORDER — VITAL 1.5 CAL PO LIQD
1000.0000 mL | ORAL | Status: DC
  Administered 2020-11-16 – 2020-11-19 (×2): 1000 mL
  Filled 2020-11-16 (×4): qty 1000

## 2020-11-16 MED ORDER — ASPIRIN 81 MG PO CHEW
81.0000 mg | CHEWABLE_TABLET | Freq: Every day | ORAL | Status: DC
  Administered 2020-11-17 – 2020-11-18 (×2): 81 mg
  Filled 2020-11-16 (×2): qty 1

## 2020-11-16 MED ORDER — BARICITINIB 1 MG PO TABS
4.0000 mg | ORAL_TABLET | Freq: Every day | ORAL | Status: DC
  Administered 2020-11-17 – 2020-11-18 (×2): 4 mg
  Filled 2020-11-16 (×2): qty 4

## 2020-11-16 MED ORDER — ATORVASTATIN CALCIUM 80 MG PO TABS
80.0000 mg | ORAL_TABLET | Freq: Every day | ORAL | Status: DC
  Administered 2020-11-16 – 2020-11-18 (×3): 80 mg
  Filled 2020-11-16 (×3): qty 1

## 2020-11-16 MED ORDER — CLOPIDOGREL BISULFATE 75 MG PO TABS
75.0000 mg | ORAL_TABLET | Freq: Every day | ORAL | Status: DC
  Administered 2020-11-17 – 2020-11-19 (×3): 75 mg
  Filled 2020-11-16 (×3): qty 1

## 2020-11-16 MED ORDER — INSULIN GLARGINE 100 UNIT/ML ~~LOC~~ SOLN
20.0000 [IU] | Freq: Every day | SUBCUTANEOUS | Status: DC
  Administered 2020-11-16 – 2020-11-17 (×2): 20 [IU] via SUBCUTANEOUS
  Filled 2020-11-16 (×2): qty 0.2

## 2020-11-16 MED ORDER — SENNA 8.6 MG PO TABS
1.0000 | ORAL_TABLET | Freq: Two times a day (BID) | ORAL | Status: DC
  Administered 2020-11-16 – 2020-11-18 (×4): 8.6 mg
  Filled 2020-11-16 (×4): qty 1

## 2020-11-16 MED ORDER — TRAZODONE HCL 50 MG PO TABS
200.0000 mg | ORAL_TABLET | Freq: Every day | ORAL | Status: DC
  Administered 2020-11-16 – 2020-11-18 (×3): 200 mg
  Filled 2020-11-16 (×3): qty 4

## 2020-11-16 MED ORDER — INSULIN GLARGINE 100 UNIT/ML ~~LOC~~ SOLN: 20.0000 [IU] | Freq: Every day | SUBCUTANEOUS | Status: DC

## 2020-11-16 MED ORDER — INSULIN ASPART 100 UNIT/ML ~~LOC~~ SOLN
4.0000 [IU] | Freq: Three times a day (TID) | SUBCUTANEOUS | Status: DC
  Administered 2020-11-16 – 2020-11-17 (×2): 4 [IU] via SUBCUTANEOUS

## 2020-11-16 MED ORDER — LEVOTHYROXINE SODIUM 100 MCG PO TABS
200.0000 ug | ORAL_TABLET | Freq: Every day | ORAL | Status: DC
  Administered 2020-11-17 – 2020-11-19 (×3): 200 ug
  Filled 2020-11-16 (×3): qty 2

## 2020-11-16 MED ORDER — METHYLPREDNISOLONE SODIUM SUCC 40 MG IJ SOLR
40.0000 mg | Freq: Two times a day (BID) | INTRAMUSCULAR | Status: AC
  Administered 2020-11-17 – 2020-11-18 (×4): 40 mg via INTRAVENOUS
  Filled 2020-11-16 (×4): qty 1

## 2020-11-16 NOTE — Progress Notes (Signed)
Inpatient Diabetes Program Recommendations  AACE/ADA: New Consensus Statement on Inpatient Glycemic Control (2015)  Target Ranges:  Prepandial:   less than 140 mg/dL      Peak postprandial:   less than 180 mg/dL (1-2 hours)      Critically ill patients:  140 - 180 mg/dL   Lab Results  Component Value Date   GLUCAP 243 (H) 11/16/2020   HGBA1C 5.7 (H) 06/14/2017    Review of Glycemic Control Results for ISSACC, MERLO (MRN [id number]) as of 11/16/2020 12:31  Ref. Range 11/15/2020 20:09 11/16/2020 00:02 11/16/2020 03:27 11/16/2020 08:44 11/16/2020 11:20  Glucose-Capillary Latest Ref Range: 70 - 99 mg/dL 222 (H) 223 (H) 205 (H) 264 (H) 243 (H)   Diabetes history: None Outpatient Diabetes medications: None Current orders for Inpatient glycemic control:  Lantus 20 units daily, Novolog resistant q 4 hours, Solumedrol 40 mg IV q 12 hours Inpatient Diabetes Program Recommendations:   Note tube feeds starting today.  Please consider adding Novolog 3 units q 4 hours to cover CHO in tube feeds.   Thanks  Adah Perl, RN, BC-ADM Inpatient Diabetes Coordinator Pager [phone] (8a-5p)

## 2020-11-16 NOTE — Procedures (Signed)
Cortrak  Person Inserting Tube:  Deserea Bordley, Creola Corn, RD Tube Type:  Cortrak - 43 inches Tube Location:  Left nare Initial Placement:  Stomach Secured by: Bridle Technique Used to Measure Tube Placement:  Documented cm marking at nare/ corner of mouth Cortrak Secured At:  75 cm    Cortrak Tube Team Note:  Consult received to place a Cortrak feeding tube.   No x-ray is required. RN may begin using tube.     If the tube becomes dislodged please keep the tube and contact the Cortrak team at [email or web address] ([email or web address]) for replacement.  If after hours and replacement cannot be delayed, place a NG tube and confirm placement with an abdominal x-ray.    Larkin Ina, MS, RD, LDN RD pager number and weekend/on-call pager number located in [place].

## 2020-11-16 NOTE — Progress Notes (Addendum)
NAME:  Michael Singleton, MRN:  [id number], DOB:  Mar 11, 1956, LOS: 6 ADMISSION DATE:  11/13/2020, CONSULTATION DATE:  11/20 REFERRING MD:  Graciella Freer, CHIEF COMPLAINT:  Respiratory failure    Brief History   64 y/o, un-vaccinated, male with hx HTN, non-hodgkins lymphoma, CVA, CAD, PAD s/p L AKA, HTN initially admitted 11/18 with COVID PNA.  He was treated with baricitinib, remdesivir and dexamethasone. On 11/20 he had progressive dyspnea and hypoxia requiring 50L heated HFNC plus NRB with sats still 70's.  PCCM consulted for ICU tx.   Past Medical History  Anxiety  Chronic Back Pain  TBI Depression  PTSD  Diffuse Large B Cell Lymphoma  Diverticulosis  GERD HTN HLD  CAD s/p MI  OSA  Hypothyroidism  Vitamin D Deficiency   Significant Hospital Events   11/18 Admit  11/20 Tx to ICU, PCCM consulted  11/22 Wore bipap most of day, less alert / on precedex 11/23 On heated high flow O2, more alert, remains on precedex  11/24 Remains on precedex, confused, intermittent BiPAP   Consults:    Procedures:    Significant Diagnostic Tests:  CTA chest 11/18 >> No evidence of pulmonary embolism to the lobar branch level. Extensive ground-glass consolidation throughout the bilateral lung fields, consistent with multifocal pneumonia in the setting of known COVID-19 infection.  Micro Data:  COVID 11/18 >> Positive  Influenza A/B 11/18 >> negative  BCx2 11/18 >> negative   Antimicrobials:    Interim history/subjective:  RN reports pt remains on BiPAP Afebrile  Remains on precedex, mild delirium  Glucose range 205-304  Objective   Blood pressure 126/63, pulse 60, temperature 98.9 F (37.2 C), temperature source Axillary, resp. rate 20, height 5\' 5"  (1.651 m), weight 104.5 kg, SpO2 94 %.    Vent Mode: BIPAP;PCV FiO2 (%):  [75 %-80 %] 75 % Set Rate:  [14 bmp] 14 bmp PEEP:  [10 cmH20] 10 cmH20 Plateau Pressure:  [13 cmH20] 13 cmH20   Intake/Output Summary (Last 24 hours) at 11/16/2020  1144 Last data filed at 11/16/2020 0600 Gross per 24 hour  Intake 1047.89 ml  Output 950 ml  Net 97.89 ml   Filed Weights   11/12/20 0228 11/13/20 0451 11/16/20 0322  Weight: 111 kg 109.4 kg 104.5 kg   Physical Exam: General: adult male lying in bed in NAD on BiPAP HEENT: MM pink/moist, dressing on bridge of nose, anicteric  Neuro: Awakens to voice, nods yes / no, MAE CV: s1s2 RRR, no m/r/g PULM: non-labored on BiPAP, lungs bilaterally diminished but clear  GI: soft, bsx4 active  Extremities: warm/dry, no edema  Skin: no rashes or lesions  Resolved Hospital Problem list     Assessment & Plan:   Acute hypoxic respiratory failure secondary to COVID PNA  Completed Remdesivir  -cycle on / off BiPAP PRN  -heated high flow as tolerated -saturation goal >85% -reduce solumedrol 40 mg IV BID  -baricitinib per pharmacy  -follow inflammatory markers  -DNR / DNI   Agitated Delirium  -reduce steroids as above  -promote sleep / wake cycle  -delirium prevention measures  Hx HTN  Hx VT (s/p ablation)  -continue metoprolol 5mg  IV Q6  -hold home XL lopressor  -tele monitoring   Hx dCHF  Hx CAD  -plavix, statin, ASA   Hypernatremia  -D5w at 40 ml/hr  -add free water when able to take PO's   Hyperglycemia  -SSI, resistant scale  -increase lantus to 20 units QD   At Risk Malnutrition  -  pending cortrak for nutrition   Mild Elevation of LFT's  -monitor trend   Hx PTSD  -continue trazodone, wellbutrin, duloxetine, cymbalta -hold home gabapentin, amitriptyline    Best practice:  Diet: NPO Pain/Anxiety/Delirium protocol (if indicated): precedex VAP protocol (if indicated): n/a DVT prophylaxis: lovenox GI prophylaxis: n/a Glucose control: SSI Mobility: BR, turn Code Status: DNR / DNI Family Communication: Wife updated via phone 11/24.   Disposition: ICU   Labs   CBC: Recent Labs  Lab 11/11/20 0453 11/11/20 0453 11/12/20 0105 11/12/20 0105 11/13/20 0017  11/14/20 0500 11/14/20 0520 11/15/20 0622 11/16/20 0709  WBC 5.7   < > 9.2  --  9.2 13.1*  --  11.5* 10.7*  NEUTROABS 4.8  --  7.4  --  7.1 12.6*  --  9.4*  --   HGB 13.8   < > 14.4   < > 13.1 14.7 13.6 13.9 13.2  HCT 40.3   < > 43.1   < > 40.2 45.7 40.0 43.9 43.0  MCV 86.5   < > 87.6  --  89.3 91.2  --  94.4 94.9  PLT 202   < > 235  --  243 260  --  226 205   < > = values in this interval not displayed.    Basic Metabolic Panel: Recent Labs  Lab 11/12/20 0105 11/12/20 0105 11/13/20 4944 11/13/20 9675 11/14/20 0500 11/14/20 0520 11/15/20 0622 11/15/20 1208 11/15/20 1658 11/16/20 0709  NA 135   < > 139  --  145 145 152*  --   --  152*  K 4.4   < > 4.7  --  4.9 4.5 4.6  --   --  4.8  CL 97*  --  103  --  108  --  112*  --   --  114*  CO2 24  --  27  --  23  --  28  --   --  27  GLUCOSE 156*  --  258*  --  217*  --  241*  --   --  304*  BUN 29*  --  49*  --  45*  --  34*  --   --  29*  CREATININE 0.95  --  1.18  --  1.17  --  0.98  --   --  1.01  CALCIUM 8.4*  --  7.9*  --  8.2*  --  8.1*  --   --  7.8*  MG 3.0*   < > 3.6*   < > 4.0*  --  4.2* 4.2* 4.3* 4.1*  PHOS  --   --   --   --   --   --   --  4.0 4.5 4.5   < > = values in this interval not displayed.   GFR: Estimated Creatinine Clearance: 81.2 mL/min (by C-G formula based on SCr of 1.01 mg/dL). Recent Labs  Lab 11/09/2020 0755 11/13/2020 0755 10/29/2020 1000 11/11/20 0453 11/11/20 0453 11/12/20 0105 11/12/20 0105 11/13/20 0633 11/14/20 0500 11/15/20 0622 11/16/20 0709  PROCALCITON 1.59  --   --  0.97  --  0.74  --   --   --   --   --   WBC 3.9*   < >  --  5.7   < > 9.2   < > 9.2 13.1* 11.5* 10.7*  LATICACIDVEN 1.9  --  1.6  --   --   --   --   --   --   --   --    < > =  values in this interval not displayed.    Liver Function Tests: Recent Labs  Lab 11/11/20 0453 11/12/20 0105 11/13/20 0633 11/14/20 0500 11/15/20 0622  AST 128* 135* 104* 131* 99*  ALT 54* 51* 45* 57* 65*  ALKPHOS 137* 132* 136* 163*  151*  BILITOT 0.8 0.7 1.0 1.4* 1.0  PROT 6.8 6.9 6.3* 6.7 6.5  ALBUMIN 2.9* 3.1* 2.6* 3.0* 2.7*   No results for input(s): LIPASE, AMYLASE in the last 168 hours. No results for input(s): AMMONIA in the last 168 hours.  ABG    Component Value Date/Time   PHART 7.475 (H) 11/14/2020 0520   PCO2ART 39.1 11/14/2020 0520   PO2ART 37 (LL) 11/14/2020 0520   HCO3 28.8 (H) 11/14/2020 0520   TCO2 30 11/14/2020 0520   ACIDBASEDEF 1.0 11/07/2020 0818   O2SAT 75.0 11/14/2020 0520     Coagulation Profile: No results for input(s): INR, PROTIME in the last 168 hours.  Cardiac Enzymes: No results for input(s): CKTOTAL, CKMB, CKMBINDEX, TROPONINI in the last 168 hours.  HbA1C: Hgb A1c MFr Bld  Date/Time Value Ref Range Status  06/14/2017 09:06 PM 5.7 (H) 4.8 - 5.6 % Final    Comment:    (NOTE)         Pre-diabetes: 5.7 - 6.4         Diabetes: >6.4         Glycemic control for adults with diabetes: <7.0     CBG: Recent Labs  Lab 11/15/20 2009 11/16/20 0002 11/16/20 0327 11/16/20 0844 11/16/20 1120  GLUCAP 222* 223* 205* 264* 243*   Critical care time: 32 minutes      [name], MSN, NP-C, AGACNP-BC [facility] 11/16/2020, 11:44 AM   Please see [email or web address] for pager details.    PCCM:  64 yo, unvaccinated, COVID PNA, h.o lymphoma, CVA, CAD, PAD, s/p LAKA, HTN.   Now DNR, still delirious, on an off BIPAP  BP 126/63   Pulse 60   Temp 98.9 F (37.2 C) (Axillary)   Resp 20   Ht 5\' 5"  (1.651 m)   Wt 104.5 kg   SpO2 94%   BMI 38.34 kg/m   Gen: obese male, resting in bed HENT: NCAT, sclera clear, tracking  NEURO: follows commands, on precedex  HEART: RRR, s1 s2 LUNGS: BL Vented breaths   Labs reviewed   A:  AHRF COVID19 PNA  NIPPV  Acute ICU delirium  Acute metabolic encephalopathy  HTN CAD  Chronic Diastolic heart failure   P: Complete remdesivir course  Reducing solumedrol  Continue PRN BIPAP cortrak for TF  Wean off  precedex  Wife was updated   This patient is critically ill with multiple organ system failure; which, requires frequent high complexity decision making, assessment, support, evaluation, and titration of therapies. This was completed through the application of advanced monitoring technologies and extensive interpretation of multiple databases. During this encounter critical care time was devoted to patient care services described in this note for 31 minutes.  Garner Nash, DO [facility] 11/16/2020 1:58 PM

## 2020-11-17 ENCOUNTER — Inpatient Hospital Stay (HOSPITAL_COMMUNITY): Payer: No Typology Code available for payment source

## 2020-11-17 DIAGNOSIS — Z515 Encounter for palliative care: Secondary | ICD-10-CM | POA: Diagnosis not present

## 2020-11-17 DIAGNOSIS — J9601 Acute respiratory failure with hypoxia: Secondary | ICD-10-CM | POA: Diagnosis present

## 2020-11-17 LAB — BASIC METABOLIC PANEL
Anion gap: 12 (ref 5–15)
BUN: 31 mg/dL — ABNORMAL HIGH (ref 8–23)
CO2: 25 mmol/L (ref 22–32)
Calcium: 8.1 mg/dL — ABNORMAL LOW (ref 8.9–10.3)
Chloride: 120 mmol/L — ABNORMAL HIGH (ref 98–111)
Creatinine, Ser: 0.88 mg/dL (ref 0.61–1.24)
GFR, Estimated: >60 mL/min (ref >60)
Glucose, Bld: 309 mg/dL — ABNORMAL HIGH (ref 70–99)
Potassium: 4.5 mmol/L (ref 3.5–5.1)
Sodium: 157 mmol/L — ABNORMAL HIGH (ref 135–145)

## 2020-11-17 LAB — GLUCOSE, CAPILLARY
Glucose-Capillary: 264 mg/dL — ABNORMAL HIGH (ref 70–99)
Glucose-Capillary: 292 mg/dL — ABNORMAL HIGH (ref 70–99)
Glucose-Capillary: 313 mg/dL — ABNORMAL HIGH (ref 70–99)
Glucose-Capillary: 330 mg/dL — ABNORMAL HIGH (ref 70–99)
Glucose-Capillary: 341 mg/dL — ABNORMAL HIGH (ref 70–99)
Glucose-Capillary: 357 mg/dL — ABNORMAL HIGH (ref 70–99)
Glucose-Capillary: 359 mg/dL — ABNORMAL HIGH (ref 70–99)

## 2020-11-17 LAB — CBC
HCT: 46.7 % (ref 39.0–52.0)
Hemoglobin: 13.8 g/dL (ref 13.0–17.0)
MCH: 28.7 pg (ref 26.0–34.0)
MCHC: 29.6 g/dL — ABNORMAL LOW (ref 30.0–36.0)
MCV: 97.1 fL (ref 80.0–100.0)
Platelets: 196 10*3/uL (ref 150–400)
RBC: 4.81 MIL/uL (ref 4.22–5.81)
RDW: 14.6 % (ref 11.5–15.5)
WBC: 10.8 10*3/uL — ABNORMAL HIGH (ref 4.0–10.5)
nRBC: 0.2 % (ref 0.0–0.2)

## 2020-11-17 MED ORDER — FREE WATER
200.0000 mL | Status: DC
  Administered 2020-11-17 – 2020-11-19 (×12): 200 mL

## 2020-11-17 MED ORDER — HYDRALAZINE HCL 20 MG/ML IJ SOLN
10.0000 mg | Freq: Four times a day (QID) | INTRAMUSCULAR | Status: DC | PRN
  Administered 2020-11-17: 10 mg via INTRAVENOUS
  Filled 2020-11-17: qty 1

## 2020-11-17 MED ORDER — INSULIN ASPART 100 UNIT/ML ~~LOC~~ SOLN
4.0000 [IU] | Freq: Four times a day (QID) | SUBCUTANEOUS | Status: DC
  Administered 2020-11-17 – 2020-11-18 (×4): 4 [IU] via SUBCUTANEOUS

## 2020-11-17 MED ORDER — INSULIN GLARGINE 100 UNIT/ML ~~LOC~~ SOLN
30.0000 [IU] | Freq: Every day | SUBCUTANEOUS | Status: DC
  Administered 2020-11-18: 30 [IU] via SUBCUTANEOUS
  Filled 2020-11-17: qty 0.3

## 2020-11-17 MED ORDER — METOPROLOL TARTRATE 25 MG PO TABS
25.0000 mg | ORAL_TABLET | Freq: Two times a day (BID) | ORAL | Status: DC
  Administered 2020-11-17 – 2020-11-18 (×4): 25 mg
  Filled 2020-11-17 (×3): qty 1

## 2020-11-17 NOTE — Progress Notes (Signed)
Patient restless and repeatedly pulling off oxygen. Sats falling to low 60%.  Precedex restarted.

## 2020-11-17 NOTE — Progress Notes (Signed)
Assisted tele visit to patient with partner.  Mcarthur Rossetti, RN

## 2020-11-17 NOTE — Progress Notes (Addendum)
NAME:  Michael Singleton, MRN:  [id number], DOB:  December 19, 1956, LOS: 7 ADMISSION DATE:  11/04/2020, CONSULTATION DATE:  11/20 REFERRING MD:  Graciella Freer, CHIEF COMPLAINT:  Respiratory failure    Brief History   64 y/o, un-vaccinated, male with hx HTN, non-hodgkins lymphoma, CVA, CAD, PAD s/p L AKA, HTN initially admitted 11/18 with COVID PNA.  He was treated with baricitinib, remdesivir and dexamethasone. On 11/20 he had progressive dyspnea and hypoxia requiring 50L heated HFNC plus NRB with sats still 70's.  PCCM consulted for ICU tx.   Past Medical History  Anxiety  Chronic Back Pain  TBI Depression  PTSD  Diffuse Large B Cell Lymphoma  Diverticulosis  GERD HTN HLD  CAD s/p MI  OSA  Hypothyroidism  Vitamin D Deficiency   Significant Hospital Events   11/18 Admit  11/20 Tx to ICU, PCCM consulted  11/22 Wore bipap most of day, less alert / on precedex 11/23 On heated high flow O2, more alert, remains on precedex  11/24 Remains on precedex, confused, intermittent BiPAP   Consults:    Procedures:    Significant Diagnostic Tests:  CTA chest 11/18 >> No evidence of pulmonary embolism to the lobar branch level. Extensive ground-glass consolidation throughout the bilateral lung fields, consistent with multifocal pneumonia in the setting of known COVID-19 infection.  Micro Data:  COVID 11/18 >> Positive  Influenza A/B 11/18 >> negative  BCx2 11/18 >> negative   Antimicrobials:    Interim history/subjective:  RN reports pt delirium improved  Precedex turned off  Pt asking for ice, water  Afebrile  Wore bipap overnight, now on 35L flow / 100% FiO2 via HHFNC I/O 954ml UOP, +656ml in last 24 hours (negative balance overall) Glucose range 237-357  Objective   Blood pressure (!) 161/63, pulse 86, temperature 98.3 F (36.8 C), temperature source Oral, resp. rate (!) 24, height 5\' 5"  (1.651 m), weight 105.2 kg, SpO2 92 %.    Vent Mode: BIPAP;PCV FiO2 (%):  [75 %-100 %] 100 % Set  Rate:  [14 bmp] 14 bmp PEEP:  [10 cmH20] 10 cmH20   Intake/Output Summary (Last 24 hours) at 11/17/2020 1025 Last data filed at 11/17/2020 1000 Gross per 24 hour  Intake 1458.82 ml  Output 975 ml  Net 483.82 ml   Filed Weights   11/13/20 0451 11/16/20 0322 11/17/20 0352  Weight: 109.4 kg 104.5 kg 105.2 kg   Physical Exam: General: ill appearing adult male lying in bed in NAD HEENT: MM pink/moist, anicteric, dressing over bridge of nose  Neuro: Awake, alert, oriented to self, place / events, follows commands / non-focal exam  CV: s1s2 rrr, no m/r/g PULM:  Non-labored on [place] O2, lungs bilaterally clear  GI: soft, bsx4 active  Extremities: warm/dry, no edema, L AKA  Skin: no rashes or lesions on exposed skin other than pressure injury on bridge of nose   Resolved Hospital Problem list     Assessment & Plan:   Acute hypoxic respiratory failure secondary to COVID PNA  Completed Remdesivir  -cycle on / off bipap overnight  -HHFNC as tolerated during day  -saturation goal > 85% -solumedrol 40 mg IV BID  -baricitinib per pharmacy  -follow inflammatory markers  -DNR / DNI   Agitated Delirium  -continue supportive care -promote sleep / wake cycle  -delirium prevention measures   Hx HTN  Hx VT (s/p ablation)  -resume PT lopressor  -tele monitoring   Hx dCHF  Hx CAD  -plavix, ASA, statin  Hypernatremia  -add free water 200 ml Q4   Hyperglycemia  -SSI, resistant scale  -increase lantus 30 units QD   At Risk Malnutrition  -TF per nutrition    Mild Elevation of LFT's  -follow trend   Hx PTSD  -trazodone, wellbutrin, cymbalta, duloxetine  -hold home gabapentin, amitriptyline    Best practice:  Diet: NPO Pain/Anxiety/Delirium protocol (if indicated): n/a  VAP protocol (if indicated): n/a DVT prophylaxis: lovenox GI prophylaxis: n/a Glucose control: SSI Mobility: BR, turn Code Status: DNR / DNI Family Communication: Wife updated via phone 11/25 on plan  of care.   Disposition: Transfer to SDU, to [facility]   Labs   CBC: Recent Labs  Lab 11/11/20 0453 11/11/20 0453 11/12/20 0105 11/12/20 0105 11/13/20 4709 11/13/20 6283 11/14/20 0500 11/14/20 0520 11/15/20 0622 11/16/20 0709 11/17/20 0114  WBC 5.7   < > 9.2   < > 9.2  --  13.1*  --  11.5* 10.7* 10.8*  NEUTROABS 4.8  --  7.4  --  7.1  --  12.6*  --  9.4*  --   --   HGB 13.8   < > 14.4   < > 13.1   < > 14.7 13.6 13.9 13.2 13.8  HCT 40.3   < > 43.1   < > 40.2   < > 45.7 40.0 43.9 43.0 46.7  MCV 86.5   < > 87.6   < > 89.3  --  91.2  --  94.4 94.9 97.1  PLT 202   < > 235   < > 243  --  260  --  226 205 196   < > = values in this interval not displayed.    Basic Metabolic Panel: Recent Labs  Lab 11/13/20 0633 11/13/20 0633 11/14/20 0500 11/14/20 0500 11/14/20 0520 11/15/20 0622 11/15/20 1208 11/15/20 1658 11/16/20 0709 11/16/20 1832 11/17/20 0114  NA 139   < > 145  --  145 152*  --   --  152*  --  157*  K 4.7   < > 4.9  --  4.5 4.6  --   --  4.8  --  4.5  CL 103  --  108  --   --  112*  --   --  114*  --  120*  CO2 27  --  23  --   --  28  --   --  27  --  25  GLUCOSE 258*  --  217*  --   --  241*  --   --  304*  --  309*  BUN 49*  --  45*  --   --  34*  --   --  29*  --  31*  CREATININE 1.18  --  1.17  --   --  0.98  --   --  1.01  --  0.88  CALCIUM 7.9*  --  8.2*  --   --  8.1*  --   --  7.8*  --  8.1*  MG 3.6*   < > 4.0*   < >  --  4.2* 4.2* 4.3* 4.1* 4.2*  --   PHOS  --   --   --   --   --   --  4.0 4.5 4.5 4.8*  --    < > = values in this interval not displayed.   GFR: Estimated Creatinine Clearance: 93.5 mL/min (by C-G formula based on SCr of 0.88 mg/dL). Recent  Labs  Lab 11/11/20 0453 11/11/20 0453 11/12/20 0105 11/13/20 0633 11/14/20 0500 11/15/20 0622 11/16/20 0709 11/17/20 0114  PROCALCITON 0.97  --  0.74  --   --   --   --   --   WBC 5.7   < > 9.2   < > 13.1* 11.5* 10.7* 10.8*   < > = values in this interval not displayed.    Liver Function  Tests: Recent Labs  Lab 11/11/20 0453 11/12/20 0105 11/13/20 0633 11/14/20 0500 11/15/20 0622  AST 128* 135* 104* 131* 99*  ALT 54* 51* 45* 57* 65*  ALKPHOS 137* 132* 136* 163* 151*  BILITOT 0.8 0.7 1.0 1.4* 1.0  PROT 6.8 6.9 6.3* 6.7 6.5  ALBUMIN 2.9* 3.1* 2.6* 3.0* 2.7*   No results for input(s): LIPASE, AMYLASE in the last 168 hours. No results for input(s): AMMONIA in the last 168 hours.  ABG    Component Value Date/Time   PHART 7.475 (H) 11/14/2020 0520   PCO2ART 39.1 11/14/2020 0520   PO2ART 37 (LL) 11/14/2020 0520   HCO3 28.8 (H) 11/14/2020 0520   TCO2 30 11/14/2020 0520   ACIDBASEDEF 1.0 11/12/2020 0818   O2SAT 75.0 11/14/2020 0520     Coagulation Profile: No results for input(s): INR, PROTIME in the last 168 hours.  Cardiac Enzymes: No results for input(s): CKTOTAL, CKMB, CKMBINDEX, TROPONINI in the last 168 hours.  HbA1C: Hgb A1c MFr Bld  Date/Time Value Ref Range Status  06/14/2017 09:06 PM 5.7 (H) 4.8 - 5.6 % Final    Comment:    (NOTE)         Pre-diabetes: 5.7 - 6.4         Diabetes: >6.4         Glycemic control for adults with diabetes: <7.0     CBG: Recent Labs  Lab 11/16/20 1540 11/16/20 1955 11/17/20 0000 11/17/20 0342 11/17/20 0841  GLUCAP 200* 237* 264* 330* 357*   Critical care time:       [name], MSN, NP-C, AGACNP-BC [facility] 11/17/2020, 10:25 AM   Please see [email or web address] for pager details.    PCCM Attending:   I agree with [name], NP documentation above.   64 yo M, unvaccinated covid 4, DNR, on and off BIPAP, now stable on HHFNC. No longer delirious. Off precedex.   BP (!) 161/63   Pulse 86   Temp 98.3 F (36.8 C) (Oral)   Resp (!) 24   Ht 5\' 5"  (1.651 m)   Wt 105.2 kg   SpO2 92%   BMI 38.59 kg/m   Gen: obese male, comfortable  Heart: RRR, s1 s2 Lungs: BL vented breaths  Abd: soft nt nd   Labs reviewed  A:  COVID19 PNA DNR  Delirium resolved  H/o HTN, CHF, CAD,  PAD  P: Stable for transfer from ICU Steroid taper PT OT  Advance diet as tolerated  Mobility as tolerated   Garner Nash, DO [facility] 11/17/2020 11:25 AM

## 2020-11-17 NOTE — Consult Note (Signed)
Consultation Note Date: 11/17/2020   Patient Name: Michael Singleton  DOB: 1956/06/25  MRN: [id number]  Age / Sex: 64 y.o., male  PCP: [facility] Referring Physician: Garner Nash, DO  Reason for Consultation: Establishing goals of care  HPI/Patient Profile: 64 y/o, un-vaccinated, male with hx HTN, non-hodgkins lymphoma, CVA, CAD, PAD s/p L AKA, HTN initially admitted 11/18 with COVID PNA.  He was treated with baricitinib, remdesivir and dexamethasone. On 11/20 he had progressive dyspnea and hypoxia requiring 50L heated HFNC plus NRB with sats still 70's. ICU transfer for higher level of care. 11/25: delirium improved, precedex off, afebrile, patient awake and asking for water. Requiring 35L HFNC. Palliative medicine consultation for support.   Clinical Assessment and Goals of Care: PMT consult received and chart reviewed. Discussed with [name] PCCM NP and RN. Patient is improving and stable for transfer out of ICU when bed available.   Spoke with significant other, [name] via telephone. Introduced role of palliative medicine and ongoing support this admission. Reviewed course of hospitalization and provided update on condition and plan of care. [name] is glad to hear he is showing some improvement and remains hopeful he can pull through this. She is trying her best to speak with him daily to keep his spirits up. She is 'praying for a miracle.' We discussed kind and loving decision of honoring his wishes for DNR/DNI if his condition should worsen. Emotional/spiritual support provided.   [name] asks about next steps. Explained ongoing watchful waiting with medical management, PT/OT efforts when possible, and advance diet as tolerated.   Answered questions. [name] is appreciative of all updates today including from [place] and Therapist, sports.    SUMMARY OF RECOMMENDATIONS    DNR/DNI, otherwise full scope  treatment. Continue medical management and watchful waiting.   Per PCCM, patient stable for transfer out of ICU when bed available.  PT/OT efforts when appropriate.  PMT will continue to shadow for needs/decline.   Code Status/Advance Care Planning:  DNR/DNI per patient wishes  Symptom Management:   Per attending  Palliative Prophylaxis:   Aspiration, Delirium Protocol, Frequent Pain Assessment, Oral Care and Turn Reposition  Psycho-social/Spiritual:   Desire for further Chaplaincy support: yes  Additional Recommendations: Caregiving  Support/Resources and Compassionate Wean Education  Prognosis:   Unable to determine  Discharge Planning: To Be Determined      Primary Diagnoses: Present on Admission: . Pneumonia due to COVID-19 virus . Hypothyroidism   I have reviewed the medical record, interviewed the patient and family, and examined the patient. The following aspects are pertinent.  Past Medical History:  Diagnosis Date  . Allergic rhinitis, cause unspecified   . Anginal pain ([place])   . Anxiety   . Arthritis    "hands, neck, back, hips, knees" (05/17/2016)  . CAD (coronary artery disease)    a. history of sudden cardiac arrest, s/p MI's in 1995 and 1997 with prior stenting;  b. 07/2013 Cath/PCI: LM nl, LAD 50p/m, D1 nl, LCX nl, RI nl, RCA dominant, 99ost (3.0x20  Promus Premier DES)/19m3.0x12 Promus Premier DES), EF 55% w/ inf HK.c. 04/2016 3.0 x 12 mm Promus Premier DES x 1 mid LAD   . Carotid artery disease ([facility]    a. Dopplers 01/2012: 60-79% bilat carotid dz;  b. 07/2013 60-79% RICA stenosis & 469-62%LICA stenosis.  . Chronic lower back pain   . Closed TBI (traumatic brain injury) ([facility] [place]   "memory issues since" (08/04/2013)  . Depression   . 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 s/p radical neck dissection. b. followed once a year - last check 09/2012 was OK.  . Diverticulosis of colon (without mention of hemorrhage)   . Falls frequently    "here  lately" (05/17/2016)  . GERD (gastroesophageal reflux disease)   . Heart murmur   . Hyperlipemia   . Lumbago   . Lumbar disc disease    a. with chronic LBP  . Male infertility, unspecified   . Migraines    "monthly at least" (05/17/2016)  . Myocardial infarction ([facility] ~ 1995  . NHL (non-Hodgkin's lymphoma) ([facility] 10/09/2013  . OSA (obstructive sleep apnea)    a. not using CPAP (05/17/2016)  . Posttraumatic stress disorder   . Unspecified essential hypertension   . Unspecified hypothyroidism   . Unspecified vitamin D deficiency 10/12/2013  . Ventricular bigeminy    a. pseudobradycardia 07/2013 - bigeminy tends to register low on HR monitors.   Social History   Socioeconomic History  . Marital status: Divorced    Spouse name: Not on file  . Number of children: Not on file  . Years of education: Not on file  . Highest education level: Not on file  Occupational History  . Occupation: Disabled  Tobacco Use  . Smoking status: Former Smoker    Packs/day: 1.50    Years: 35.00    Pack years: 52.50    Types: Cigarettes    Quit date: 04/11/2004    Years since quitting: 16.6  . Smokeless tobacco: Never Used  Vaping Use  . Vaping Use: Never used  Substance and Sexual Activity  . Alcohol use: Yes    Comment: Hx abuse, quit 2005  . Drug use: Yes    Types: Cocaine    Comment: 05/17/2016 "used all kinds of drugs; stopped in 2004"  . Sexual activity: Not Currently  Other Topics Concern  . Not on file  Social History Narrative   HSG.  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 On disability and uses [facility] as primary healthcare.  Lives with wife. They  lost her son in a motorcycle accident by a drunk driver.  Close to his 260year old granddaughter.    Social Determinants of Health   Financial Resource Strain:   . Difficulty of Paying Living Expenses: Not on file  Food Insecurity:   . Worried About RCharity fundraiserin the Last Year: Not on file  . Ran Out of Food in the Last Year: Not on  file  Transportation Needs:   . Lack of Transportation (Medical): Not on file  . Lack of Transportation (Non-Medical): Not on file  Physical Activity:   . Days of Exercise per Week: Not on file  . Minutes of Exercise per Session: Not on file  Stress:   . Feeling of Stress : Not on file  Social Connections:   . Frequency of Communication with Friends and Family: Not on file  . Frequency of Social Gatherings with Friends and Family: Not on file  .  Attends Religious Services: Not on file  . Active Member of Clubs or Organizations: Not on file  . Attends Archivist Meetings: Not on file  . Marital Status: Not on file   Family History  Problem Relation Age of Onset  . Heart attack Father        3 heart attacks   . Cancer Mother   . Heart disease Mother   . Alcohol abuse Other    Scheduled Meds: . aspirin  81 mg Per Tube Daily  . atorvastatin  80 mg Per Tube QHS  . baricitinib  4 mg Per Tube Daily  . chlorhexidine  15 mL Mouth Rinse BID  . Chlorhexidine Gluconate Cloth  6 each Topical Daily  . clopidogrel  75 mg Per Tube Q breakfast  . enoxaparin (LOVENOX) injection  55 mg Subcutaneous Daily  . feeding supplement (PROSource TF)  45 mL Per Tube TID  . free water  200 mL Per Tube Q4H  . insulin aspart  0-20 Units Subcutaneous Q4H  . insulin aspart  4 Units Subcutaneous Q6H  . [START ON 11/18/2020] insulin glargine  30 Units Subcutaneous Daily  . levothyroxine  200 mcg Per Tube Q0600  . mouth rinse  15 mL Mouth Rinse q12n4p  . methylPREDNISolone (SOLU-MEDROL) injection  40 mg Intravenous Q12H  . metoprolol tartrate  25 mg Per Tube BID  . pantoprazole (PROTONIX) IV  40 mg Intravenous Q24H  . senna  1 tablet Per Tube BID  . traZODone  200 mg Per Tube QHS   Continuous Infusions: . dexmedetomidine (PRECEDEX) IV infusion Stopped (11/17/20 0908)  . feeding supplement (VITAL 1.5 CAL) 60 mL/hr at 11/17/20 0800   PRN Meds:.acetaminophen, ondansetron **OR** ondansetron  (ZOFRAN) IV Medications Prior to Admission:  Prior to Admission medications   Medication Sig Start Date End Date Taking? Authorizing Provider  amitriptyline (ELAVIL) 25 MG tablet Take 25 mg by mouth at bedtime as needed for sleep.   Yes [provider]  ascorbic acid (VITAMIN C) 500 MG tablet TAKE ONE TABLET BY MOUTH DAILY -  PLEASE TAKE WITH YOUR IRON SUPPLEMENT, IT WILL HELP IT WORKED BETTER,  VITAMIN C HELPS HER IMMUNE SYSTEM TOO. -  PLEASE TAKE WITH YOUR IRON SUPPLEMENT, IT WILL HELP IT WORKED BETTER,   VITAMIN C HELPS HER IMMUNE SYSTEM TOO. 08/21/19  Yes [provider]  aspirin 81 MG tablet Take 1 tablet (81 mg total) by mouth daily. 08/06/13  Yes Theora Gianotti, NP  atorvastatin (LIPITOR) 10 MG tablet Take 10 mg by mouth every evening.   Yes [provider]  buPROPion (WELLBUTRIN XL) 300 MG 24 hr tablet Take 300 mg by mouth daily.   Yes [provider]  Carboxymethylcell-Hypromellose 0.25-0.3 % GEL Apply 2 drops to eye in the morning, at noon, in the evening, and at bedtime. Both eyes.   Yes [provider]  cholecalciferol 2000 UNITS TABS Take 2,000 Units by mouth daily. 10/12/13  Yes Heath Lark, MD  clopidogrel (PLAVIX) 75 MG tablet Take 1 tablet (75 mg total) by mouth daily with breakfast. 08/06/13  Yes Theora Gianotti, NP  cyclobenzaprine (FLEXERIL) 10 MG tablet Take 1 tablet by mouth at bedtime. 08/21/19  Yes [provider]  diclofenac (VOLTAREN) 75 MG EC tablet Take 75 mg by mouth every evening.   Yes [provider]  docusate sodium (COLACE) 100 MG capsule Take 100 mg by mouth daily as needed for mild constipation.   Yes [provider]  DULoxetine (CYMBALTA) 60 MG capsule Take 60 mg by mouth daily.   Yes [provider]  ferrous sulfate 325 (65 FE) MG tablet TAKE ONE TABLET BY MOUTH TUESDAY, THURSDAY AND SATURDAY FOR LOW IRON, TAKE WITH VITAMIN C 500 MG (ASCORBIC ACID) 08/21/19  Yes  [provider]  furosemide (LASIX) 20 MG tablet Take 20 mg by mouth daily.   Yes [provider]  gabapentin (NEURONTIN) 300 MG capsule Take 300 mg by mouth 3 (three) times daily.   Yes [provider]  hydrOXYzine (VISTARIL) 25 MG capsule Take 25 mg by mouth 3 (three) times daily as needed for anxiety.   Yes [provider]  levothyroxine (SYNTHROID) 137 MCG tablet Take 137 mcg by mouth 2 (two) times daily before a meal.   Yes [provider]  Lidocaine-Adhesive Sheets (LIDOPURE PATCH) 5 % KIT Apply 1 patch topically daily.   Yes [provider]  metoprolol succinate (TOPROL-XL) 100 MG 24 hr tablet Take 50 mg by mouth every evening. Take with or immediately following a meal.   Yes [provider]  mirtazapine (REMERON) 15 MG tablet Take 15 mg by mouth at bedtime.  01/25/20  Yes [provider]  Multiple Vitamin (MULTIVITAMIN WITH MINERALS) TABS tablet Take 1 tablet by mouth daily.   Yes [provider]  mupirocin ointment (BACTROBAN) 2 % 1 application 3 (three) times daily.   Yes [provider]  pantoprazole (PROTONIX) 40 MG tablet Take 1 tablet (40 mg total) by mouth daily. 08/14/13  Yes Theora Gianotti, NP  phenylephrine (HEMORRHOIDAL) 0.25 % suppository Place 1 suppository rectally 2 (two) times daily.   Yes [provider]  polyethylene glycol powder (GLYCOLAX/MIRALAX) 17 GM/SCOOP powder Take by mouth. 07/18/17  Yes [provider]  potassium chloride (MICRO-K) 10 MEQ CR capsule Take by mouth.   Yes [provider]  senna (SENOKOT) 8.6 MG tablet Take 2 tablets by mouth daily.    Yes [provider]  sotalol (BETAPACE) 120 MG tablet Take 120 mg by mouth 2 (two) times daily.   Yes [provider]  traZODone (DESYREL) 100 MG tablet Take 200 mg by mouth at bedtime.   Yes [provider]  vitamin E 180 MG (400 UNITS) capsule TAKE 1 CAPSULE BY MOUTH  TUESDAY, THURSDAY AND SATURDAY 08/21/19  Yes [provider]  atorvastatin (LIPITOR) 80 MG tablet Take 1 tablet (80 mg total) by mouth daily. Patient not taking: Reported on 10/27/2020 08/06/13   Theora Gianotti, NP  nitroGLYCERIN (NITROSTAT) 0.4 MG SL tablet Place 1 tablet (0.4 mg total) under the tongue every 5 (five) minutes x 3 doses as needed for chest pain. Patient not taking: Reported on 10/28/2020 06/05/16   Imogene Burn, PA-C  sotalol (BETAPACE) 80 MG tablet Take 1-1.5 tablets (80-120 mg total) by mouth See admin instructions. Take 120 mg by mouth in the morning and 80 mg at night Patient not taking: Reported on 10/24/2020 03/18/18   Baldwin Jamaica, PA-C   Allergies  Allergen Reactions  . Fish Allergy Nausea And Vomiting  . Shellfish Allergy Nausea And Vomiting  . Tape Other (See Comments)    Plastic tape pulls off top layer of skin. Paper/cloth tape ok  . Morphine Other (See Comments)    Delusions   Review of Systems  Unable to perform ROS: Acuity of condition   Physical Exam  Vital Signs: BP (!) 161/63   Pulse 86   Temp 98.3  F (36.8 C) (Oral)   Resp (!) 24   Ht 5' 5"  (1.651 m)   Wt 105.2 kg   SpO2 92%   BMI 38.59 kg/m  Pain Scale: 0-10 POSS *See Group Information*: 1-Acceptable,Awake and alert Pain Score: 0-No pain   SpO2: SpO2: 92 % O2 Device:SpO2: 92 % O2 Flow Rate: .O2 Flow Rate (L/min): 40 L/min (40 L HHFNC/15 L NRB)  IO: Intake/output summary:   Intake/Output Summary (Last 24 hours) at 11/17/2020 1226 Last data filed at 11/17/2020 1122 Gross per 24 hour  Intake 1367.6 ml  Output 1225 ml  Net 142.6 ml    LBM: Last BM Date: 11/11/20 Baseline Weight: Weight: 111.9 kg Most recent weight: Weight: 105.2 kg     Palliative Assessment/Data: PPS 40%    Time Total: 30 Greater than 50%  of this time was spent counseling and coordinating care related to the above assessment and plan.  The above conversation was completed via  telephone due to visitor restrictions during COVID-19 pandemic. Thorough chart review and discussion with multidisciplinary team was completed as part of assessment. No physical examination was performed.    Signed by:  Ihor Dow, DNP, FNP-C Palliative Medicine Team  Phone: [phone] Fax: [phone]   Please contact Palliative Medicine Team phone at [phone] for questions and concerns.  For individual provider: See Shea Evans

## 2020-11-18 DIAGNOSIS — J1282 Pneumonia due to coronavirus disease 2019: Secondary | ICD-10-CM | POA: Diagnosis not present

## 2020-11-18 DIAGNOSIS — U071 COVID-19: Secondary | ICD-10-CM | POA: Diagnosis not present

## 2020-11-18 LAB — BASIC METABOLIC PANEL
Anion gap: 12 (ref 5–15)
BUN: 30 mg/dL — ABNORMAL HIGH (ref 8–23)
CO2: 24 mmol/L (ref 22–32)
Calcium: 8.3 mg/dL — ABNORMAL LOW (ref 8.9–10.3)
Chloride: 116 mmol/L — ABNORMAL HIGH (ref 98–111)
Creatinine, Ser: 0.99 mg/dL (ref 0.61–1.24)
GFR, Estimated: >60 mL/min (ref >60)
Glucose, Bld: 339 mg/dL — ABNORMAL HIGH (ref 70–99)
Potassium: 5.3 mmol/L — ABNORMAL HIGH (ref 3.5–5.1)
Sodium: 152 mmol/L — ABNORMAL HIGH (ref 135–145)

## 2020-11-18 LAB — GLUCOSE, CAPILLARY
Glucose-Capillary: 224 mg/dL — ABNORMAL HIGH (ref 70–99)
Glucose-Capillary: 270 mg/dL — ABNORMAL HIGH (ref 70–99)
Glucose-Capillary: 303 mg/dL — ABNORMAL HIGH (ref 70–99)
Glucose-Capillary: 312 mg/dL — ABNORMAL HIGH (ref 70–99)
Glucose-Capillary: 322 mg/dL — ABNORMAL HIGH (ref 70–99)
Glucose-Capillary: 325 mg/dL — ABNORMAL HIGH (ref 70–99)
Glucose-Capillary: 356 mg/dL — ABNORMAL HIGH (ref 70–99)

## 2020-11-18 LAB — CBC
HCT: 47.5 % (ref 39.0–52.0)
Hemoglobin: 14.2 g/dL (ref 13.0–17.0)
MCH: 28.6 pg (ref 26.0–34.0)
MCHC: 29.9 g/dL — ABNORMAL LOW (ref 30.0–36.0)
MCV: 95.8 fL (ref 80.0–100.0)
Platelets: 235 10*3/uL (ref 150–400)
RBC: 4.96 MIL/uL (ref 4.22–5.81)
RDW: 14.6 % (ref 11.5–15.5)
WBC: 12.6 10*3/uL — ABNORMAL HIGH (ref 4.0–10.5)
nRBC: 0 % (ref 0.0–0.2)

## 2020-11-18 MED ORDER — INSULIN GLARGINE 100 UNIT/ML ~~LOC~~ SOLN
40.0000 [IU] | Freq: Every day | SUBCUTANEOUS | Status: DC
  Administered 2020-11-19: 40 [IU] via SUBCUTANEOUS
  Filled 2020-11-18: qty 0.4

## 2020-11-18 MED ORDER — SODIUM ZIRCONIUM CYCLOSILICATE 5 G PO PACK
5.0000 g | PACK | Freq: Once | ORAL | Status: AC
  Administered 2020-11-18: 5 g
  Filled 2020-11-18 (×2): qty 1

## 2020-11-18 MED ORDER — INSULIN ASPART 100 UNIT/ML ~~LOC~~ SOLN
8.0000 [IU] | SUBCUTANEOUS | Status: DC
  Administered 2020-11-18 – 2020-11-19 (×4): 8 [IU] via SUBCUTANEOUS

## 2020-11-18 MED ORDER — INSULIN ASPART 100 UNIT/ML ~~LOC~~ SOLN
6.0000 [IU] | SUBCUTANEOUS | Status: DC
  Administered 2020-11-18 (×2): 6 [IU] via SUBCUTANEOUS

## 2020-11-18 MED ORDER — SENNOSIDES-DOCUSATE SODIUM 8.6-50 MG PO TABS
1.0000 | ORAL_TABLET | Freq: Two times a day (BID) | ORAL | Status: DC
  Administered 2020-11-18: 1
  Filled 2020-11-18 (×2): qty 1

## 2020-11-18 MED ORDER — METHYLPREDNISOLONE SODIUM SUCC 40 MG IJ SOLR: 20.0000 mg | Freq: Every day | INTRAMUSCULAR | Status: DC

## 2020-11-18 MED ORDER — POLYETHYLENE GLYCOL 3350 17 G PO PACK
17.0000 g | PACK | Freq: Every day | ORAL | Status: DC
  Administered 2020-11-18: 17 g
  Filled 2020-11-18: qty 1

## 2020-11-18 MED ORDER — MORPHINE SULFATE (PF) 2 MG/ML IV SOLN
INTRAVENOUS | Status: AC
  Filled 2020-11-18: qty 1

## 2020-11-18 MED ORDER — METHYLPREDNISOLONE SODIUM SUCC 40 MG IJ SOLR: 20.0000 mg | Freq: Two times a day (BID) | INTRAMUSCULAR | Status: DC

## 2020-11-18 MED ORDER — INSULIN ASPART 100 UNIT/ML ~~LOC~~ SOLN: 4.0000 [IU] | SUBCUTANEOUS | Status: DC

## 2020-11-18 MED ORDER — INSULIN ASPART 100 UNIT/ML ~~LOC~~ SOLN: 10.0000 [IU] | SUBCUTANEOUS | Status: DC

## 2020-11-18 MED ORDER — QUETIAPINE FUMARATE 50 MG PO TABS
50.0000 mg | ORAL_TABLET | Freq: Every day | ORAL | Status: DC
  Administered 2020-11-18: 50 mg
  Filled 2020-11-18: qty 1

## 2020-11-18 MED ORDER — MORPHINE SULFATE (PF) 2 MG/ML IV SOLN
1.0000 mg | Freq: Once | INTRAVENOUS | Status: AC
  Administered 2020-11-18: 1 mg via INTRAVENOUS

## 2020-11-18 NOTE — Progress Notes (Signed)
NAME:  Michael Singleton, MRN:  [id number], DOB:  10/21/1956, LOS: 8 ADMISSION DATE:  11/22/2020  Interm history/ Subjective   Initial consult note from palliative care reviewed--no significant changes to goals of care  Pt has no complaints on rounds this morning. He kept wanting ice chips however O2 sats drop rapidly with the NR mask off. Significant Hospital Events   11/18 Admission 11/20 Tx to ICU 11/24 Code status changed to DNR/DNI 11/26 Transfer back to IMTS  Objective   Blood pressure 139/66, pulse (!) 108, temperature (P) 99.9 F (37.7 C), temperature source (P) Axillary, resp. rate (!) 31, height 5\' 5"  (1.651 m), weight 105.2 kg, SpO2 90 %.     Intake/Output Summary (Last 24 hours) at 11/18/2020 0516 Last data filed at 11/18/2020 0000 Gross per 24 hour  Intake 873.7 ml  Output 1075 ml  Net -201.3 ml   Filed Weights   11/13/20 0451 11/16/20 0322 11/17/20 0352  Weight: 109.4 kg 104.5 kg 105.2 kg    Examination: General: critically ill appearing, in NAD HEENT: dressing on bridge of nose CV: extremities warm. Good radial pulses. Pulm: breathing comfortably on 50L HHF with 15L NR. O2 sats drop quickly with mask removal. Lungs clear bilaterally GI: non-distended. No pain on palpation Neuro: awake. Difficult to assess mental status with oxygen requirements but seemed oriented.  Consults:  Palliative  Significant Diagnostic Tests:  CTA chest 11/18 >> No evidence of pulmonary embolism to the lobar branch level. Extensive ground-glass consolidation throughout the bilateral lung fields, consistent with multifocal pneumonia in the setting of known COVID-19 infection.  Micro Data:  COVID 11/18 >> Positive  Influenza A/B 11/18 >> negative  BCx2 11/18 >> negative   Antimicrobials:  Remdesivir 11/18>>11/22 Baricitinib 11/19, 11/24>>  Labs    CBC Latest Ref Rng & Units 11/17/2020 11/16/2020 11/15/2020  WBC 4.0 - 10.5 K/uL 10.8(H) 10.7(H) 11.5(H)  Hemoglobin 13.0 -  17.0 g/dL 13.8 13.2 13.9  Hematocrit 39 - 52 % 46.7 43.0 43.9  Platelets 150 - 400 K/uL 196 205 226   BMP Latest Ref Rng & Units 11/17/2020 11/16/2020 11/15/2020  Glucose 70 - 99 mg/dL 309(H) 304(H) 241(H)  BUN 8 - 23 mg/dL 31(H) 29(H) 34(H)  Creatinine 0.61 - 1.24 mg/dL 0.88 1.01 0.98  BUN/Creat Ratio 10 - 24 - - -  Sodium 135 - 145 mmol/L 157(H) 152(H) 152(H)  Potassium 3.5 - 5.1 mmol/L 4.5 4.8 4.6  Chloride 98 - 111 mmol/L 120(H) 114(H) 112(H)  CO2 22 - 32 mmol/L 25 27 28   Calcium 8.9 - 10.3 mg/dL 8.1(L) 7.8(L) 8.1(L)    Summary  64 y/o, un-vaccinated, male with hx HTN, non-hodgkins lymphoma, CVA, CAD, PAD s/p L AKA, HTN initially admitted 11/18 with COVID PNA.  He was treated with baricitinib, remdesivir and dexamethasone. On 11/20 he had progressive dyspnea and hypoxia requiring 50L heated HFNC plus NRB with sats still 70's and he was transferred to ICU for BiPAP use.  He has remained dependent on HHF and NR and is now DNR/DNI.  He was transferred back to IMTS 11/26.  Assessment & Plan:  Principal Problem:   Pneumonia due to COVID-19 virus Active Problems:   Hypothyroidism   Acute respiratory failure with hypoxia (HCC)   Palliative care by specialist   Acute hypoxic respiratory failure due to COVID 19 Pneumonia Increased supplemental O2 requirement since yesterday 35L>50L. O2 sats that are charted are in the low 80s however I did clarify with bedside RN and she notes that  this is primarily when the patient is moving around but that they are in the mid to high 80s otherwise.  5d course of remdesivir completed Plan PRN BiPAP while in ICU. HHFNC as tolerated.  Prone as able Maintain O2 sats >85% Continue baricitanib Continue solumedrol--wean as able  Acute toxic/ metabolic encephalopathy. ICU delerium Plan -start seroquel qhs -delerium precautions  Pressure ulcer to bridge of nose--thought to be 2/2 to bipap mask. Continue dressing changes.  Hypernatremia. Sodium down  nicely 157>152 from yesterday. Continue FW per tube. Daily BMP.  Steroid induced Hyperglycemia. Glucoses above goal this morning. 24h novolog requirement 90U. Increase lantus to 40U daily. Continue novolog 6U q4h and resistant SSI.  #Hypertension/ CAD. Continue lopressor, aspirin, statin, plavix #Hypothyroidism. Continue synthroid #Goals of care. Will continue ongoing discussions with family regarding goals of care. Best practice:  CODE STATUS: DNR/DNI Diet: tube feeds DVT for prophylaxis: lovenox Dispo: ICU   Mitzi Hansen, MD Internal Medicine Resident PGY-2 [facility] Pager: [phone] 11/18/2020 5:37 AM    Please use the IMTS after hours pager at [phone] after 5pm M-F and on weekends

## 2020-11-18 NOTE — Progress Notes (Signed)
Assisted tele visit to patient with wife.  Shin Lamour Anderson, RN   

## 2020-11-18 NOTE — Plan of Care (Signed)
Patient and patient's wife (via E-link cam) educated extensively on the patient's plan of care. Patient again educated on the need for mitts to keep him from pulling off his oxygen which patient has attempted doing multiple times throughout the day even with frequent reorientation/ education and with the use of mitts. Patient and patient's wife in agreement that they understand patient's need for mitts and the serious complications that could arise if the patient removed his oxygen including a respiratory or cardiac event. Patient states understanding after given education. Patient will remain in mitts and will be frequently monitored and re-educated.

## 2020-11-18 NOTE — Progress Notes (Signed)
OT Cancellation Note  Patient Details Name: Jubal Rademaker MRN: [id number] DOB: [birth year]   Cancelled Treatment:    Reason Eval/Treat Not Completed: Patient not medically ready (Pt with SpO2 at 82% with [place] and NRB lying in supine. Will hold until more stable)  Zenovia Jarred, MSOT, OTR/L [facility] Office Number: [phone] Pager: [phone]  Zenovia Jarred 11/18/2020, 11:12 AM

## 2020-11-18 NOTE — Progress Notes (Signed)
SLP Cancellation Note  Patient Details Name: Kyrel Leighton MRN: [id number] DOB: Feb 23, 1956   Cancelled swallow evaluation:        Pt with high O2 needs and desaturating when he removes NRB. Not ready for swallowing assessment. Discussed with RN. Will continue efforts.  Naziyah Tieszen L. Tivis Ringer, [place] Office number [phone] Pager [phone]    Juan Quam Laurice 11/18/2020, 2:26 PM

## 2020-11-18 NOTE — Progress Notes (Signed)
PT Cancellation Note  Patient Details Name: Michael Singleton MRN: [id number] DOB: 1956/09/11   Cancelled Treatment:    Reason Eval/Treat Not Completed: Patient not medically ready (pt struggling to maintain sats 82% at rest and not currently appropriate to mobilize)   [name] 11/18/2020, 11:04 AM  [place] Pager: [phone] Office: [phone]

## 2020-11-19 DIAGNOSIS — T380X5A Adverse effect of glucocorticoids and synthetic analogues, initial encounter: Secondary | ICD-10-CM

## 2020-11-19 DIAGNOSIS — E870 Hyperosmolality and hypernatremia: Secondary | ICD-10-CM

## 2020-11-19 DIAGNOSIS — U071 COVID-19: Secondary | ICD-10-CM | POA: Diagnosis not present

## 2020-11-19 DIAGNOSIS — Z789 Other specified health status: Secondary | ICD-10-CM

## 2020-11-19 DIAGNOSIS — R739 Hyperglycemia, unspecified: Secondary | ICD-10-CM

## 2020-11-19 DIAGNOSIS — J1282 Pneumonia due to coronavirus disease 2019: Secondary | ICD-10-CM | POA: Diagnosis not present

## 2020-11-19 DIAGNOSIS — Z66 Do not resuscitate: Secondary | ICD-10-CM

## 2020-11-19 DIAGNOSIS — I472 Ventricular tachycardia, unspecified: Secondary | ICD-10-CM

## 2020-11-19 DIAGNOSIS — R0603 Acute respiratory distress: Secondary | ICD-10-CM | POA: Diagnosis not present

## 2020-11-19 DIAGNOSIS — G9341 Metabolic encephalopathy: Secondary | ICD-10-CM

## 2020-11-19 DIAGNOSIS — L8990 Pressure ulcer of unspecified site, unspecified stage: Secondary | ICD-10-CM

## 2020-11-19 DIAGNOSIS — J9601 Acute respiratory failure with hypoxia: Secondary | ICD-10-CM

## 2020-11-19 DIAGNOSIS — Z515 Encounter for palliative care: Secondary | ICD-10-CM

## 2020-11-19 LAB — GLUCOSE, CAPILLARY
Glucose-Capillary: 262 mg/dL — ABNORMAL HIGH (ref 70–99)
Glucose-Capillary: 166 mg/dL — ABNORMAL HIGH (ref 70–99)

## 2020-11-19 MED ORDER — LORAZEPAM 1 MG PO TABS: 1.0000 mg | ORAL_TABLET | ORAL | Status: DC | PRN

## 2020-11-19 MED ORDER — MORPHINE BOLUS VIA INFUSION
1.0000 mg | INTRAVENOUS | Status: DC | PRN
  Administered 2020-11-19: 2 mg via INTRAVENOUS
  Filled 2020-11-19: qty 3

## 2020-11-19 MED ORDER — GLYCOPYRROLATE 0.2 MG/ML IJ SOLN: 0.2000 mg | INTRAMUSCULAR | Status: DC | PRN

## 2020-11-19 MED ORDER — ACETAMINOPHEN 650 MG RE SUPP: 650.0000 mg | Freq: Four times a day (QID) | RECTAL | Status: DC | PRN

## 2020-11-19 MED ORDER — MORPHINE SULFATE (CONCENTRATE) 10 MG/0.5ML PO SOLN: 5.0000 mg | ORAL | Status: DC | PRN

## 2020-11-19 MED ORDER — HALOPERIDOL LACTATE 5 MG/ML IJ SOLN: 2.0000 mg | INTRAMUSCULAR | Status: DC | PRN

## 2020-11-19 MED ORDER — LORAZEPAM 2 MG/ML IJ SOLN: 1.0000 mg | INTRAMUSCULAR | Status: DC | PRN

## 2020-11-19 MED ORDER — LORAZEPAM 2 MG/ML PO CONC: 1.0000 mg | ORAL | Status: DC | PRN

## 2020-11-19 MED ORDER — SODIUM CHLORIDE 0.9% FLUSH: 10.0000 mL | Freq: Two times a day (BID) | INTRAVENOUS | Status: DC

## 2020-11-19 MED ORDER — GLYCOPYRROLATE 1 MG PO TABS: 1.0000 mg | ORAL_TABLET | ORAL | Status: DC | PRN

## 2020-11-19 MED ORDER — MORPHINE BOLUS VIA INFUSION
2.0000 mg | INTRAVENOUS | Status: DC | PRN
  Filled 2020-11-19: qty 5

## 2020-11-19 MED ORDER — SODIUM CHLORIDE 0.9% FLUSH: 10.0000 mL | INTRAVENOUS | Status: DC | PRN

## 2020-11-19 MED ORDER — MORPHINE 100MG IN NS 100ML (1MG/ML) PREMIX INFUSION
2.0000 mg/h | INTRAVENOUS | Status: DC
  Administered 2020-11-19: 2 mg/h via INTRAVENOUS
  Filled 2020-11-19 (×2): qty 100

## 2020-11-19 MED ORDER — ACETAMINOPHEN 325 MG PO TABS: 650.0000 mg | ORAL_TABLET | Freq: Four times a day (QID) | ORAL | Status: DC | PRN

## 2020-11-19 MED ORDER — POLYVINYL ALCOHOL 1.4 % OP SOLN
1.0000 [drp] | Freq: Four times a day (QID) | OPHTHALMIC | Status: DC | PRN
  Filled 2020-11-19: qty 15

## 2020-11-19 MED ORDER — BIOTENE DRY MOUTH MT LIQD: 15.0000 mL | OROMUCOSAL | Status: DC | PRN

## 2020-11-19 MED ORDER — BIOTENE DRY MOUTH MT LIQD
15.0000 mL | Freq: Two times a day (BID) | OROMUCOSAL | Status: DC
  Administered 2020-11-19: 15 mL via TOPICAL

## 2020-11-23 NOTE — Progress Notes (Signed)
RT note- Placed back to Bipap/NIV, patient removed HFNC and NRB, sp02 65%. Now 94%, patient is not responsive at this time.

## 2020-11-23 NOTE — Progress Notes (Signed)
[facility] Progress Note Patient Name: Michael Singleton DOB: 13-Aug-1956 MRN: [id number]   Date of Service  2020/12/13  HPI/Events of Note  RN requests a PRN for management of agitation and BiPAP non-adherence.   eICU Interventions  Ordered Haldol 2-4mg  IV Q4H PRN agitation.     Intervention Category Minor Interventions: Agitation / anxiety - evaluation and management  Charlott Rakes 2020/12/13, 12:24 AM

## 2020-11-23 NOTE — Progress Notes (Signed)
Passed pt away peacefully at 47 with family and wife at bedside.  This RN and Corinda Gubler RN auscultated for 1 full minute with no heart sounds heard.  [place] was notified and referral [phone].  Pt wife took all belongings including medication that belonged to pt with her.   Irven [place], RN

## 2020-11-23 NOTE — Progress Notes (Signed)
PT Cancellation Note  Patient Details Name: Andrei Mccook MRN: [id number] DOB: 08/16/1956   Cancelled Treatment:    Reason Eval/Treat Not Completed: Patient not medically ready;Other (comment) (transition to comfort care.).  Will sign off at this time. 11/23/20  Ginger Carne., PT [facility] Services [phone]  (pager) [phone]  (office)   Tessie Fass Marl [place] 11/23/20, 12:36 PM

## 2020-11-23 NOTE — Progress Notes (Signed)
NAME:  Michael Singleton, MRN:  [id number], DOB:  July 13, 1956, LOS: 9 ADMISSION DATE:  10/25/2020  Interm history/ Subjective   Pt noted to continue to take off his mask yesterday afternoon and night. E-link called per RN who started prn haldol. Bedside RN notes that pt has been having several runs of unsustained VT this morning resulting in ICD shocks. He also required BiPAP again due to persistent hypoxia on HHF/NR.  I updated his wife, Michael Singleton, on the phone regarding his declining condition. She notes that she does not want him to be in pain and remember one time when he was shocked at home and how much he despised it. She wishes to proceed with comfort measures at this time.  Significant Hospital Events   11/18 Admission 11/20 Tx to ICU 11/24 Code status changed to DNR/DNI 11/26 Transfer back to IMTS 11/27 Transitioned to comfort  Objective   Blood pressure (!) 114/59, pulse (!) 126, temperature 98.8 F (37.1 C), temperature source Axillary, resp. rate (!) 40, height 5\' 5"  (1.651 m), weight 103.3 kg, SpO2 94 %.     Intake/Output Summary (Last 24 hours) at Dec 02, 2020 1339 Last data filed at 2020-12-02 1000 Gross per 24 hour  Intake 1920 ml  Output 1200 ml  Net 720 ml   Filed Weights   11/17/20 0352 11/18/20 0500 12/02/20 0350  Weight: 105.2 kg 105 kg 103.3 kg    Examination: General: critically ill appearing HEENT: dressing on bridge of nose, BiPAP on CV: extremities cool. Tachycardic rate Pulm: initially on HHF/NR 50L sating in the 60s-low 80s. Now on BiPAP with O2 sats in mid to high 90s.no adventitious lung sounds appreciable. GI: non-distended. Soft.  Neuro: lethargic. Opens eyes to voice but falls back asleep quickly.  Consults:  Palliative  Significant Diagnostic Tests:  CTA chest 11/18 >> No evidence of pulmonary embolism to the lobar branch level. Extensive ground-glass consolidation throughout the bilateral lung fields, consistent with multifocal pneumonia in  the setting of known COVID-19 infection.  Micro Data:  COVID 11/18 >> Positive  Influenza A/B 11/18 >> negative  BCx2 11/18 >> negative   Antimicrobials:  Remdesivir 11/18>>11/22 Baricitinib 11/19, 11/24>>11/27  Labs    CBC Latest Ref Rng & Units 11/18/2020 11/17/2020 11/16/2020  WBC 4.0 - 10.5 K/uL 12.6(H) 10.8(H) 10.7(H)  Hemoglobin 13.0 - 17.0 g/dL 14.2 13.8 13.2  Hematocrit 39 - 52 % 47.5 46.7 43.0  Platelets 150 - 400 K/uL 235 196 205   BMP Latest Ref Rng & Units 11/18/2020 11/17/2020 11/16/2020  Glucose 70 - 99 mg/dL 339(H) 309(H) 304(H)  BUN 8 - 23 mg/dL 30(H) 31(H) 29(H)  Creatinine 0.61 - 1.24 mg/dL 0.99 0.88 1.01  BUN/Creat Ratio 10 - 24 - - -  Sodium 135 - 145 mmol/L 152(H) 157(H) 152(H)  Potassium 3.5 - 5.1 mmol/L 5.3(H) 4.5 4.8  Chloride 98 - 111 mmol/L 116(H) 120(H) 114(H)  CO2 22 - 32 mmol/L 24 25 27   Calcium 8.9 - 10.3 mg/dL 8.3(L) 8.1(L) 7.8(L)    Summary  64 y/o, un-vaccinated, male with hx HTN, non-hodgkins lymphoma, CVA, CAD, PAD s/p L AKA, HTN initially admitted 11/18 with COVID PNA.  He was treated with baricitinib, remdesivir and dexamethasone. On 11/20 he had progressive dyspnea and hypoxia requiring 50L heated HFNC plus NRB with sats still 70's and he was transferred to ICU for BiPAP use.  He has remained dependent on HHF and NR and is now DNR/DNI.  He was transferred back to IMTS 11/26. His condition  continued to decline on 11/27 with continued respiratory failure requiring BiPAP along with developing non-sustained VT resulting in multiple ICD shocks. After discussion with his wife, Michael Singleton, he was transitioned to comfort care.  Assessment & Plan:  Principal Problem:   Pneumonia due to COVID-19 virus Active Problems:   Hypothyroidism   CAD (coronary artery disease)   Acute respiratory failure with hypoxia ([place])   Palliative care by specialist   DNR (do not resuscitate)   Metabolic encephalopathy   Hypernatremia   Steroid-induced  hyperglycemia   Ventricular tachycardia ([place])   Pressure ulcer  Comfort care: After discussion with his wife, Michael Singleton, she does not want him to suffer and has elected to pursue comfort focused goals at this time. Discussed with palliative care. -turn off ICD (palliative care will assist in arranging this) -palliative care to place fentanyl gtt orders. prn morphine, haldol, ativan for comfort.  -no lab draws -may have ice chips for comfort -Wife to visit at EOL  #Acute hypoxic respiratory failure due to COVID 19 Pneumonia. Wean off BiPAP/oxygen once wife has arrived. Discontinue baricitanib and solumedrol.  #Acute toxic/ metabolic encephalopathy. ICU delerium. Fentanyl gtt, prn haldol and ativan for comfort.  #Pressure ulcer to bridge of nose--thought to be 2/2 to bipap mask. Continue dressing changes.  #Hypernatremia. Discontinue lab checks/tube feeds.  #Steroid induced Hyperglycemia.  Discontinue CBGs and insulin.  #Hypertension/ CAD.   #Hypothyroidism.  Best practice:  CODE STATUS: Comfort Diet: comfort feeds DVT for prophylaxis: n/a Dispo: ICU   Mitzi Hansen, MD Internal Medicine Resident PGY-2 [facility] Pager: [phone] Dec 16, 2020 1:39 PM    Please use the IMTS after hours pager at [phone] after 5pm M-F and on weekends

## 2020-11-23 NOTE — Death Summary Note (Addendum)
°  Name: Michael Singleton MRN: [id number] DOB: 06-Feb-1956 64 y.o.  Date of Admission: 11/20/2020  7:41 AM Date of Discharge: 12/17/20 Attending Physician: Dr. Rebeca Alert  Discharge Diagnosis: Principal Problem:   Pneumonia due to COVID-19 virus Active Problems:   Hypothyroidism   CAD (coronary artery disease)   Acute respiratory failure with hypoxia ([place])   Palliative care by specialist   DNR (do not resuscitate)   Metabolic encephalopathy   Hypernatremia   Steroid-induced hyperglycemia   Ventricular tachycardia (HCC)   Pressure ulcer   Cause of death: Acute respiratory failure secondary to COVID 19 pneumonia Time of death: 12/17/20 1517PM  Disposition:   Mr.Jamol Brault was discharged from [facility] in expired condition.    Hospital Course: Duffy Dantonio is a 64 year old male with hypertension, hyperlipidemia, CAD, and history of CVA, NSTEMI, Ventricular tachycardia s/p ICD, and lymphoma.  He presented to [facility] emergency department on 11/16/2020 and was found to have COVID-19 pneumonia. During his hospitalization, he developed acute respiratory failure requiring transfer to the ICU on 11/12/20. He was transitioned to DNR/DNI. He remained on heated high flow nasal canula and a non-rebreather mask with intermittent BiPAP. His condition continued to deteriorate, with the development of ventricular tachycardia,  and he was transitioned to comfort care 12/17/2020 in accordance with the wishes of his wife. He passed away peacefully at 1 PM with his wife at bedside.  Signed: Mitzi Hansen, MD Dec 17, 2020, 5:28 PM

## 2020-11-23 NOTE — Progress Notes (Signed)
OT Cancellation Note  Patient Details Name: Michael Singleton MRN: [id number] DOB: [birth year]   Cancelled Treatment:    Reason Eval/Treat Not Completed: Other (comment) (Pt going comfort care and orders cancelled, OT will respectfully sign off.)  Zenovia Jarred, MSOT, OTR/L [facility] Office Number: [phone] Pager: [phone]  Zenovia Jarred 12-05-2020, 12:31 PM

## 2020-11-23 NOTE — Progress Notes (Signed)
Daily Progress Note   Patient Name: Michael Singleton       Date: 12/06/20 DOB: [birth year]  Age: 64 y.o. MRN#: [id number] Attending Physician: Sid Falcon, MD Primary Care Physician: [facility] Admit Date: 10/27/2020  Reason for Consultation/Follow-up: Establishing goals of care, Non pain symptom management, Pain control, Psychosocial/spiritual support, Terminal Care and Withdrawal of life-sustaining treatment  Subjective: Chart review performed. Received report from Dr. Darrick Meigs - patient had several vtach events this morning, which required ICD shock. She spoke with the patient's significant other who decided to transition the patient to comfort care - significant other is on her way to the hospital.  Patient was lying in bed - no family/visitors present. No signs or non-verbal gestures of pain or discomfort noted. No respiratory distress, increased work of breathing, or secretions noted.   Called [employer] to have patient's ICD turned off.   12:10PM Spoke with RN to discuss plan of care for EOL symptom management - will allow time for family to arrive before weaning oxygen - patient will likely pass away shortly after O2 is removed.  Length of Stay: 9  Current Medications: Scheduled Meds:  . aspirin  81 mg Per Tube Daily  . atorvastatin  80 mg Per Tube QHS  . baricitinib  4 mg Per Tube Daily  . chlorhexidine  15 mL Mouth Rinse BID  . Chlorhexidine Gluconate Cloth  6 each Topical Daily  . clopidogrel  75 mg Per Tube Q breakfast  . enoxaparin (LOVENOX) injection  55 mg Subcutaneous Daily  . feeding supplement (PROSource TF)  45 mL Per Tube TID  . free water  200 mL Per Tube Q4H  . insulin aspart  0-20 Units Subcutaneous Q4H  . insulin aspart  8 Units Subcutaneous  Q4H  . insulin glargine  40 Units Subcutaneous Daily  . levothyroxine  200 mcg Per Tube Q0600  . mouth rinse  15 mL Mouth Rinse q12n4p  . methylPREDNISolone (SOLU-MEDROL) injection  20 mg Intravenous Q12H   Followed by  . [START ON 11/21/2020] methylPREDNISolone (SOLU-MEDROL) injection  20 mg Intravenous Daily  . metoprolol tartrate  25 mg Per Tube BID  . pantoprazole (PROTONIX) IV  40 mg Intravenous Q24H  . polyethylene glycol  17 g Per Tube Daily  . QUEtiapine  50 mg Per Tube  QHS  . senna-docusate  1 tablet Per Tube BID  . traZODone  200 mg Per Tube QHS    Continuous Infusions: . feeding supplement (VITAL 1.5 CAL) 1,000 mL (10-Dec-2020 0347)    PRN Meds: acetaminophen, haloperidol lactate, hydrALAZINE, ondansetron **OR** ondansetron (ZOFRAN) IV  Physical Exam Constitutional:      General: He is not in acute distress.    Appearance: He is ill-appearing.  Pulmonary:     Effort: No respiratory distress.  Neurological:     Mental Status: He is lethargic.     Motor: Weakness present.  Psychiatric:        Cognition and Memory: Cognition is impaired. Memory is impaired.             Vital Signs: BP (!) 114/59   Pulse (!) 126   Temp 98.8 F (37.1 C) (Axillary)   Resp (!) 40   Ht 5\' 5"  (1.651 m)   Wt 103.3 kg   SpO2 (!) 86%   BMI 37.90 kg/m  SpO2: SpO2: (!) 86 % O2 Device: O2 Device: High Flow Nasal Cannula, NRB O2 Flow Rate: O2 Flow Rate (L/min): 50 L/min  Intake/output summary:   Intake/Output Summary (Last 24 hours) at 12-10-2020 1112 Last data filed at 2020/12/10 1000 Gross per 24 hour  Intake 2040 ml  Output 1200 ml  Net 840 ml   LBM: Last BM Date:  (PTA) Baseline Weight: Weight: 111.9 kg Most recent weight: Weight: 103.3 kg       Palliative Assessment/Data: PPS 10%      Patient Active Problem List   Diagnosis Date Noted  . Acute respiratory failure with hypoxia ([place])   . Palliative care by specialist   . Pneumonia due to COVID-19 virus 11/03/2020    . 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 Displacement of atrial pacemaker leads 03/17/2018  . Status post above knee amputation of left lower extremity 08/05/2017  . 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 Wide-complex tachycardia ([place])   . Chest pain 06/05/2016  . Systolic murmur 75/09/2584  . Vitamin D deficiency 10/12/2013  . NHL (non-Hodgkin's lymphoma) ([place]) 10/09/2013  . Dizziness 08/12/2013  . Fatigue 08/12/2013  . Unstable angina ([place]) 08/06/2013  . CAD (coronary artery disease)   . Intermediate coronary syndrome ([place]) 08/04/2013  . Ventricular bigeminy 08/04/2013  . Hypogonadism, male 06/28/2011  . LYMPHOMA, HEAD/NECK/FACE 11/01/2010  . Posttraumatic stress disorder 09/04/2010  . Inguinal hernia, left 09/04/2010  . [place] DISEASE, LUMBAR 09/04/2010  . DDD (degenerative disc disease), lumbosacral 09/04/2010  . OCCLUSION&STENOS CAROTID ART W/O MENTION INFARCT 05/31/2010  . Allergic rhinitis 12/19/2009  . ELBOW PAIN, RIGHT 03/08/2009  . KNEE PAIN, BILATERAL 03/08/2009  . POLYARTHRITIS 03/08/2009  . Low back pain 03/08/2009  . Pain in joint involving lower leg 03/08/2009  . Pain in joints 03/08/2009  . 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 Hypothyroidism 02/02/2008  . Obstructive sleep apnea 02/02/2008  . MYOCARDIAL INFARCTION, HX OF 02/02/2008  . DIVERTICULOSIS, COLON 02/02/2008  . Diverticulosis of colon 02/02/2008  . Hyperlipidemia 07/23/2007  . Essential hypertension 07/23/2007  . CORONARY ARTERY DISEASE 07/23/2007  . Atherosclerotic heart disease of native coronary artery without angina pectoris 07/23/2007    Palliative Care Assessment & Plan   Patient Profile: 64 y/o, un-vaccinated, male with hx HTN, non-hodgkins lymphoma, CVA, CAD, PAD s/p L AKA, HTN initially admitted 11/18 with COVID PNA. He was treated with baricitinib, remdesivir and dexamethasone. On 11/20 he had progressive dyspnea and hypoxia  requiring 50L heated HFNC  plus NRB with sats still 70's. ICU transfer for higher level of care. 11/25: delirium improved, precedex off, afebrile, patient awake and asking for water. Requiring 35L HFNC. Palliative medicine consultation for support.   Assessment: Pneumonia due to COVID19  Acute respiratory failure with hypoxia Metabolic encephalopathy Pressure ulcer to bridge of nose Hypernatremia Hypertension Ventricular tachycardia  Recommendations/Plan: Continue full comfort measures Continue DNR/DNI as previously documented After oxygen support is weaned, patient will likely not be stable enough for transfer to residential hospice - anticipate hospital death, likely within hours after oxygen is removed Added orders for EOL symptom management and to reflect full comfort measures, as well as discontinued orders that were not focused on comfort Unrestricted visitation orders were placed per current [place] EOL visitation policy  Provide frequent assessments and administer PRN medications as clinically necessary to ensure EOL comfort PMT will continue to follow holistically   Goals of Care and Additional Recommendations:  Limitations on Scope of Treatment: Full Comfort Care  Code Status:    Code Status Orders  (From admission, onward)         Start     Ordered   11/15/20 0938  Do not attempt resuscitation (DNR)  Continuous       Question Answer Comment  In the event of cardiac or respiratory ARREST Do not call a "code blue"   In the event of cardiac or respiratory ARREST Do not perform Intubation, CPR, defibrillation or ACLS   In the event of cardiac or respiratory ARREST Use medication by any route, position, wound care, and other measures to relive pain and suffering. May use oxygen, suction and manual treatment of airway obstruction as needed for comfort.   Comments DNR / DNI      11/15/20 0938        Code Status History    Date Active Date Inactive Code Status Order ID Comments User  Context   11/09/2020 1221 11/15/2020 0938 Full Code [id number]  Mosetta Anis, MD ED   03/17/2018 2030 03/18/2018 1450 Full Code [id number]  Evans Lance, MD Inpatient   06/14/2017 2014 06/19/2017 1257 Full Code [id number]  Ledora Bottcher, PA Inpatient   Advance Care Planning Activity       Prognosis:   Hours - Days  Discharge Planning:  Anticipated Hospital Death  Care plan was discussed with Dr. Darrick Meigs, primary RN  Thank you for allowing the Palliative Medicine Team to assist in the care of this patient.   Total Time 35 minutes Prolonged Time Billed  no       Greater than 50%  of this time was spent counseling and coordinating care related to the above assessment and plan.  Lin Landsman, NP  Please contact Palliative Medicine Team phone at [phone] for questions and concerns.

## 2020-11-23 DEATH — deceased

## 2024-06-24 ENCOUNTER — Other Ambulatory Visit: Payer: Self-pay | Admitting: Internal Medicine

## 2024-06-30 ENCOUNTER — Other Ambulatory Visit: Payer: Self-pay | Admitting: Internal Medicine
